# Patient Record
Sex: Male | Born: 1975 | Race: White | Hispanic: No | Marital: Married | Smoking: Current every day smoker
Health system: Southern US, Community
[De-identification: ages and names within clinical notes are randomized; demographics above are authoritative.]

## PROBLEM LIST (undated history)

## (undated) DIAGNOSIS — F419 Anxiety disorder, unspecified: Secondary | ICD-10-CM

## (undated) DIAGNOSIS — D689 Coagulation defect, unspecified: Secondary | ICD-10-CM

## (undated) DIAGNOSIS — C801 Malignant (primary) neoplasm, unspecified: Secondary | ICD-10-CM

## (undated) DIAGNOSIS — Z5189 Encounter for other specified aftercare: Secondary | ICD-10-CM

## (undated) DIAGNOSIS — T7840XA Allergy, unspecified, initial encounter: Secondary | ICD-10-CM

## (undated) DIAGNOSIS — I499 Cardiac arrhythmia, unspecified: Secondary | ICD-10-CM

## (undated) DIAGNOSIS — M1990 Unspecified osteoarthritis, unspecified site: Secondary | ICD-10-CM

## (undated) DIAGNOSIS — F32A Depression, unspecified: Secondary | ICD-10-CM

## (undated) DIAGNOSIS — G709 Myoneural disorder, unspecified: Secondary | ICD-10-CM

## (undated) DIAGNOSIS — E618 Deficiency of other specified nutrient elements: Secondary | ICD-10-CM

## (undated) DIAGNOSIS — I10 Essential (primary) hypertension: Secondary | ICD-10-CM

## (undated) DIAGNOSIS — F4310 Post-traumatic stress disorder, unspecified: Secondary | ICD-10-CM

## (undated) DIAGNOSIS — I480 Paroxysmal atrial fibrillation: Secondary | ICD-10-CM

## (undated) HISTORY — DX: Myoneural disorder, unspecified: G70.9

## (undated) HISTORY — DX: Malignant (primary) neoplasm, unspecified: C80.1

## (undated) HISTORY — PX: FRACTURE SURGERY: SHX138

## (undated) HISTORY — DX: Paroxysmal atrial fibrillation: I48.0

## (undated) HISTORY — DX: Coagulation defect, unspecified: D68.9

## (undated) HISTORY — PX: SPLENECTOMY, TOTAL: SHX788

## (undated) HISTORY — PX: JOINT REPLACEMENT: SHX530

## (undated) HISTORY — DX: Cardiac arrhythmia, unspecified: I49.9

## (undated) HISTORY — DX: Unspecified osteoarthritis, unspecified site: M19.90

## (undated) HISTORY — DX: Deficiency of other specified nutrient elements: E61.8

## (undated) HISTORY — DX: Encounter for other specified aftercare: Z51.89

## (undated) HISTORY — DX: Depression, unspecified: F32.A

## (undated) HISTORY — DX: Allergy, unspecified, initial encounter: T78.40XA

## (undated) HISTORY — DX: Post-traumatic stress disorder, unspecified: F43.10

## (undated) HISTORY — DX: Essential (primary) hypertension: I10

## (undated) HISTORY — PX: SPINAL FUSION: SHX223

## (undated) HISTORY — PX: APPENDECTOMY: SHX54

## (undated) HISTORY — PX: EYE SURGERY: SHX253

---

## 2020-06-22 ENCOUNTER — Emergency Department
Admission: EM | Admit: 2020-06-22 | Discharge: 2020-06-22 | Disposition: A | Discharge status: Home / Self Care | Payer: Medicaid Other | Attending: Emergency Medicine | Admitting: Emergency Medicine

## 2020-06-22 ENCOUNTER — Emergency Department: Payer: Medicaid Other

## 2020-06-22 ENCOUNTER — Other Ambulatory Visit: Payer: Self-pay

## 2020-06-22 DIAGNOSIS — M79662 Pain in left lower leg: Secondary | ICD-10-CM | POA: Diagnosis present

## 2020-06-22 DIAGNOSIS — F17200 Nicotine dependence, unspecified, uncomplicated: Secondary | ICD-10-CM | POA: Diagnosis not present

## 2020-06-22 DIAGNOSIS — F1210 Cannabis abuse, uncomplicated: Secondary | ICD-10-CM | POA: Diagnosis not present

## 2020-06-22 DIAGNOSIS — M722 Plantar fascial fibromatosis: Secondary | ICD-10-CM

## 2020-06-22 HISTORY — DX: Anxiety disorder, unspecified: F41.9

## 2020-06-22 MED ORDER — MELOXICAM 15 MG PO TABS: 15.0000 mg | ORAL_TABLET | Freq: Every day | ORAL | 0 refills | Status: DC

## 2020-06-22 MED ORDER — CYCLOBENZAPRINE HCL 10 MG PO TABS
10.0000 mg | ORAL_TABLET | Freq: Once | ORAL | Status: AC
  Administered 2020-06-22: 10 mg via ORAL
  Filled 2020-06-22: qty 1

## 2020-06-22 MED ORDER — NICOTINE 14 MG/24HR TD PT24
14.0000 mg | MEDICATED_PATCH | Freq: Once | TRANSDERMAL | Status: DC
  Administered 2020-06-22: 14 mg via TRANSDERMAL
  Filled 2020-06-22: qty 1

## 2020-06-22 MED ORDER — KETOROLAC TROMETHAMINE 30 MG/ML IJ SOLN
30.0000 mg | Freq: Once | INTRAMUSCULAR | Status: AC
  Administered 2020-06-22: 30 mg via INTRAMUSCULAR
  Filled 2020-06-22: qty 1

## 2020-06-22 MED ORDER — CYCLOBENZAPRINE HCL 10 MG PO TABS: 5.0000 mg | ORAL_TABLET | Freq: Three times a day (TID) | ORAL | 0 refills | Status: DC | PRN

## 2020-06-22 NOTE — ED Provider Notes (Signed)
[facility] Emergency Department Provider Note ____________________________________________  Time seen: Approximately 11:37 AM  I have reviewed the triage vital signs and the nursing notes.   HISTORY  Chief Complaint Foot Pain    HPI Patrick Short is a 44 y.o. male who presents to the emergency department for evaluation and treatment of left lower extremity pain.  No new injury.  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  Patient states that he has been septic 3 times after having had last surgery several years ago.  He states that for the past 2 days he has had to bear weight on his tiptoes and has been unable to plant his foot and walk normally.  Patient states that he has not taken any medications.  He says that he was on opiates for several years and it took him "3 years" to get off of them.  He denies fever, chills, erythema.   Past Medical History:  Diagnosis Date  . Anxiety     There are no problems to display for this patient.   History reviewed. No pertinent surgical history.  Prior to Admission medications   Medication Sig Start Date End Date Taking? Authorizing Provider  cyclobenzaprine (FLEXERIL) 10 MG tablet Take 0.5 tablets (5 mg total) by mouth 3 (three) times daily as needed for muscle spasms. 06/22/20   Madox Corkins, Rulon Eisenmenger B, FNP  meloxicam (MOBIC) 15 MG tablet Take 1 tablet (15 mg total) by mouth daily. 06/22/20   Shantrice Rodenberg, Rulon Eisenmenger B, FNP    Allergies Aspirin, Sulfa antibiotics, and Penicillins  No family history on file.  Social History Social History   Tobacco Use  . Smoking status: Current Every Day Smoker  . Smokeless tobacco: Never Used  Substance Use Topics  . Alcohol use: Not Currently  . Drug use: Yes    Types: Marijuana    Review of Systems Constitutional: Negative for fever. Cardiovascular: Negative for chest pain. Respiratory:  Negative for shortness of breath. Musculoskeletal: Positive for left lower extremity pain. Skin: Negative for open wounds Neurological: Negative for decrease in sensation  ____________________________________________   PHYSICAL EXAM:  VITAL SIGNS: ED Triage Vitals [06/22/20 1004]  Enc Vitals Group     BP (!) 160/97     Pulse Rate 100     Resp 18     Temp 98 F (36.7 C)     Temp src      SpO2 100 %     Weight 150 lb (68 kg)     Height 5\' 7"  (1.702 m)     Head Circumference      Peak Flow      Pain Score 10     Pain Loc      Pain Edu?      Excl. in GC?     Constitutional: Alert and oriented. Well appearing and in no acute distress. Eyes: Conjunctivae are clear without discharge or drainage Head: Atraumatic Neck: Supple. Respiratory: No cough. Respirations are even and unlabored. Musculoskeletal: Limited extension of the left lower extremity at the knee secondary to pain.  No deformity of the left lower extremity.  Full range of motion of the left ankle demonstrated.  Toes of the left foot without deformity or decrease in range of motion. Neurologic: Motor and sensory function is intact.  Strength is 5 out of 5 in the lower extremities. Skin: No swelling, erythema, focal tenderness over the  left lower extremity. Psychiatric: Affect and behavior are appropriate.  ____________________________________________   LABS (all labs ordered are listed, but only abnormal results are displayed)  Labs Reviewed - No data to display ____________________________________________  RADIOLOGY  Image of the left femur and tibia/fibula are negative for acute findings.  Image of the left foot does show a heel spur but otherwise negative for anything acute.  Ultrasound of the left lower extremity is negative for DVT.  I, Kem Boroughs, personally viewed and evaluated these images (plain radiographs) as part of my medical decision making, as well as reviewing the written report by the  radiologist.  DG Tibia/Fibula Left  Result Date: 06/22/2020 CLINICAL DATA:  Acute on chronic left leg pain EXAM: LEFT TIBIA AND FIBULA - 2 VIEW; LEFT FEMUR 2 VIEWS COMPARISON:  None. FINDINGS: There is no evidence of fracture or other focal bone lesions. Joint spaces of the hip, knee, and ankle are preserved. Soft tissues are unremarkable. IMPRESSION: Negative. Electronically Signed   By: Duanne Guess D.O.   On: 06/22/2020 12:04   US Venous Img Lower Unilateral Left  Result Date: 06/22/2020 CLINICAL DATA:  Calf pain EXAM: Left LOWER EXTREMITY VENOUS DOPPLER ULTRASOUND TECHNIQUE: Gray-scale sonography with compression, as well as color and duplex ultrasound, were performed to evaluate the deep venous system(s) from the level of the common femoral vein through the popliteal and proximal calf veins. COMPARISON:  None. FINDINGS: VENOUS Normal compressibility of the common femoral, superficial femoral, and popliteal veins, as well as the visualized calf veins. Visualized portions of profunda femoral vein and great saphenous vein unremarkable. No filling defects to suggest DVT on grayscale or color Doppler imaging. Doppler waveforms show normal direction of venous flow, normal respiratory plasticity and response to augmentation. Limited views of the contralateral common femoral vein are unremarkable. IMPRESSION: Negative. Electronically Signed   By: Feliberto Harts MD   On: 06/22/2020 12:20   DG Foot Complete Left  Result Date: 06/22/2020 CLINICAL DATA:  Pain with weight-bearing. EXAM: LEFT FOOT - COMPLETE 3+ VIEW COMPARISON:  No prior. FINDINGS: No acute bony or joint abnormality. No evidence of fracture or dislocation. Tiny benign-appearing cyst noted in the central portion of the calcaneus on lateral view. No radiopaque foreign body. IMPRESSION: No acute abnormality. Electronically Signed   By: Maisie Fus  Register   On: 06/22/2020 13:12   DG Femur Min 2 Views Left  Result Date: 06/22/2020 CLINICAL  DATA:  Acute on chronic left leg pain EXAM: LEFT TIBIA AND FIBULA - 2 VIEW; LEFT FEMUR 2 VIEWS COMPARISON:  None. FINDINGS: There is no evidence of fracture or other focal bone lesions. Joint spaces of the hip, knee, and ankle are preserved. Soft tissues are unremarkable. IMPRESSION: Negative. Electronically Signed   By: Duanne Guess D.O.   On: 06/22/2020 12:04   ____________________________________________   PROCEDURES  Procedures  ____________________________________________   INITIAL IMPRESSION / ASSESSMENT AND PLAN / ED COURSE  Patrick Short is a 44 y.o. who presents to the emergency department for treatment and evaluation of left lower extremity pain.  See HPI for further details.    Plan today will be to get plain films as well as ultrasound. 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 He does not want any type of opiate but would like to try toradol.  Differential diagnosis includes but is not limited to: DVT, osteomyelitis, musculoskeletal strain, plantar fasciitis, stress fracture.  Images are all reassuring.  He does have a small heel spur which may be causing some plantar fasciitis.  He has no signs of sepsis today.  Normotensive, not tachypneic, SPO2 above 95, heart rate under 100.  He did improve with the Toradol.  Plan will be to submit prescriptions for meloxicam and Flexeril to his pharmacy.  Cam walker applied.  He will be encouraged to follow-up with podiatry.  Medications  nicotine (NICODERM CQ - dosed in mg/24 hours) patch 14 mg (14 mg Transdermal Patch Applied 06/22/20 1241)  ketorolac (TORADOL) 30 MG/ML injection 30 mg (30 mg Intramuscular Given 06/22/20 1226)  cyclobenzaprine (FLEXERIL) tablet 10 mg (10 mg Oral Given 06/22/20 1337)    Pertinent labs & imaging results that were available during my care of the patient were reviewed by me and considered in my medical decision making (see chart for  details).   _________________________________________   FINAL CLINICAL IMPRESSION(S) / ED DIAGNOSES  Final diagnoses:  Plantar fasciitis, left    ED Discharge Orders         Ordered    cyclobenzaprine (FLEXERIL) 10 MG tablet  3 times daily PRN        06/22/20 1343    meloxicam (MOBIC) 15 MG tablet  Daily        06/22/20 1343           If controlled substance prescribed during this visit, 12 month history viewed on the [facility] prior to issuing an initial prescription for Schedule II or III opiod.   Chinita Pester, FNP 06/22/20 1356    Gilles Chiquito, MD 06/22/20 [phone]

## 2020-06-22 NOTE — ED Triage Notes (Signed)
Pt comes via POV from home with c/o left foot and heel pain. Pt states this started 2 days ago.

## 2020-06-22 NOTE — Discharge Instructions (Signed)
Please follow up with podiatry.   Take the medication as prescribed.

## 2020-11-19 ENCOUNTER — Emergency Department: Payer: Medicaid Other

## 2020-11-19 ENCOUNTER — Emergency Department
Admission: EM | Admit: 2020-11-19 | Discharge: 2020-11-19 | Disposition: A | Discharge status: Home / Self Care | Payer: Medicaid Other | Attending: Emergency Medicine | Admitting: Emergency Medicine

## 2020-11-19 ENCOUNTER — Other Ambulatory Visit: Payer: Self-pay

## 2020-11-19 DIAGNOSIS — M5442 Lumbago with sciatica, left side: Secondary | ICD-10-CM | POA: Insufficient documentation

## 2020-11-19 DIAGNOSIS — F17200 Nicotine dependence, unspecified, uncomplicated: Secondary | ICD-10-CM | POA: Insufficient documentation

## 2020-11-19 DIAGNOSIS — M5441 Lumbago with sciatica, right side: Secondary | ICD-10-CM | POA: Diagnosis not present

## 2020-11-19 MED ORDER — METHOCARBAMOL 750 MG PO TABS: 750.0000 mg | ORAL_TABLET | Freq: Four times a day (QID) | ORAL | 0 refills | Status: AC | PRN

## 2020-11-19 MED ORDER — KETOROLAC TROMETHAMINE 60 MG/2ML IM SOLN
30.0000 mg | Freq: Once | INTRAMUSCULAR | Status: AC
  Administered 2020-11-19: 30 mg via INTRAMUSCULAR
  Filled 2020-11-19: qty 2

## 2020-11-19 MED ORDER — ACETAMINOPHEN 325 MG PO TABS
650.0000 mg | ORAL_TABLET | Freq: Once | ORAL | Status: AC
  Administered 2020-11-19: 650 mg via ORAL
  Filled 2020-11-19: qty 2

## 2020-11-19 MED ORDER — METHOCARBAMOL 500 MG PO TABS
750.0000 mg | ORAL_TABLET | Freq: Once | ORAL | Status: AC
  Administered 2020-11-19: 750 mg via ORAL
  Filled 2020-11-19: qty 2
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
## 2020-11-19 NOTE — ED Notes (Signed)
See triage note  Presents with lower back pain  States pain started last pm  Denies any injury  States pain is moving into both legs  Ambulates slowly d/t increased apin

## 2020-11-19 NOTE — Discharge Instructions (Addendum)
Take tylenol up to 4x daily as needed for pain. Use robaxin as prescribed. Do not drive or operate machinery on this medication.

## 2020-11-19 NOTE — ED Triage Notes (Addendum)
Pt c/o lower back pain that radiates into BL LE since last night, denies any injury. Pt is ambulatory to triage with a steady gait. Pt states he is suppose to be In court today at 1pm and is wanting up to fax a note.

## 2021-01-08 ENCOUNTER — Encounter: Payer: Self-pay | Admitting: Nurse Practitioner

## 2021-01-08 ENCOUNTER — Other Ambulatory Visit: Payer: Self-pay

## 2021-01-08 ENCOUNTER — Ambulatory Visit (INDEPENDENT_AMBULATORY_CARE_PROVIDER_SITE_OTHER): Payer: Medicaid Other | Admitting: Nurse Practitioner

## 2021-01-08 VITALS — BP 158/90 | HR 78 | Ht 65.55 in | Wt 173.4 lb

## 2021-01-08 DIAGNOSIS — F419 Anxiety disorder, unspecified: Secondary | ICD-10-CM

## 2021-01-08 DIAGNOSIS — F3289 Other specified depressive episodes: Secondary | ICD-10-CM | POA: Diagnosis not present

## 2021-01-08 DIAGNOSIS — I10 Essential (primary) hypertension: Secondary | ICD-10-CM

## 2021-01-08 DIAGNOSIS — Z720 Tobacco use: Secondary | ICD-10-CM

## 2021-01-08 DIAGNOSIS — Z7689 Persons encountering health services in other specified circumstances: Secondary | ICD-10-CM | POA: Diagnosis not present

## 2021-01-08 MED ORDER — VALSARTAN-HYDROCHLOROTHIAZIDE 80-12.5 MG PO TABS: 1.0000 | ORAL_TABLET | Freq: Every day | ORAL | 0 refills | Status: DC
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
## 2021-01-09 DIAGNOSIS — F32A Depression, unspecified: Secondary | ICD-10-CM | POA: Insufficient documentation

## 2021-01-09 DIAGNOSIS — F419 Anxiety disorder, unspecified: Secondary | ICD-10-CM | POA: Insufficient documentation

## 2021-01-09 DIAGNOSIS — I10 Essential (primary) hypertension: Secondary | ICD-10-CM | POA: Insufficient documentation

## 2021-01-09 NOTE — Assessment & Plan Note (Signed)
Chronic.  Will discuss smoking cessation at next visit. Return in one month.

## 2021-01-09 NOTE — Assessment & Plan Note (Signed)
New Problem. Recommend starting Valsartan-HCTZ daily to help control blood pressure.  Continue to track blood pressures at home and bring log to next appointment.  Return in 1 month.

## 2021-01-09 NOTE — Assessment & Plan Note (Signed)
Controlled. Will possibly start Wellbutrin at future visits to help with Depression/Anxiety and smoking cessation.  Can return sooner if symptoms worsen.  Denies SI.  Return in 1 month for reevaluation.

## 2021-01-09 NOTE — Assessment & Plan Note (Signed)
Controlled.  Continue with anxiety management techniques. Will possibly try Wellbutrin in the future to help with smoker cessation and Anxiety/Depression.  Return in 1 month for reevaluation.

## 2021-01-14 DIAGNOSIS — Z20822 Contact with and (suspected) exposure to covid-19: Secondary | ICD-10-CM | POA: Diagnosis not present

## 2021-01-16 ENCOUNTER — Telehealth (INDEPENDENT_AMBULATORY_CARE_PROVIDER_SITE_OTHER): Payer: Medicaid Other | Admitting: Nurse Practitioner

## 2021-01-16 ENCOUNTER — Other Ambulatory Visit: Payer: Self-pay

## 2021-01-16 ENCOUNTER — Encounter: Payer: Self-pay | Admitting: Nurse Practitioner

## 2021-01-16 ENCOUNTER — Other Ambulatory Visit: Payer: Self-pay | Admitting: Internal Medicine

## 2021-01-16 ENCOUNTER — Telehealth: Payer: Self-pay | Admitting: Nurse Practitioner

## 2021-01-16 DIAGNOSIS — J069 Acute upper respiratory infection, unspecified: Secondary | ICD-10-CM | POA: Diagnosis not present

## 2021-01-16 MED ORDER — HYDROCOD POLST-CPM POLST ER 10-8 MG/5ML PO SUER
5.0000 mL | Freq: Two times a day (BID) | ORAL | 0 refills | Status: DC | PRN
Start: 2021-01-16 — End: 2021-01-28

## 2021-01-16 MED ORDER — GUAIFENESIN-CODEINE 100-10 MG/5ML PO SOLN: 5.0000 mL | Freq: Two times a day (BID) | ORAL | 0 refills | Status: DC | PRN

## 2021-01-16 MED ORDER — GUAIFENESIN-CODEINE 100-10 MG/5ML PO SOLN: 5.0000 mL | Freq: Four times a day (QID) | ORAL | 0 refills | Status: DC | PRN

## 2021-01-16 NOTE — Progress Notes (Signed)
There were no vitals taken for this visit.   Subjective:    Patient ID: Patrick Short, male    DOB: Oct 01, 1976, 45 y.o.   MRN: [id number]  HPI: Patrick Short is a 45 y.o. male  Chief Complaint  Patient presents with  . covid symptoms    Severe congestion, cough, hot and cold flashes   UPPER RESPIRATORY TRACT INFECTION COVID test was negative on Monday Worst symptom: congestion, cough, hot and cold flashes Fever: no Cough: yes Shortness of breath: yes from cough Wheezing: yes Chest pain: yes, with cough Chest tightness: no Chest congestion: yes Nasal congestion: yes Runny nose: yes Post nasal drip: yes Sneezing: no Sore throat: yes Swollen glands: no Sinus pressure: no Headache: yes Face pain: no Toothache: no Ear pain: no bilateral Ear pressure: no bilateral Eyes red/itching:no Eye drainage/crusting: no  Vomiting: yes Rash: no Fatigue: yes Sick contacts: no Strep contacts: no  Context: stable Recurrent sinusitis: no Relief with OTC cold/cough medications: no  Treatments attempted: benadruyl, nyquil, dayquil, tylneol/motrin   Relevant past medical, surgical, family and social history reviewed and updated as indicated. Interim medical history since our last visit reviewed. Allergies and medications reviewed and updated.  Review of Systems  Constitutional: Positive for fatigue. Negative for fever.  HENT: Positive for congestion, postnasal drip and rhinorrhea. Negative for ear pain, sinus pressure, sinus pain, sneezing and sore throat.   Respiratory: Positive for cough, shortness of breath and wheezing. Negative for chest tightness.   Gastrointestinal: Positive for vomiting.  Skin: Negative for rash.  Neurological: Negative for headaches.    Per HPI unless specifically indicated above     Objective:    There were no vitals taken for this visit.  Wt Readings from Last 3 Encounters:  01/08/21 173 lb 6 oz (78.6 kg)  11/19/20 160 lb (72.6 kg)  06/22/20 150  lb (68 kg)    Physical Exam Vitals and nursing note reviewed.  Constitutional:      General: He is not in acute distress.    Appearance: He is not ill-appearing.  HENT:     Head: Normocephalic.     Right Ear: Hearing normal.     Left Ear: Hearing normal.     Nose: Nose normal.  Pulmonary:     Effort: Pulmonary effort is normal. No respiratory distress.  Neurological:     Mental Status: He is alert.  Psychiatric:        Mood and Affect: Mood normal.        Behavior: Behavior normal.        Thought Content: Thought content normal.        Judgment: Judgment normal.     No results found for this or any previous visit.    Assessment & Plan:   Problem List Items Addressed This Visit   None   Visit Diagnoses    Upper respiratory tract infection, unspecified type    -  Primary   Retest patient on Friday for COVID per the recommendations of health dept. Continue with symptom management. Discussed s/s of when to seek higher level of care.   Relevant Orders   Novel Coronavirus, NAA ([facility])       Follow up plan: Return if symptoms worsen or fail to improve.     This visit was completed via MyChart due to the restrictions of the COVID-19 pandemic. All issues as above were discussed and addressed. Physical exam was done as above through visual confirmation on MyChart.  If it was felt that the patient should be evaluated in the office, they were directed there. The patient verbally consented to this visit. 1. Location of the patient: Home 2. Location of the provider: Office 3. Those involved with this call:  ? Provider: Larae Grooms, NP ? CMA: Tiffany Reel, CMA ? Front Desk/Registration: Harriet Pho 4. Time spent on call: 15 minutes with patient face to face via video conference. More than 50% of this time was spent in counseling and coordination of care. 20 minutes total spent in review of patient's record and preparation of their chart.

## 2021-01-16 NOTE — Progress Notes (Signed)
Sent in tussionex for pt , seen Larae Grooms for an URTI

## 2021-01-16 NOTE — Telephone Encounter (Signed)
Orders placed.

## 2021-01-18 ENCOUNTER — Other Ambulatory Visit: Payer: Medicaid Other

## 2021-01-18 DIAGNOSIS — J069 Acute upper respiratory infection, unspecified: Secondary | ICD-10-CM | POA: Diagnosis not present

## 2021-01-19 LAB — SARS-COV-2, NAA 2 DAY TAT

## 2021-01-19 LAB — NOVEL CORONAVIRUS, NAA: SARS-CoV-2, NAA: NOT DETECTED

## 2021-01-21 NOTE — Progress Notes (Signed)
Hi Patrick Short.  Your COVID test is negative.  Please let me know if you have any questions.

## 2021-01-25 ENCOUNTER — Telehealth: Payer: Self-pay | Admitting: Nurse Practitioner

## 2021-01-25 NOTE — Telephone Encounter (Signed)
Pt states that he spoke with PCP at last appt regarding the medication Wellbutrin. He states that he would like to have a prescription to help with his anxiety and to help him with smoking. He also states that he is having restless legs and is requesting to have a prescription for that. Please advise .      [id number] - [place], [place] - [address]  [address] [place] [address]  Phone: [phone] Fax: [phone]  Hours: Not open 24 hours

## 2021-01-28 ENCOUNTER — Encounter: Payer: Self-pay | Admitting: Nurse Practitioner

## 2021-01-28 ENCOUNTER — Ambulatory Visit (INDEPENDENT_AMBULATORY_CARE_PROVIDER_SITE_OTHER): Payer: Medicaid Other | Admitting: Nurse Practitioner

## 2021-01-28 ENCOUNTER — Other Ambulatory Visit: Payer: Self-pay

## 2021-01-28 VITALS — BP 126/86 | HR 82 | Temp 97.6°F | Wt 170.0 lb

## 2021-01-28 DIAGNOSIS — G2581 Restless legs syndrome: Secondary | ICD-10-CM

## 2021-01-28 DIAGNOSIS — I10 Essential (primary) hypertension: Secondary | ICD-10-CM

## 2021-01-28 MED ORDER — ROPINIROLE HCL 0.5 MG PO TABS: 0.5000 mg | ORAL_TABLET | Freq: Every day | ORAL | 0 refills | Status: DC

## 2021-01-28 NOTE — Telephone Encounter (Signed)
Please advise pt seen 4/6

## 2021-01-28 NOTE — Telephone Encounter (Signed)
Patient is due to come in at the end of the month for follow up.  We can discuss this at that time or he can come in sooner for an appointment.

## 2021-01-28 NOTE — Progress Notes (Signed)
BP 126/86   Pulse 82   Temp 97.6 F (36.4 C)   Wt 170 lb (77.1 kg) Comment: Patient reported  SpO2 98%   BMI 27.82 kg/m    Subjective:    Patient ID: Patrick Short, male    DOB: 09/15/1976, 45 y.o.   MRN: [id number]  HPI: Patrick Short is a 45 y.o. male  Chief Complaint  Patient presents with  . Anxiety  . Hypertension  . Nicotine Dependence  . restless leg    Patient states that he has been having a lot of issues over the weekend   INSOMNIA  Patient states that he notices that his legs are jumping a lot.  He states it is messing with him mentally.  Duration: years Satisfied with sleep quality: no Difficulty falling asleep: yes Difficulty staying asleep: yes Waking a few hours after sleep onset: yes Early morning awakenings: usually falls asleep in the early morning. Daytime hypersomnolence: yes Wakes feeling refreshed: no Good sleep hygiene: no Apnea: no Snoring: unsure Depressed/anxious mood: yes Recent stress: no Restless legs/nocturnal leg cramps: yes Chronic pain/arthritis: yes History of sleep study: several years ago possibly 2015/2016 Treatments attempted: benadryl   HYPERTENSION Patient has been checking blood pressures at home. States that are running in the 120s/80s.  Denies side effects from medication.  Denies HA, CP, SOB, dizziness, palpitations, visual changes, and lower extremity swelling.   Relevant past medical, surgical, family and social history reviewed and updated as indicated. Interim medical history since our last visit reviewed. Allergies and medications reviewed and updated.  Review of Systems  Eyes: Negative for visual disturbance.  Respiratory: Negative for shortness of breath.   Cardiovascular: Negative for chest pain and leg swelling.  Neurological: Negative for light-headedness and headaches.       Feels like legs are jumping  Psychiatric/Behavioral: Positive for decreased concentration and sleep disturbance. The patient is  nervous/anxious.     Per HPI unless specifically indicated above     Objective:    BP 126/86   Pulse 82   Temp 97.6 F (36.4 C)   Wt 170 lb (77.1 kg) Comment: Patient reported  SpO2 98%   BMI 27.82 kg/m   Wt Readings from Last 3 Encounters:  01/28/21 170 lb (77.1 kg)  01/08/21 173 lb 6 oz (78.6 kg)  11/19/20 160 lb (72.6 kg)    Physical Exam Vitals and nursing note reviewed.  Constitutional:      General: He is not in acute distress.    Appearance: Normal appearance. He is not ill-appearing, toxic-appearing or diaphoretic.  HENT:     Head: Normocephalic.     Right Ear: External ear normal.     Left Ear: External ear normal.     Nose: Nose normal. No congestion or rhinorrhea.     Mouth/Throat:     Mouth: Mucous membranes are moist.  Eyes:     General:        Right eye: No discharge.        Left eye: No discharge.     Extraocular Movements: Extraocular movements intact.     Conjunctiva/sclera: Conjunctivae normal.     Pupils: Pupils are equal, round, and reactive to light.  Cardiovascular:     Rate and Rhythm: Normal rate and regular rhythm.     Heart sounds: No murmur heard.   Pulmonary:     Effort: Pulmonary effort is normal. No respiratory distress.     Breath sounds: Normal breath sounds. No  wheezing, rhonchi or rales.  Abdominal:     General: Abdomen is flat. Bowel sounds are normal.  Musculoskeletal:     Cervical back: Normal range of motion and neck supple.  Skin:    General: Skin is warm and dry.     Capillary Refill: Capillary refill takes less than 2 seconds.  Neurological:     General: No focal deficit present.     Mental Status: He is alert and oriented to person, place, and time.  Psychiatric:        Mood and Affect: Mood normal.        Behavior: Behavior normal.        Thought Content: Thought content normal.        Judgment: Judgment normal.     Results for orders placed or performed in visit on 01/18/21  Novel Coronavirus, NAA ([facility])    Specimen: Saline  Result Value Ref Range   SARS-CoV-2, NAA Not Detected Not Detected  SARS-COV-2, NAA 2 DAY TAT  Result Value Ref Range   SARS-CoV-2, NAA 2 DAY TAT Performed       Assessment & Plan:   Problem List Items Addressed This Visit      Cardiovascular and Mediastinum   Hypertension    Chronic.  Controlled.  Continue with current medication regimen.  Return to clinic in 2 weeks.  Will draw labs at next visit.         Other   Restless leg syndrome - Primary    Begin Requip nightly to help with Restless leg symptoms. Discussed side effects and benefits of medication with patient during visit.  Can increase dose at next visit if necessary.  Can add Mirapex to requip if patient needs additional relief. Return to clinic in two weeks for reevaluation.           Follow up plan: Return in about 2 weeks (around 02/11/2021) for Restless Leg.

## 2021-01-28 NOTE — Assessment & Plan Note (Signed)
Chronic.  Controlled.  Continue with current medication regimen.  Return to clinic in 2 weeks.  Will draw labs at next visit.

## 2021-01-28 NOTE — Telephone Encounter (Signed)
Called pt scheduled him for 1:00 today he states that he is unable to sleep as well

## 2021-01-28 NOTE — Assessment & Plan Note (Addendum)
Begin Requip nightly to help with Restless leg symptoms. Discussed side effects and benefits of medication with patient during visit.  Can increase dose at next visit if necessary.  Can add Mirapex to requip if patient needs additional relief. Return to clinic in two weeks for reevaluation.

## 2021-01-30 ENCOUNTER — Other Ambulatory Visit: Payer: Self-pay | Admitting: Nurse Practitioner

## 2021-02-04 ENCOUNTER — Ambulatory Visit: Payer: Self-pay

## 2021-02-04 NOTE — Telephone Encounter (Signed)
BP med started 1 mo ago. BP WNL then. Saturday in direct sunlight - 2 hours fishing. Hot and dizzy while in sun. Came home. Last night BP 13?/92. Diaphoretic. BP 108/94. More active today and ran errands.    Latest BP: 108/94 just before call. -During triage call:126/92. No dizziness. Is drinking water.Says feels very thirsty.  Advised pt to check BP every day and prn when having symptoms. Advised to call office if noticing fluctuations in BP.   Advised to continue to drink fluids to have the urine a clear or a pale yellow.  Advised to call 911 for weakness/numbness of the face arms or legs, any blurred or double vision.

## 2021-02-05 NOTE — Telephone Encounter (Signed)
Patient notified

## 2021-02-05 NOTE — Telephone Encounter (Signed)
Recommend patient make sure he is well hydrated with at least 64 ounces of water daily.  If he is out in the sun he should increase his water intake.  Continue to check blood pressures at home daily.  Keep a log and bring to next visit so we can discuss.  If patient is light headed, dizzy or blurred vision he should return to the clinic sooner.

## 2021-02-08 ENCOUNTER — Ambulatory Visit: Payer: Medicaid Other | Admitting: Nurse Practitioner

## 2021-02-15 NOTE — Progress Notes (Signed)
BP 128/85   Pulse 67   Temp 98 F (36.7 C)   Wt 172 lb 4 oz (78.1 kg)   SpO2 97%   BMI 28.18 kg/m    Subjective:    Patient ID: Patrick Short, male    DOB: [birth year], 45 y.o.   MRN: [id number]  HPI: Patrick Short is a 45 y.o. male  Chief Complaint  Patient presents with  . restless leg   . Labs Only   INSOMNIA  Patient states his sleep pattern has improved with the Requip.  It is better than it has bene in years. Patient is going to sleep around 11-12 and getting up around 6-7. Duration: years Satisfied with sleep quality: no Difficulty falling asleep: yes Difficulty staying asleep: yes Waking a few hours after sleep onset: yes Early morning awakenings: usually falls asleep in the early morning. Daytime hypersomnolence: yes Wakes feeling refreshed: no Good sleep hygiene: no Apnea: no Snoring: unsure Depressed/anxious mood: yes Recent stress: no Restless legs/nocturnal leg cramps: yes Chronic pain/arthritis: yes History of sleep study: several years ago possibly 2015/2016 Treatments attempted: benadryl   HYPERTENSION Patient has been checking blood pressures at home. States that are running in the 120s/80s.  Patient states he got a little dizzy when he was outside.  He is being more mindful of being on the medication. Denies HA, CP, SOB, dizziness, palpitations, visual changes, and lower extremity swelling in office today.  DEPRESSION/ANXIETY Patient has been on Klonopin from the [place] but it is not what he wants to be on.  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  He does not want to be on any addictive medications.  Is willing to do therapy.  Denies SI.   Relevant past medical, surgical, family and social history reviewed and updated as indicated. Interim medical history since our last visit reviewed. Allergies and medications reviewed and updated.  Review of Systems  Eyes: Negative for visual disturbance.  Respiratory: Negative  for shortness of breath.   Cardiovascular: Negative for chest pain and leg swelling.  Neurological: Negative for light-headedness and headaches.       Feels like legs are jumping  Psychiatric/Behavioral: Positive for decreased concentration, dysphoric mood and sleep disturbance. Negative for suicidal ideas. The patient is nervous/anxious.     Per HPI unless specifically indicated above     Objective:    BP 128/85   Pulse 67   Temp 98 F (36.7 C)   Wt 172 lb 4 oz (78.1 kg)   SpO2 97%   BMI 28.18 kg/m   Wt Readings from Last 3 Encounters:  02/18/21 172 lb 4 oz (78.1 kg)  01/28/21 170 lb (77.1 kg)  01/08/21 173 lb 6 oz (78.6 kg)    Physical Exam Vitals and nursing note reviewed.  Constitutional:      General: He is not in acute distress.    Appearance: Normal appearance. He is not ill-appearing, toxic-appearing or diaphoretic.  HENT:     Head: Normocephalic.     Right Ear: External ear normal.     Left Ear: External ear normal.     Nose: Nose normal. No congestion or rhinorrhea.     Mouth/Throat:     Mouth: Mucous membranes are moist.  Eyes:     General:        Right eye: No discharge.        Left eye: No discharge.     Extraocular Movements: Extraocular movements  intact.     Conjunctiva/sclera: Conjunctivae normal.     Pupils: Pupils are equal, round, and reactive to light.  Cardiovascular:     Rate and Rhythm: Normal rate and regular rhythm.     Heart sounds: No murmur heard.   Pulmonary:     Effort: Pulmonary effort is normal. No respiratory distress.     Breath sounds: Normal breath sounds. No wheezing, rhonchi or rales.  Abdominal:     General: Abdomen is flat. Bowel sounds are normal.  Musculoskeletal:     Cervical back: Normal range of motion and neck supple.  Skin:    General: Skin is warm and dry.     Capillary Refill: Capillary refill takes less than 2 seconds.  Neurological:     General: No focal deficit present.     Mental Status: He is alert and  oriented to person, place, and time.  Psychiatric:        Mood and Affect: Mood normal.        Behavior: Behavior normal.        Thought Content: Thought content normal.        Judgment: Judgment normal.     Results for orders placed or performed in visit on 01/18/21  Novel Coronavirus, NAA ([facility])   Specimen: Saline  Result Value Ref Range   SARS-CoV-2, NAA Not Detected Not Detected  SARS-COV-2, NAA 2 DAY TAT  Result Value Ref Range   SARS-CoV-2, NAA 2 DAY TAT Performed       Assessment & Plan:   Problem List Items Addressed This Visit      Cardiovascular and Mediastinum   Hypertension - Primary    Chronic.  Controlled.  Slightly elevated at visit today, however, patient's anxiety is worse today. Will continue with current medication regimen.   Follow up in 1 month for reevaluation.      Relevant Medications   valsartan-hydrochlorothiazide (DIOVAN-HCT) 80-12.5 MG tablet   Other Relevant Orders   CBC with Differential/Platelet   Comprehensive metabolic panel   Urinalysis, Routine w reflex microscopic     Other   Depression    Chronic.  Not well controlled.  Begin Wellbutrin 100mg  daily.  Side effects and benefits of medication discussed with patient at visit today.   Denies SI.  Referral placed for therapy. Return in 1 month.      Relevant Medications   buPROPion (WELLBUTRIN SR) 100 MG 12 hr tablet   Other Relevant Orders   CBC with Differential/Platelet   Comprehensive metabolic panel   Ambulatory referral to Psychology   Anxiety    Chronic.  Not well controlled.  Begin Wellbutrin 100mg  daily.  Side effects and benefits of medication discussed with patient at visit today.  Denies SI.  Referral placed for therapy.  Return in 1 month.      Relevant Medications   buPROPion (WELLBUTRIN SR) 100 MG 12 hr tablet   Other Relevant Orders   TSH   Ambulatory referral to Psychology   Restless leg syndrome    Chronic.  Controlled.  Continue with Requip.  Follow up in 1  month.      Relevant Orders   CBC with Differential/Platelet   Comprehensive metabolic panel    Other Visit Diagnoses    Screening for ischemic heart disease       Relevant Orders   Lipid panel   Screening for prostate cancer       Relevant Orders   PSA  Follow up plan: Return in about 1 month (around 03/21/2021) for Depression/Anxiety FU.

## 2021-02-18 ENCOUNTER — Encounter: Payer: Self-pay | Admitting: Psychology

## 2021-02-18 ENCOUNTER — Encounter: Payer: Self-pay | Admitting: Nurse Practitioner

## 2021-02-18 ENCOUNTER — Ambulatory Visit (INDEPENDENT_AMBULATORY_CARE_PROVIDER_SITE_OTHER): Payer: Medicaid Other | Admitting: Nurse Practitioner

## 2021-02-18 ENCOUNTER — Other Ambulatory Visit: Payer: Self-pay

## 2021-02-18 VITALS — BP 128/85 | HR 67 | Temp 98.0°F | Wt 172.2 lb

## 2021-02-18 DIAGNOSIS — F3289 Other specified depressive episodes: Secondary | ICD-10-CM

## 2021-02-18 DIAGNOSIS — Z125 Encounter for screening for malignant neoplasm of prostate: Secondary | ICD-10-CM | POA: Diagnosis not present

## 2021-02-18 DIAGNOSIS — Z136 Encounter for screening for cardiovascular disorders: Secondary | ICD-10-CM | POA: Diagnosis not present

## 2021-02-18 DIAGNOSIS — I10 Essential (primary) hypertension: Secondary | ICD-10-CM

## 2021-02-18 DIAGNOSIS — G2581 Restless legs syndrome: Secondary | ICD-10-CM

## 2021-02-18 DIAGNOSIS — F419 Anxiety disorder, unspecified: Secondary | ICD-10-CM

## 2021-02-18 LAB — URINALYSIS, ROUTINE W REFLEX MICROSCOPIC
Bilirubin, UA: NEGATIVE
Glucose, UA: NEGATIVE
Ketones, UA: NEGATIVE
Leukocytes,UA: NEGATIVE
Nitrite, UA: NEGATIVE
Protein,UA: NEGATIVE
RBC, UA: NEGATIVE
Specific Gravity, UA: 1.02 (ref 1.005–1.030)
Urobilinogen, Ur: 0.2 mg/dL (ref 0.2–1.0)
pH, UA: 7 (ref 5.0–7.5)

## 2021-02-18 MED ORDER — BUPROPION HCL ER (SR) 100 MG PO TB12: 100.0000 mg | ORAL_TABLET | Freq: Every day | ORAL | 0 refills | Status: DC

## 2021-02-18 MED ORDER — VALSARTAN-HYDROCHLOROTHIAZIDE 80-12.5 MG PO TABS: 1.0000 | ORAL_TABLET | Freq: Every day | ORAL | 1 refills | Status: DC

## 2021-02-18 MED ORDER — ROPINIROLE HCL 0.5 MG PO TABS: 0.5000 mg | ORAL_TABLET | Freq: Every day | ORAL | 1 refills | Status: DC

## 2021-02-18 NOTE — Assessment & Plan Note (Signed)
Chronic.  Controlled.  Slightly elevated at visit today, however, patient's anxiety is worse today. Will continue with current medication regimen.   Follow up in 1 month for reevaluation.

## 2021-02-18 NOTE — Assessment & Plan Note (Addendum)
Chronic.  Not well controlled.  Begin Wellbutrin 100mg  daily.  Side effects and benefits of medication discussed with patient at visit today.   Denies SI.  Referral placed for therapy. Return in 1 month.

## 2021-02-18 NOTE — Assessment & Plan Note (Signed)
Chronic.  Controlled.  Continue with Requip.  Follow up in 1 month.

## 2021-02-18 NOTE — Assessment & Plan Note (Addendum)
Chronic.  Not well controlled.  Begin Wellbutrin 100mg daily.  Side effects and benefits of medication discussed with patient at visit today.   Denies SI.  Referral placed for therapy. Return in 1 month. 

## 2021-02-18 NOTE — Progress Notes (Signed)
Hi Patrick Short.  Just wanted to let you know that your urine sample was normal.  I will send you another note with the rest of your blood work.

## 2021-02-19 LAB — CBC WITH DIFFERENTIAL/PLATELET
Basophils Absolute: 0.1 10*3/uL (ref 0.0–0.2)
Basos: 1 %
EOS (ABSOLUTE): 0 10*3/uL (ref 0.0–0.4)
Eos: 1 %
Hematocrit: 50.4 % (ref 37.5–51.0)
Hemoglobin: 17.6 g/dL (ref 13.0–17.7)
Immature Grans (Abs): 0 10*3/uL (ref 0.0–0.1)
Immature Granulocytes: 0 %
Lymphocytes Absolute: 1.6 10*3/uL (ref 0.7–3.1)
Lymphs: 22 %
MCH: 33.3 pg — ABNORMAL HIGH (ref 26.6–33.0)
MCHC: 34.9 g/dL (ref 31.5–35.7)
MCV: 95 fL (ref 79–97)
Monocytes Absolute: 0.8 10*3/uL (ref 0.1–0.9)
Monocytes: 10 %
Neutrophils Absolute: 5 10*3/uL (ref 1.4–7.0)
Neutrophils: 66 %
Platelets: 234 10*3/uL (ref 150–450)
RBC: 5.29 x10E6/uL (ref 4.14–5.80)
RDW: 12.3 % (ref 11.6–15.4)
WBC: 7.5 10*3/uL (ref 3.4–10.8)

## 2021-02-19 LAB — COMPREHENSIVE METABOLIC PANEL
ALT: 31 IU/L (ref 0–44)
AST: 31 IU/L (ref 0–40)
Albumin/Globulin Ratio: 1.6 (ref 1.2–2.2)
Albumin: 4.7 g/dL (ref 4.0–5.0)
Alkaline Phosphatase: 76 IU/L (ref 44–121)
BUN/Creatinine Ratio: 13 (ref 9–20)
BUN: 12 mg/dL (ref 6–24)
Bilirubin Total: 0.4 mg/dL (ref 0.0–1.2)
CO2: 23 mmol/L (ref 20–29)
Calcium: 9.7 mg/dL (ref 8.7–10.2)
Chloride: 97 mmol/L (ref 96–106)
Creatinine, Ser: 0.96 mg/dL (ref 0.76–1.27)
Globulin, Total: 3 g/dL (ref 1.5–4.5)
Glucose: 91 mg/dL (ref 65–99)
Potassium: 4.1 mmol/L (ref 3.5–5.2)
Sodium: 140 mmol/L (ref 134–144)
Total Protein: 7.7 g/dL (ref 6.0–8.5)
eGFR: 99 mL/min/{1.73_m2} (ref >59)

## 2021-02-19 LAB — PSA: Prostate Specific Ag, Serum: 0.8 ng/mL (ref 0.0–4.0)

## 2021-02-19 LAB — LIPID PANEL
Chol/HDL Ratio: 5.7 ratio — ABNORMAL HIGH (ref 0.0–5.0)
Cholesterol, Total: 198 mg/dL (ref 100–199)
HDL: 35 mg/dL — ABNORMAL LOW (ref >39)
LDL Chol Calc (NIH): 139 mg/dL — ABNORMAL HIGH (ref 0–99)
Triglycerides: 133 mg/dL (ref 0–149)
VLDL Cholesterol Cal: 24 mg/dL (ref 5–40)

## 2021-02-19 LAB — TSH: TSH: 1.26 u[IU]/mL (ref 0.450–4.500)

## 2021-02-19 NOTE — Progress Notes (Signed)
Hi Patrick Short. Overall your lab work looks good.  Thyroid, Prostate, liver, kidneys, and electrolytes are all within normal range.  Your cholesterol is high, I recommend you make lifestyle changes. Your LDL is above normal. The LDL is the bad cholesterol. Over time and in combination with inflammation and other factors, this contributes to plaque which in turn may lead to stroke and/or heart attack down the road. Sometimes high LDL is primarily genetic, and people might be eating all the right foods but still have high numbers. Other times, there is room for improvement in one's diet and eating healthier can bring this number down and potentially reduce one's risk of heart attack and/or stroke.   To reduce your LDL, Remember - more fruits and vegetables, more fish, and limit red meat and dairy products. More soy, nuts, beans, barley, lentils, oats and plant sterol ester enriched margarine instead of butter. I also encourage eliminating sugar and processed food. Remember, shop on the outside of the grocery store and visit your [employer]. We should recheck your cholesterol in 3-6 months.

## 2021-03-08 DIAGNOSIS — M545 Low back pain, unspecified: Secondary | ICD-10-CM | POA: Diagnosis not present

## 2021-03-12 ENCOUNTER — Other Ambulatory Visit: Payer: Self-pay | Admitting: Nurse Practitioner

## 2021-03-12 ENCOUNTER — Telehealth: Payer: Self-pay | Admitting: Nurse Practitioner

## 2021-03-12 MED ORDER — BUPROPION HCL ER (XL) 150 MG PO TB24: 150.0000 mg | ORAL_TABLET | Freq: Every day | ORAL | 0 refills | Status: DC

## 2021-03-12 NOTE — Telephone Encounter (Signed)
Pt called and stated that he would like to change the Rx buPROPion (WELLBUTRIN SR) 100 MG 12 hr tablet [[id number]] Patient states that he feels like nothing is happening and would like to up it. Please advise

## 2021-03-12 NOTE — Telephone Encounter (Signed)
Spoke with patient and notified him of new Rx sent over to his local pharmacy. Advised patient to pick up new Rx started and then when he comes in for his scheduled follow up. Larae Grooms, NP will discuss the next recommended steps. Patient verbalized understanding and has no further questions.

## 2021-03-12 NOTE — Telephone Encounter (Signed)
Medication changed to Wellbutrin 150mg  extended release.

## 2021-03-12 NOTE — Telephone Encounter (Signed)
Pt last apt on 02/18/2021 next on 03/21/2021

## 2021-03-12 NOTE — Telephone Encounter (Signed)
   Notes to clinic:  Patient requesting a 90 day supply Review for change    Requested Prescriptions  Pending Prescriptions Disp Refills   buPROPion (WELLBUTRIN SR) 100 MG 12 hr tablet [Pharmacy Med Name: BUPROPION HCL SR 100 MG TABLET] 90 tablet 1    Sig: TAKE 1 TABLET BY MOUTH EVERY DAY      Psychiatry: Antidepressants - bupropion Passed - 03/12/2021  8:32 AM      Passed - Completed PHQ-2 or PHQ-9 in the last 360 days      Passed - Last BP in normal range    BP Readings from Last 1 Encounters:  02/18/21 128/85          Passed - Valid encounter within last 6 months    Recent Outpatient Visits           3 weeks ago Primary hypertension   [facility] Larae Grooms, NP   1 month ago Restless leg syndrome   [facility] Larae Grooms, NP   1 month ago Upper respiratory tract infection, unspecified type   [facility] Larae Grooms, NP   2 months ago Encounter to establish care   [facility] Larae Grooms, NP       Future Appointments             In 1 week Vella Kohler Lesia Sago, PsyD [facility] [place] [facility], [facility]   In 1 week Larae Grooms, NP [facility]

## 2021-03-14 DIAGNOSIS — H5213 Myopia, bilateral: Secondary | ICD-10-CM | POA: Diagnosis not present

## 2021-03-19 ENCOUNTER — Other Ambulatory Visit: Payer: Self-pay

## 2021-03-19 ENCOUNTER — Encounter: Payer: Self-pay | Admitting: Psychology

## 2021-03-19 ENCOUNTER — Encounter: Payer: Medicaid Other | Attending: Psychology | Admitting: Psychology

## 2021-03-19 ENCOUNTER — Ambulatory Visit: Payer: Medicaid Other | Admitting: Psychology

## 2021-03-19 DIAGNOSIS — G2581 Restless legs syndrome: Secondary | ICD-10-CM | POA: Insufficient documentation

## 2021-03-19 DIAGNOSIS — F418 Other specified anxiety disorders: Secondary | ICD-10-CM | POA: Insufficient documentation

## 2021-03-19 DIAGNOSIS — G90523 Complex regional pain syndrome I of lower limb, bilateral: Secondary | ICD-10-CM

## 2021-03-19 DIAGNOSIS — F4312 Post-traumatic stress disorder, chronic: Secondary | ICD-10-CM | POA: Insufficient documentation
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
## 2021-03-20 DIAGNOSIS — G8929 Other chronic pain: Secondary | ICD-10-CM | POA: Insufficient documentation

## 2021-03-20 DIAGNOSIS — E7800 Pure hypercholesterolemia, unspecified: Secondary | ICD-10-CM | POA: Insufficient documentation

## 2021-03-20 DIAGNOSIS — F4310 Post-traumatic stress disorder, unspecified: Secondary | ICD-10-CM | POA: Insufficient documentation

## 2021-03-20 NOTE — Progress Notes (Signed)
BP (!) 135/93   Pulse 69   Temp 98.2 F (36.8 C)   Wt 172 lb (78 kg)   SpO2 98%   BMI 28.14 kg/m    Subjective:    Patient ID: Patrick Short, male    DOB: 12-Dec-1975, 45 y.o.   MRN: [id number]  HPI: Patrick Short is a 45 y.o. male  Chief Complaint  Patient presents with   Anxiety   Depression   DEPRESSION/ANXIETY Patient feels like he is doing better.  He states his anxiety is better controlled.  Believes he is still experiencing some anxiety.  Denies SI. Believes he would benefit from an increased dose.  He has seen psychology which was very beneficial for him. Psychologist recommended he receive a Ketamine infusion due to his anxiety, PTSD, and chronic pain.  This would be done through [facility] where he has a contact. Patient feels like he is doing well at this time.   RESTLESS LEG SYNDROME Patient feels like his restless leg is well controlled.  He is sleeping better now than he has in many years.  Feels like this is the right dose for him.    Denies HA, CP, SOB, dizziness, palpitations, visual changes, and lower extremity swelling.   Relevant past medical, surgical, family and social history reviewed and updated as indicated. Interim medical history since our last visit reviewed. Allergies and medications reviewed and updated.  Review of Systems  Eyes:  Negative for visual disturbance.  Respiratory:  Negative for shortness of breath.   Cardiovascular:  Negative for chest pain and leg swelling.  Neurological:  Negative for light-headedness and headaches.  Psychiatric/Behavioral:  Negative for dysphoric mood, sleep disturbance and suicidal ideas. The patient is nervous/anxious.    Per HPI unless specifically indicated above     Objective:    BP (!) 135/93   Pulse 69   Temp 98.2 F (36.8 C)   Wt 172 lb (78 kg)   SpO2 98%   BMI 28.14 kg/m   Wt Readings from Last 3 Encounters:  03/21/21 172 lb (78 kg)  02/18/21 172 lb 4 oz (78.1 kg)  01/28/21 170 lb (77.1 kg)     Physical Exam Vitals and nursing note reviewed.  Constitutional:      General: He is not in acute distress.    Appearance: Normal appearance. He is not ill-appearing, toxic-appearing or diaphoretic.  HENT:     Head: Normocephalic.     Right Ear: External ear normal.     Left Ear: External ear normal.     Nose: Nose normal. No congestion or rhinorrhea.     Mouth/Throat:     Mouth: Mucous membranes are moist.  Eyes:     General:        Right eye: No discharge.        Left eye: No discharge.     Extraocular Movements: Extraocular movements intact.     Conjunctiva/sclera: Conjunctivae normal.     Pupils: Pupils are equal, round, and reactive to light.  Cardiovascular:     Rate and Rhythm: Normal rate and regular rhythm.     Heart sounds: No murmur heard. Pulmonary:     Effort: Pulmonary effort is normal. No respiratory distress.     Breath sounds: Normal breath sounds. No wheezing, rhonchi or rales.  Abdominal:     General: Abdomen is flat. Bowel sounds are normal.  Musculoskeletal:     Cervical back: Normal range of motion and neck supple.  Skin:    General: Skin is warm and dry.     Capillary Refill: Capillary refill takes less than 2 seconds.  Neurological:     General: No focal deficit present.     Mental Status: He is alert and oriented to person, place, and time.  Psychiatric:        Mood and Affect: Mood normal.        Behavior: Behavior normal.        Thought Content: Thought content normal.        Judgment: Judgment normal.    Results for orders placed or performed in visit on 02/18/21  TSH  Result Value Ref Range   TSH 1.260 0.450 - 4.500 uIU/mL  PSA  Result Value Ref Range   Prostate Specific Ag, Serum 0.8 0.0 - 4.0 ng/mL  Lipid panel  Result Value Ref Range   Cholesterol, Total 198 100 - 199 mg/dL   Triglycerides 133 0 - 149 mg/dL   HDL 35 (L) >39 mg/dL   VLDL Cholesterol Cal 24 5 - 40 mg/dL   LDL Chol Calc (NIH) 139 (H) 0 - 99 mg/dL   Chol/HDL  Ratio 5.7 (H) 0.0 - 5.0 ratio  CBC with Differential/Platelet  Result Value Ref Range   WBC 7.5 3.4 - 10.8 x10E3/uL   RBC 5.29 4.14 - 5.80 x10E6/uL   Hemoglobin 17.6 13.0 - 17.7 g/dL   Hematocrit 50.4 37.5 - 51.0 %   MCV 95 79 - 97 fL   MCH 33.3 (H) 26.6 - 33.0 pg   MCHC 34.9 31.5 - 35.7 g/dL   RDW 12.3 11.6 - 15.4 %   Platelets 234 150 - 450 x10E3/uL   Neutrophils 66 Not Estab. %   Lymphs 22 Not Estab. %   Monocytes 10 Not Estab. %   Eos 1 Not Estab. %   Basos 1 Not Estab. %   Neutrophils Absolute 5.0 1.4 - 7.0 x10E3/uL   Lymphocytes Absolute 1.6 0.7 - 3.1 x10E3/uL   Monocytes Absolute 0.8 0.1 - 0.9 x10E3/uL   EOS (ABSOLUTE) 0.0 0.0 - 0.4 x10E3/uL   Basophils Absolute 0.1 0.0 - 0.2 x10E3/uL   Immature Granulocytes 0 Not Estab. %   Immature Grans (Abs) 0.0 0.0 - 0.1 x10E3/uL  Comprehensive metabolic panel  Result Value Ref Range   Glucose 91 65 - 99 mg/dL   BUN 12 6 - 24 mg/dL   Creatinine, Ser 0.96 0.76 - 1.27 mg/dL   eGFR 99 >59 mL/min/1.73   BUN/Creatinine Ratio 13 9 - 20   Sodium 140 134 - 144 mmol/L   Potassium 4.1 3.5 - 5.2 mmol/L   Chloride 97 96 - 106 mmol/L   CO2 23 20 - 29 mmol/L   Calcium 9.7 8.7 - 10.2 mg/dL   Total Protein 7.7 6.0 - 8.5 g/dL   Albumin 4.7 4.0 - 5.0 g/dL   Globulin, Total 3.0 1.5 - 4.5 g/dL   Albumin/Globulin Ratio 1.6 1.2 - 2.2   Bilirubin Total 0.4 0.0 - 1.2 mg/dL   Alkaline Phosphatase 76 44 - 121 IU/L   AST 31 0 - 40 IU/L   ALT 31 0 - 44 IU/L  Urinalysis, Routine w reflex microscopic  Result Value Ref Range   Specific Gravity, UA 1.020 1.005 - 1.030   pH, UA 7.0 5.0 - 7.5   Color, UA Yellow Yellow   Appearance Ur Clear Clear   Leukocytes,UA Negative Negative   Protein,UA Negative Negative/Trace   Glucose, UA Negative Negative  Ketones, UA Negative Negative   RBC, UA Negative Negative   Bilirubin, UA Negative Negative   Urobilinogen, Ur 0.2 0.2 - 1.0 mg/dL   Nitrite, UA Negative Negative      Assessment & Plan:   Problem  List Items Addressed This Visit       Cardiovascular and Mediastinum   Hypertension - Primary    Chronic.  Ongoing.  Still elevated.  Will increase Valsartan to 162m daily.  Continue with the HCTZ at 12.59m  Follow up in 3 months for follow up.        Relevant Medications   valsartan-hydrochlorothiazide (DIOVAN-HCT) 160-12.5 MG tablet     Other   Depression    Chronic.  Improving.  Wellbutrin has improved symptoms.  Feels like psychology was very beneficial.  Hoping the Ketamine infusion is approved by insurance. Did agree that increasing Wellbutrin would be beneficial.  Increased to Wellbutrin 30058maily.  Discussed side effects and benefits of medication. Follow up in 3 months.  Call sooner if concerns arise.       Relevant Medications   buPROPion (WELLBUTRIN XL) 300 MG 24 hr tablet   Anxiety    Chronic.  Improving.  Wellbutrin has improved symptoms.  Feels like psychology was very beneficial.  Hoping the Ketamine infusion is approved by insurance. Did agree that increasing Wellbutrin would be beneficial.  Increased to Wellbutrin 300m51mily.  Discussed side effects and benefits of medication. Follow up in 3 months.  Call sooner if concerns arise.       Relevant Medications   buPROPion (WELLBUTRIN XL) 300 MG 24 hr tablet   Restless leg syndrome    Chronic.  Controlled.  Patient is sleeping well and less fidgety. Reports sleeping about 8 hours a night.  Follow up in 3 months.  Call sooner if concerns arise.          Follow up plan: Return in about 3 months (around 06/21/2021) for Depression/Anxiety FU, BP Check.   A total of 30 minutes were spent on this encounter today.  When total time is documented, this includes both the face-to-face and non-face-to-face time personally spent before, during and after the visit on the date of the encounter.

## 2021-03-21 ENCOUNTER — Other Ambulatory Visit: Payer: Self-pay

## 2021-03-21 ENCOUNTER — Ambulatory Visit (INDEPENDENT_AMBULATORY_CARE_PROVIDER_SITE_OTHER): Payer: Medicaid Other | Admitting: Nurse Practitioner

## 2021-03-21 ENCOUNTER — Encounter: Payer: Self-pay | Admitting: Nurse Practitioner

## 2021-03-21 VITALS — BP 135/93 | HR 69 | Temp 98.2°F | Wt 172.0 lb

## 2021-03-21 DIAGNOSIS — I10 Essential (primary) hypertension: Secondary | ICD-10-CM

## 2021-03-21 DIAGNOSIS — G2581 Restless legs syndrome: Secondary | ICD-10-CM

## 2021-03-21 DIAGNOSIS — F3289 Other specified depressive episodes: Secondary | ICD-10-CM | POA: Diagnosis not present

## 2021-03-21 DIAGNOSIS — F419 Anxiety disorder, unspecified: Secondary | ICD-10-CM | POA: Diagnosis not present

## 2021-03-21 MED ORDER — BUPROPION HCL ER (XL) 300 MG PO TB24: 300.0000 mg | ORAL_TABLET | Freq: Every day | ORAL | 1 refills | Status: DC

## 2021-03-21 MED ORDER — VALSARTAN-HYDROCHLOROTHIAZIDE 160-12.5 MG PO TABS: 1.0000 | ORAL_TABLET | Freq: Every day | ORAL | 1 refills | Status: DC

## 2021-03-21 NOTE — Assessment & Plan Note (Signed)
Chronic.  Ongoing.  Still elevated.  Will increase Valsartan to 160mg  daily.  Continue with the HCTZ at 12.5mg .  Follow up in 3 months for follow up.

## 2021-03-21 NOTE — Assessment & Plan Note (Signed)
Chronic.  Controlled.  Patient is sleeping well and less fidgety. Reports sleeping about 8 hours a night.  Follow up in 3 months.  Call sooner if concerns arise.

## 2021-03-21 NOTE — Assessment & Plan Note (Signed)
Chronic.  Improving.  Wellbutrin has improved symptoms.  Feels like psychology was very beneficial.  Hoping the Ketamine infusion is approved by insurance. Did agree that increasing Wellbutrin would be beneficial.  Increased to Wellbutrin 300mg daily.  Discussed side effects and benefits of medication. Follow up in 3 months.  Call sooner if concerns arise. 

## 2021-03-21 NOTE — Assessment & Plan Note (Signed)
Chronic.  Improving.  Wellbutrin has improved symptoms.  Feels like psychology was very beneficial.  Hoping the Ketamine infusion is approved by insurance. Did agree that increasing Wellbutrin would be beneficial.  Increased to Wellbutrin 300mg  daily.  Discussed side effects and benefits of medication. Follow up in 3 months.  Call sooner if concerns arise.

## 2021-06-10 ENCOUNTER — Other Ambulatory Visit: Payer: Self-pay

## 2021-06-10 ENCOUNTER — Encounter: Payer: Self-pay | Admitting: Nurse Practitioner

## 2021-06-10 ENCOUNTER — Ambulatory Visit (INDEPENDENT_AMBULATORY_CARE_PROVIDER_SITE_OTHER): Payer: Medicaid Other | Admitting: Nurse Practitioner

## 2021-06-10 VITALS — BP 121/79 | HR 79 | Temp 98.7°F | Ht 65.55 in | Wt 173.4 lb

## 2021-06-10 DIAGNOSIS — F3289 Other specified depressive episodes: Secondary | ICD-10-CM | POA: Diagnosis not present

## 2021-06-10 DIAGNOSIS — F419 Anxiety disorder, unspecified: Secondary | ICD-10-CM | POA: Diagnosis not present

## 2021-06-10 DIAGNOSIS — I10 Essential (primary) hypertension: Secondary | ICD-10-CM | POA: Diagnosis not present

## 2021-06-10 MED ORDER — SERTRALINE HCL 25 MG PO TABS: 25.0000 mg | ORAL_TABLET | Freq: Every day | ORAL | 0 refills | Status: DC

## 2021-06-10 MED ORDER — ROPINIROLE HCL 1 MG PO TABS: 1.0000 mg | ORAL_TABLET | Freq: Every day | ORAL | 1 refills | Status: DC

## 2021-06-10 NOTE — Progress Notes (Signed)
BP 121/79   Pulse 79   Temp 98.7 F (37.1 C)   Ht 5' 5.55" (1.665 m)   Wt 173 lb 6 oz (78.6 kg)   SpO2 97%   BMI 28.37 kg/m    Subjective:    Patient ID: Patrick Short, male    DOB: 08/07/1976, 45 y.o.   MRN: [id number]  HPI: Patrick Short is a 45 y.o. male  Chief Complaint  Patient presents with   Hypertension   Anxiety    Social anxiety, patient states that it is more in public areas.    Depression   restless leg    Patient would like his prescription increased, he states that he has been taking 2 at night   HYPERTENSION Hypertension status: stable  Satisfied with current treatment? yes Duration of hypertension: years BP monitoring frequency:  daily BP range: 120/70 BP medication side effects:  no Medication compliance: excellent compliance Previous BP meds:valsartan-HCTZ Aspirin: no Recurrent headaches: no Visual changes: no Palpitations: no Dyspnea: no Chest pain: no Lower extremity edema: no Dizzy/lightheaded: no  ANXIETY/DEPRESSION Patient states that he is having social anxiety.  When he is around other people he has noticed that his anxiety is worsening. Patient states his grandfather passed away recently.  Denies SI. [place] [facility] Visit from 06/10/2021 in [facility]  PHQ-9 Total Score 6      GAD 7 : Generalized Anxiety Score 06/10/2021 03/21/2021 01/28/2021 01/08/2021  Nervous, Anxious, on Edge '3 1 1 1  ' Control/stop worrying 0 1 0 0  Worry too much - different things 0 1 0 1  Trouble relaxing '3 1 2 2  ' Restless 3 2 0 2  Easily annoyed or irritable '1 1 1 1  ' Afraid - awful might happen 0 0 0 0  Total GAD 7 Score '10 7 4 7  ' Anxiety Difficulty Extremely difficult Somewhat difficult Somewhat difficult Not difficult at all   RESTLESS LEG Patient states he has been taking two of the Requip. Patient felt like the .61m was working then it stopped.  That is when he started taking the 2 tabs which has been working well.   Relevant past  medical, surgical, family and social history reviewed and updated as indicated. Interim medical history since our last visit reviewed. Allergies and medications reviewed and updated.  Review of Systems  Neurological:        Restless leg  Psychiatric/Behavioral:  Positive for dysphoric mood. Negative for suicidal ideas. The patient is nervous/anxious.    Per HPI unless specifically indicated above     Objective:    BP 121/79   Pulse 79   Temp 98.7 F (37.1 C)   Ht 5' 5.55" (1.665 m)   Wt 173 lb 6 oz (78.6 kg)   SpO2 97%   BMI 28.37 kg/m   Wt Readings from Last 3 Encounters:  06/10/21 173 lb 6 oz (78.6 kg)  03/21/21 172 lb (78 kg)  02/18/21 172 lb 4 oz (78.1 kg)    Physical Exam Vitals and nursing note reviewed.  Constitutional:      General: He is not in acute distress.    Appearance: Normal appearance. He is not ill-appearing, toxic-appearing or diaphoretic.  HENT:     Head: Normocephalic.     Right Ear: External ear normal.     Left Ear: External ear normal.     Nose: Nose normal. No congestion or rhinorrhea.     Mouth/Throat:     Mouth: Mucous  membranes are moist.  Eyes:     General:        Right eye: No discharge.        Left eye: No discharge.     Extraocular Movements: Extraocular movements intact.     Conjunctiva/sclera: Conjunctivae normal.     Pupils: Pupils are equal, round, and reactive to light.  Cardiovascular:     Rate and Rhythm: Normal rate and regular rhythm.     Heart sounds: No murmur heard. Pulmonary:     Effort: Pulmonary effort is normal. No respiratory distress.     Breath sounds: Normal breath sounds. No wheezing, rhonchi or rales.  Abdominal:     General: Abdomen is flat. Bowel sounds are normal.  Musculoskeletal:     Cervical back: Normal range of motion and neck supple.  Skin:    General: Skin is warm and dry.     Capillary Refill: Capillary refill takes less than 2 seconds.  Neurological:     General: No focal deficit present.      Mental Status: He is alert and oriented to person, place, and time.  Psychiatric:        Mood and Affect: Mood normal.        Behavior: Behavior normal.        Thought Content: Thought content normal.        Judgment: Judgment normal.    Results for orders placed or performed in visit on 02/18/21  TSH  Result Value Ref Range   TSH 1.260 0.450 - 4.500 uIU/mL  PSA  Result Value Ref Range   Prostate Specific Ag, Serum 0.8 0.0 - 4.0 ng/mL  Lipid panel  Result Value Ref Range   Cholesterol, Total 198 100 - 199 mg/dL   Triglycerides 133 0 - 149 mg/dL   HDL 35 (L) >39 mg/dL   VLDL Cholesterol Cal 24 5 - 40 mg/dL   LDL Chol Calc (NIH) 139 (H) 0 - 99 mg/dL   Chol/HDL Ratio 5.7 (H) 0.0 - 5.0 ratio  CBC with Differential/Platelet  Result Value Ref Range   WBC 7.5 3.4 - 10.8 x10E3/uL   RBC 5.29 4.14 - 5.80 x10E6/uL   Hemoglobin 17.6 13.0 - 17.7 g/dL   Hematocrit 50.4 37.5 - 51.0 %   MCV 95 79 - 97 fL   MCH 33.3 (H) 26.6 - 33.0 pg   MCHC 34.9 31.5 - 35.7 g/dL   RDW 12.3 11.6 - 15.4 %   Platelets 234 150 - 450 x10E3/uL   Neutrophils 66 Not Estab. %   Lymphs 22 Not Estab. %   Monocytes 10 Not Estab. %   Eos 1 Not Estab. %   Basos 1 Not Estab. %   Neutrophils Absolute 5.0 1.4 - 7.0 x10E3/uL   Lymphocytes Absolute 1.6 0.7 - 3.1 x10E3/uL   Monocytes Absolute 0.8 0.1 - 0.9 x10E3/uL   EOS (ABSOLUTE) 0.0 0.0 - 0.4 x10E3/uL   Basophils Absolute 0.1 0.0 - 0.2 x10E3/uL   Immature Granulocytes 0 Not Estab. %   Immature Grans (Abs) 0.0 0.0 - 0.1 x10E3/uL  Comprehensive metabolic panel  Result Value Ref Range   Glucose 91 65 - 99 mg/dL   BUN 12 6 - 24 mg/dL   Creatinine, Ser 0.96 0.76 - 1.27 mg/dL   eGFR 99 >59 mL/min/1.73   BUN/Creatinine Ratio 13 9 - 20   Sodium 140 134 - 144 mmol/L   Potassium 4.1 3.5 - 5.2 mmol/L   Chloride 97 96 - 106 mmol/L  CO2 23 20 - 29 mmol/L   Calcium 9.7 8.7 - 10.2 mg/dL   Total Protein 7.7 6.0 - 8.5 g/dL   Albumin 4.7 4.0 - 5.0 g/dL   Globulin, Total  3.0 1.5 - 4.5 g/dL   Albumin/Globulin Ratio 1.6 1.2 - 2.2   Bilirubin Total 0.4 0.0 - 1.2 mg/dL   Alkaline Phosphatase 76 44 - 121 IU/L   AST 31 0 - 40 IU/L   ALT 31 0 - 44 IU/L  Urinalysis, Routine w reflex microscopic  Result Value Ref Range   Specific Gravity, UA 1.020 1.005 - 1.030   pH, UA 7.0 5.0 - 7.5   Color, UA Yellow Yellow   Appearance Ur Clear Clear   Leukocytes,UA Negative Negative   Protein,UA Negative Negative/Trace   Glucose, UA Negative Negative   Ketones, UA Negative Negative   RBC, UA Negative Negative   Bilirubin, UA Negative Negative   Urobilinogen, Ur 0.2 0.2 - 1.0 mg/dL   Nitrite, UA Negative Negative      Assessment & Plan:   Problem List Items Addressed This Visit       Cardiovascular and Mediastinum   Hypertension    Chronic.  Controlled.  Continue with current medication regimen. Return to clinic in 6 months for reevaluation.  Call sooner if concerns arise.          Other   Depression    Chronic. Uncontrolled.  Will add Zoloft 67m to the Wellbutrin 3043mdaily.  Discussed side effects and benefits of medication with patient during visit today.  Can increase to 2 tabs daily if tolerating the medication well after 2 weeks.  Follow up in 1 month for reevaluation.       Relevant Medications   sertraline (ZOLOFT) 25 MG tablet   Anxiety - Primary    Chronic. Uncontrolled.  Will add Zoloft 2562mo the Wellbutrin 300m31mily.  Discussed side effects and benefits of medication with patient during visit today.  Can increase to 2 tabs daily if tolerating the medication well after 2 weeks.  Follow up in 1 month for reevaluation.       Relevant Medications   sertraline (ZOLOFT) 25 MG tablet     Follow up plan: Return in about 1 month (around 07/11/2021) for Depression/Anxiety FU.

## 2021-06-10 NOTE — Assessment & Plan Note (Signed)
Chronic. Uncontrolled.  Will add Zoloft 25mg  to the Wellbutrin 300mg  daily.  Discussed side effects and benefits of medication with patient during visit today.  Can increase to 2 tabs daily if tolerating the medication well after 2 weeks.  Follow up in 1 month for reevaluation.

## 2021-06-10 NOTE — Assessment & Plan Note (Signed)
Chronic.  Controlled.  Continue with current medication regimen.  Return to clinic in 6 months for reevaluation.  Call sooner if concerns arise.  ? ?

## 2021-06-10 NOTE — Assessment & Plan Note (Signed)
Chronic. Uncontrolled.  Will add Zoloft 25mg to the Wellbutrin 300mg daily.  Discussed side effects and benefits of medication with patient during visit today.  Can increase to 2 tabs daily if tolerating the medication well after 2 weeks.  Follow up in 1 month for reevaluation.  

## 2021-06-15 ENCOUNTER — Other Ambulatory Visit: Payer: Self-pay | Admitting: Nurse Practitioner

## 2021-06-15 NOTE — Telephone Encounter (Signed)
Med dc'd (dose changed) 06/10/21 "reorder" Larae Grooms NP

## 2021-06-18 ENCOUNTER — Other Ambulatory Visit: Payer: Self-pay

## 2021-06-18 ENCOUNTER — Emergency Department: Payer: Medicaid Other

## 2021-06-18 ENCOUNTER — Emergency Department
Admission: EM | Admit: 2021-06-18 | Discharge: 2021-06-18 | Disposition: A | Discharge status: Home / Self Care | Payer: Medicaid Other | Attending: Emergency Medicine | Admitting: Emergency Medicine

## 2021-06-18 DIAGNOSIS — R0789 Other chest pain: Secondary | ICD-10-CM | POA: Diagnosis not present

## 2021-06-18 DIAGNOSIS — R0600 Dyspnea, unspecified: Secondary | ICD-10-CM | POA: Diagnosis not present

## 2021-06-18 DIAGNOSIS — I10 Essential (primary) hypertension: Secondary | ICD-10-CM | POA: Diagnosis not present

## 2021-06-18 DIAGNOSIS — Z79899 Other long term (current) drug therapy: Secondary | ICD-10-CM | POA: Diagnosis not present

## 2021-06-18 DIAGNOSIS — R000 Tachycardia, unspecified: Secondary | ICD-10-CM | POA: Diagnosis not present

## 2021-06-18 DIAGNOSIS — R7989 Other specified abnormal findings of blood chemistry: Secondary | ICD-10-CM | POA: Diagnosis not present

## 2021-06-18 DIAGNOSIS — F17210 Nicotine dependence, cigarettes, uncomplicated: Secondary | ICD-10-CM | POA: Diagnosis not present

## 2021-06-18 DIAGNOSIS — R0689 Other abnormalities of breathing: Secondary | ICD-10-CM | POA: Diagnosis not present

## 2021-06-18 DIAGNOSIS — R457 State of emotional shock and stress, unspecified: Secondary | ICD-10-CM | POA: Diagnosis not present

## 2021-06-18 DIAGNOSIS — E876 Hypokalemia: Secondary | ICD-10-CM

## 2021-06-18 DIAGNOSIS — R079 Chest pain, unspecified: Secondary | ICD-10-CM | POA: Diagnosis not present

## 2021-06-18 LAB — CBC
HCT: 45.7 % (ref 39.0–52.0)
Hemoglobin: 16.9 g/dL (ref 13.0–17.0)
MCH: 33.4 pg (ref 26.0–34.0)
MCHC: 37 g/dL — ABNORMAL HIGH (ref 30.0–36.0)
MCV: 90.3 fL (ref 80.0–100.0)
Platelets: 267 10*3/uL (ref 150–400)
RBC: 5.06 MIL/uL (ref 4.22–5.81)
RDW: 12.1 % (ref 11.5–15.5)
WBC: 8.6 10*3/uL (ref 4.0–10.5)
nRBC: 0 % (ref 0.0–0.2)

## 2021-06-18 LAB — BASIC METABOLIC PANEL
Anion gap: 7 (ref 5–15)
BUN: 17 mg/dL (ref 6–20)
CO2: 22 mmol/L (ref 22–32)
Calcium: 9.2 mg/dL (ref 8.9–10.3)
Chloride: 105 mmol/L (ref 98–111)
Creatinine, Ser: 1.11 mg/dL (ref 0.61–1.24)
GFR, Estimated: >60 mL/min (ref >60)
Glucose, Bld: 106 mg/dL — ABNORMAL HIGH (ref 70–99)
Potassium: 3.2 mmol/L — ABNORMAL LOW (ref 3.5–5.1)
Sodium: 134 mmol/L — ABNORMAL LOW (ref 135–145)

## 2021-06-18 LAB — TROPONIN I (HIGH SENSITIVITY)
Troponin I (High Sensitivity): 7 ng/L (ref <18)
Troponin I (High Sensitivity): 6 ng/L (ref <18)

## 2021-06-18 LAB — D-DIMER, QUANTITATIVE: D-Dimer, Quant: 0.57 ug/mL-FEU — ABNORMAL HIGH (ref 0.00–0.50)

## 2021-06-18 MED ORDER — IOHEXOL 350 MG/ML SOLN
75.0000 mL | Freq: Once | INTRAVENOUS | Status: AC | PRN
  Administered 2021-06-18: 75 mL via INTRAVENOUS

## 2021-06-18 MED ORDER — NAPROXEN 500 MG PO TABS
500.0000 mg | ORAL_TABLET | Freq: Once | ORAL | Status: AC
  Administered 2021-06-18: 500 mg via ORAL
  Filled 2021-06-18: qty 1

## 2021-06-18 MED ORDER — POTASSIUM CHLORIDE CRYS ER 20 MEQ PO TBCR
40.0000 meq | EXTENDED_RELEASE_TABLET | Freq: Once | ORAL | Status: AC
  Administered 2021-06-18: 40 meq via ORAL
  Filled 2021-06-18: qty 2

## 2021-06-18 MED ORDER — ACETAMINOPHEN 500 MG PO TABS
1000.0000 mg | ORAL_TABLET | Freq: Once | ORAL | Status: AC
  Administered 2021-06-18: 1000 mg via ORAL
  Filled 2021-06-18: qty 2

## 2021-06-18 NOTE — ED Notes (Signed)
Pt presents to ED with c/o of midsternum chest pain that does not radiate anywhere else. Pt denies cardiac HX other than hypertension. Pt is A&Ox4. NAD noted.

## 2021-06-18 NOTE — Discharge Instructions (Addendum)
Please take Tylenol and ibuprofen/Advil for your pain.  It is safe to take them together, or to alternate them every few hours.  Take up to 1000mg of Tylenol at a time, up to 4 times per day.  Do not take more than 4000 mg of Tylenol in 24 hours.  For ibuprofen, take 400-600 mg, 4-5 times per day. ° ° °

## 2021-06-18 NOTE — ED Notes (Signed)
ED Provider at bedside. 

## 2021-06-18 NOTE — ED Triage Notes (Signed)
Pt comes into the ED via EMS from home with c/o left sided chest pain , PVC's..  125/80 82-98HR 98%RA [id number]

## 2021-06-18 NOTE — ED Notes (Signed)
Patient transported to CT 

## 2021-06-18 NOTE — ED Provider Notes (Signed)
[facility] Emergency Department Provider Note ____________________________________________   Event Date/Time   First MD Initiated Contact with Patient 06/18/21 1504     (approximate)  I have reviewed the triage vital signs and the nursing notes.  HISTORY  Chief Complaint Chest Pain   HPI Patrick Short is a 45 y.o. malewho presents to the ED for evaluation of chest pain.  Chart review indicates PTSD and anxiety.  Tobacco abuse with 30-pack-year smoke history.  HTN.  Patient presents to the ED for the evaluation of chest pain.  Patient reports developing this pain around noon today while seated about to have lunch, suddenly developing left-sided sharp chest pains, progressing to substernal aching pressure sensation like he was struck in the chest with a hammer.  Reports he has been feeling the sensation constantly since that time, over the past 4 hours.  He reports associated mild dyspnea.  He does report pleuritic worsening of his pain.  Also reports associated nausea without emesis or abdominal pain.  Denies syncopal episodes, abdominal pain, fever, cough, hemoptysis  Past Medical History:  Diagnosis Date   Anxiety    Depression    Mineral deficiency    PTSD (post-traumatic stress disorder)     Patient Active Problem List   Diagnosis Date Noted   PTSD (post-traumatic stress disorder) 03/20/2021   Chronic pain 03/20/2021   Elevated LDL cholesterol level 03/20/2021   Restless leg syndrome 01/28/2021   Depression 01/09/2021   Anxiety 01/09/2021   Hypertension 01/09/2021   Tobacco abuse 01/08/2021    Past Surgical History:  Procedure Laterality Date   APPENDECTOMY     EYE SURGERY     laceration to pupil   FRACTURE SURGERY Left    Rod- which was replaced due to rejection of original rod   SPINAL FUSION     SPLENECTOMY, TOTAL      Prior to Admission medications   Medication Sig Start Date End Date Taking? Authorizing Provider  buPROPion  (WELLBUTRIN XL) 300 MG 24 hr tablet Take 1 tablet (300 mg total) by mouth daily. 03/21/21   Larae Grooms, NP  rOPINIRole (REQUIP) 1 MG tablet Take 1 tablet (1 mg total) by mouth at bedtime. 06/10/21   Larae Grooms, NP  sertraline (ZOLOFT) 25 MG tablet Take 1 tablet (25 mg total) by mouth daily. 06/10/21   Larae Grooms, NP  valsartan-hydrochlorothiazide (DIOVAN-HCT) 160-12.5 MG tablet Take 1 tablet by mouth daily. 03/21/21   Larae Grooms, NP    Allergies Aspirin, Bee venom, Sulfa antibiotics, and Penicillins  Family History  Problem Relation Age of Onset   Hypertension Mother    Hypertension Father    Heart disease Maternal Grandmother    Hypertension Maternal Grandmother    Heart disease Paternal Grandfather    Hypertension Paternal Grandfather    Other Son        Trisomy 69    Social History Social History   Tobacco Use   Smoking status: Every Day    Packs/day: 1.00    Years: 25.00    Pack years: 25.00    Types: Cigarettes   Smokeless tobacco: Never  Vaping Use   Vaping Use: Never used  Substance Use Topics   Alcohol use: Not Currently   Drug use: Yes    Types: Marijuana    Review of Systems  Constitutional: No fever/chills Eyes: No visual changes. ENT: No sore throat. Cardiovascular: Positive for chest pain. Respiratory: Positive for shortness of breath. Gastrointestinal: No abdominal pain.  no vomiting.  No diarrhea.  No constipation. Positive for nausea Genitourinary: Negative for dysuria. Musculoskeletal: Negative for back pain. Skin: Negative for rash. Neurological: Negative for focal weakness or numbness. ____________________________________________   PHYSICAL EXAM:  VITAL SIGNS: Vitals:   06/18/21 1541 06/18/21 1901  BP: (!) 133/97 130/90  Pulse: (!) 54 68  Resp: 16 15  Temp:    SpO2: 97% 98%    Constitutional: Alert and oriented. Well appearing and in no acute distress. Eyes: Conjunctivae are normal. PERRL. EOMI. Head:  Atraumatic. Nose: No congestion/rhinnorhea. Mouth/Throat: Mucous membranes are moist.  Oropharynx non-erythematous. Neck: No stridor. No cervical spine tenderness to palpation. Cardiovascular: Normal rate, regular rhythm. Grossly normal heart sounds.  Good peripheral circulation. Respiratory: Normal respiratory effort.  No retractions. Lungs CTAB. Gastrointestinal: Soft , nondistended, nontender to palpation. No CVA tenderness. Musculoskeletal: No lower extremity tenderness nor edema.  No joint effusions. No signs of acute trauma. Neurologic:  Normal speech and language. No gross focal neurologic deficits are appreciated. No gait instability noted. Skin:  Skin is warm, dry and intact. No rash noted. Psychiatric: Mood and affect are normal. Speech and behavior are normal. ____________________________________________   LABS (all labs ordered are listed, but only abnormal results are displayed)  Labs Reviewed  BASIC METABOLIC PANEL - Abnormal; Notable for the following components:      Result Value   Sodium 134 (*)    Potassium 3.2 (*)    Glucose, Bld 106 (*)    All other components within normal limits  CBC - Abnormal; Notable for the following components:   MCHC 37.0 (*)    All other components within normal limits  D-DIMER, QUANTITATIVE - Abnormal; Notable for the following components:   D-Dimer, Quant 0.57 (*)    All other components within normal limits  TROPONIN I (HIGH SENSITIVITY)  TROPONIN I (HIGH SENSITIVITY)   ____________________________________________  12 Lead EKG  Poor quality EKG due to tremors.  Sinus rhythm demonstrating a rate of 83 bpm.  Normal axis.  Normal intervals.  Couple PVCs.  No clear evidence of acute ischemia. No comparison. ____________________________________________  RADIOLOGY  ED MD interpretation: 2 view CXR reviewed by me without evidence of acute cardiopulmonary pathology.  Official radiology report(s): DG Chest 2 View  Result Date:  06/18/2021 CLINICAL DATA:  Chest pain. EXAM: CHEST - 2 VIEW COMPARISON:  None. FINDINGS: The heart size and mediastinal contours are within normal limits. Both lungs are clear. The visualized skeletal structures are unremarkable. IMPRESSION: No active cardiopulmonary disease. Electronically Signed   By: Lupita Raider M.D.   On: 06/18/2021 14:54   CT Angio Chest PE W and/or Wo Contrast  Result Date: 06/18/2021 CLINICAL DATA:  Rule out pulmonary embolus. Chest pain and positive D-dimer. EXAM: CT ANGIOGRAPHY CHEST WITH CONTRAST TECHNIQUE: Multidetector CT imaging of the chest was performed using the standard protocol during bolus administration of intravenous contrast. Multiplanar CT image reconstructions and MIPs were obtained to evaluate the vascular anatomy. CONTRAST:  24mL OMNIPAQUE IOHEXOL 350 MG/ML SOLN COMPARISON:  None. FINDINGS: Cardiovascular: Satisfactory opacification of the pulmonary arteries to the segmental level. No evidence of pulmonary embolism. Normal heart size. No pericardial effusion. Mediastinum/Nodes: No enlarged mediastinal, hilar, or axillary lymph nodes. Thyroid gland, trachea, and esophagus demonstrate no significant findings. Lungs/Pleura: Lungs are clear. No pleural effusion or pneumothorax. Upper Abdomen: No acute abnormality. Musculoskeletal: No chest wall abnormality. No acute or significant osseous findings. Review of the MIP images confirms the above findings. IMPRESSION: Negative exam. No  evidence for acute pulmonary embolus. Electronically Signed   By: Signa Kell M.D.   On: 06/18/2021 18:24    ____________________________________________   PROCEDURES and INTERVENTIONS  Procedure(s) performed (including Critical Care):  .1-3 Lead EKG Interpretation  Date/Time: 06/18/2021 4:40 PM Performed by: Delton Prairie, MD Authorized by: Delton Prairie, MD     Interpretation: normal     ECG rate:  61   ECG rate assessment: normal     Rhythm: sinus rhythm     Ectopy: none      Conduction: normal    Medications  potassium chloride SA (KLOR-CON) CR tablet 40 mEq (40 mEq Oral Given 06/18/21 1538)  acetaminophen (TYLENOL) tablet 1,000 mg (1,000 mg Oral Given 06/18/21 1635)  naproxen (NAPROSYN) tablet 500 mg (500 mg Oral Given 06/18/21 1635)  iohexol (OMNIPAQUE) 350 MG/ML injection 75 mL (75 mLs Intravenous Contrast Given 06/18/21 1745)    ____________________________________________   MDM / ED COURSE   45 year old male presents to the ED with chest pains, without evidence of acute pathology, and amenable to outpatient management.  Normal vitals.  Exam generally reassuring without evidence of distress, trauma, neurologic or vascular deficits.  No reproducible chest pain.  Blood work with mild hypokalemia, does repleted orally.  No evidence of ACS.  D-dimer slightly elevated in the setting of his pleuritic and atypical pains, and so CTA chest obtained and ruled out acute PE.  Resolution of symptoms after potassium repletion and Tylenol/NSAIDs.  We will discharge with return precautions.  Clinical Course as of 06/18/21 1915  Tue Jun 18, 2021  1636 Discussed plan of care with the patient, including D-dimer to screen for PE as well as second troponin.  He is in agreement with plan of care. [DS]  1841 Reassessed.  Patient worsening better.  We discussed benign work-up, outpatient management and return precautions for the ED. [DS]    Clinical Course User Index [DS] Delton Prairie, MD    ____________________________________________   FINAL CLINICAL IMPRESSION(S) / ED DIAGNOSES  Final diagnoses:  Other chest pain  Hypokalemia     ED Discharge Orders     None        Daryel Kenneth Katrinka Blazing   Note:  This document was prepared using Dragon voice recognition software and may include unintentional dictation errors.    Delton Prairie, MD 06/18/21 [phone]

## 2021-06-19 ENCOUNTER — Telehealth: Payer: Self-pay

## 2021-06-19 NOTE — Telephone Encounter (Signed)
Transition Care Management Follow-up Telephone Call Date of discharge and from where: 06/18/2021-[facility] How have you been since you were released from the hospital? Patient stated he is doing fine. Any questions or concerns? No  Items Reviewed: Did the pt receive and understand the discharge instructions provided? Yes  Medications obtained and verified? Yes  Other? No  Any new allergies since your discharge? No  Dietary orders reviewed? N/A Do you have support at home? Yes   Home Care and Equipment/Supplies: Were home health services ordered? not applicable If so, what is the name of the agency? N/A  Has the agency set up a time to come to the patient's home? not applicable Were any new equipment or medical supplies ordered?  No What is the name of the medical supply agency? N/A Were you able to get the supplies/equipment? not applicable Do you have any questions related to the use of the equipment or supplies? No  Functional Questionnaire: (I = Independent and D = Dependent) ADLs: I  Bathing/Dressing- I  Meal Prep- I  Eating- I  Maintaining continence- I  Transferring/Ambulation- I  Managing Meds- I  Follow up appointments reviewed:  PCP Hospital f/u appt confirmed? Yes Larae Grooms, NP ON 06/21/2021 @ 9:40 AM. Specialist Hospital f/u appt confirmed? No   Are transportation arrangements needed? No  If their condition worsens, is the pt aware to call PCP or go to the Emergency Dept.? Yes Was the patient provided with contact information for the PCP's office or ED? Yes Was to pt encouraged to call back with questions or concerns? Yes

## 2021-06-21 ENCOUNTER — Encounter: Payer: Self-pay | Admitting: Nurse Practitioner

## 2021-06-21 ENCOUNTER — Ambulatory Visit: Payer: Medicaid Other | Admitting: Nurse Practitioner

## 2021-06-21 ENCOUNTER — Other Ambulatory Visit: Payer: Self-pay

## 2021-06-21 ENCOUNTER — Ambulatory Visit (INDEPENDENT_AMBULATORY_CARE_PROVIDER_SITE_OTHER): Payer: Medicaid Other | Admitting: Nurse Practitioner

## 2021-06-21 VITALS — BP 110/72 | HR 41 | Temp 99.2°F | Ht 65.2 in | Wt 170.4 lb

## 2021-06-21 DIAGNOSIS — R110 Nausea: Secondary | ICD-10-CM | POA: Diagnosis not present

## 2021-06-21 DIAGNOSIS — E876 Hypokalemia: Secondary | ICD-10-CM

## 2021-06-21 MED ORDER — EPINEPHRINE 0.3 MG/0.3ML IJ SOAJ: 0.3000 mg | INTRAMUSCULAR | 1 refills | Status: DC | PRN

## 2021-06-21 NOTE — Progress Notes (Signed)
BP 110/72   Pulse (!) 41   Temp 99.2 F (37.3 C) (Oral)   Ht 5' 5.2" (1.656 m)   Wt 170 lb 6.4 oz (77.3 kg)   SpO2 97%   BMI 28.18 kg/m    Subjective:    Patient ID: Patrick Short, male    DOB: 13-Aug-1976, 45 y.o.   MRN: [id number]  HPI: Patrick Short is a 45 y.o. male  Chief Complaint  Patient presents with   Nausea   Patient states he had an episode where his potassium and sodium were low. Patient states he was having pain in his side and in his lung area.  It felt like someone was stabbing him in the chest.  When he was in the ER his potassium was 3.2 and sodium 134.  Patient states prior to going to the ER he has an episode of where he was sweating a lot, nausea, and felt terrible and unable to eat.  Patient isn't sure what caused the episode. Patient stopped smoking on Sunday and episode happened on Monday.    Relevant past medical, surgical, family and social history reviewed and updated as indicated. Interim medical history since our last visit reviewed. Allergies and medications reviewed and updated.  Review of Systems  Constitutional:  Positive for diaphoresis.  Gastrointestinal:  Positive for nausea.   Per HPI unless specifically indicated above     Objective:    BP 110/72   Pulse (!) 41   Temp 99.2 F (37.3 C) (Oral)   Ht 5' 5.2" (1.656 m)   Wt 170 lb 6.4 oz (77.3 kg)   SpO2 97%   BMI 28.18 kg/m   Wt Readings from Last 3 Encounters:  06/21/21 170 lb 6.4 oz (77.3 kg)  06/18/21 171 lb 15.3 oz (78 kg)  06/10/21 173 lb 6 oz (78.6 kg)    Physical Exam Vitals and nursing note reviewed.  Constitutional:      General: He is not in acute distress.    Appearance: Normal appearance. He is not ill-appearing, toxic-appearing or diaphoretic.  HENT:     Head: Normocephalic.     Right Ear: External ear normal.     Left Ear: External ear normal.     Nose: Nose normal. No congestion or rhinorrhea.     Mouth/Throat:     Mouth: Mucous membranes are moist.  Eyes:      General:        Right eye: No discharge.        Left eye: No discharge.     Extraocular Movements: Extraocular movements intact.     Conjunctiva/sclera: Conjunctivae normal.     Pupils: Pupils are equal, round, and reactive to light.  Cardiovascular:     Rate and Rhythm: Normal rate and regular rhythm.     Heart sounds: No murmur heard. Pulmonary:     Effort: Pulmonary effort is normal. No respiratory distress.     Breath sounds: Normal breath sounds. No wheezing, rhonchi or rales.  Abdominal:     General: Abdomen is flat. Bowel sounds are normal.  Musculoskeletal:     Cervical back: Normal range of motion and neck supple.  Skin:    General: Skin is warm and dry.     Capillary Refill: Capillary refill takes less than 2 seconds.  Neurological:     General: No focal deficit present.     Mental Status: He is alert and oriented to person, place, and time.  Psychiatric:  Mood and Affect: Mood normal.        Behavior: Behavior normal.        Thought Content: Thought content normal.        Judgment: Judgment normal.    Results for orders placed or performed during the hospital encounter of 95/74/73  Basic metabolic panel  Result Value Ref Range   Sodium 134 (L) 135 - 145 mmol/L   Potassium 3.2 (L) 3.5 - 5.1 mmol/L   Chloride 105 98 - 111 mmol/L   CO2 22 22 - 32 mmol/L   Glucose, Bld 106 (H) 70 - 99 mg/dL   BUN 17 6 - 20 mg/dL   Creatinine, Ser 1.11 0.61 - 1.24 mg/dL   Calcium 9.2 8.9 - 10.3 mg/dL   GFR, Estimated >60 >60 mL/min   Anion gap 7 5 - 15  CBC  Result Value Ref Range   WBC 8.6 4.0 - 10.5 K/uL   RBC 5.06 4.22 - 5.81 MIL/uL   Hemoglobin 16.9 13.0 - 17.0 g/dL   HCT 45.7 39.0 - 52.0 %   MCV 90.3 80.0 - 100.0 fL   MCH 33.4 26.0 - 34.0 pg   MCHC 37.0 (H) 30.0 - 36.0 g/dL   RDW 12.1 11.5 - 15.5 %   Platelets 267 150 - 400 K/uL   nRBC 0.0 0.0 - 0.2 %  D-dimer, quantitative  Result Value Ref Range   D-Dimer, Quant 0.57 (H) 0.00 - 0.50 ug/mL-FEU  Troponin I  (High Sensitivity)  Result Value Ref Range   Troponin I (High Sensitivity) 7 <18 ng/L  Troponin I (High Sensitivity)  Result Value Ref Range   Troponin I (High Sensitivity) 6 <18 ng/L      Assessment & Plan:   Problem List Items Addressed This Visit   None Visit Diagnoses     Low blood potassium    -  Primary   Will recheck lab work during visit today. Educated on low potassium symptoms. Will make further recommendations based on lab results.    Relevant Orders   Comp Met (CMET)   Nausea       Discussed with patient that we will recheck lab work today. Episode could have been because he stopped smoking. Will make recommendations based on labs.        Follow up plan: Return if symptoms worsen or fail to improve.

## 2021-06-22 LAB — COMPREHENSIVE METABOLIC PANEL
ALT: 25 IU/L (ref 0–44)
AST: 19 IU/L (ref 0–40)
Albumin/Globulin Ratio: 1.8 (ref 1.2–2.2)
Albumin: 4.5 g/dL (ref 4.0–5.0)
Alkaline Phosphatase: 70 IU/L (ref 44–121)
BUN/Creatinine Ratio: 10 (ref 9–20)
BUN: 9 mg/dL (ref 6–24)
Bilirubin Total: 0.2 mg/dL (ref 0.0–1.2)
CO2: 25 mmol/L (ref 20–29)
Calcium: 9.4 mg/dL (ref 8.7–10.2)
Chloride: 101 mmol/L (ref 96–106)
Creatinine, Ser: 0.93 mg/dL (ref 0.76–1.27)
Globulin, Total: 2.5 g/dL (ref 1.5–4.5)
Glucose: 83 mg/dL (ref 65–99)
Potassium: 3.8 mmol/L (ref 3.5–5.2)
Sodium: 140 mmol/L (ref 134–144)
Total Protein: 7 g/dL (ref 6.0–8.5)
eGFR: 103 mL/min/{1.73_m2} (ref >59)

## 2021-06-24 NOTE — Progress Notes (Signed)
Hi Patrick Short.  Your lab work looks good.  No concerns at this time.  Follow up as discussed.

## 2021-07-01 ENCOUNTER — Ambulatory Visit: Payer: Self-pay

## 2021-07-01 NOTE — Telephone Encounter (Signed)
Patient called and says he is feeling funny, dizziness when standing, BP 106/50's-107/60's, P 50's this weekend, today BP 110/70 P 55. He says he's been feeling like this since Saturday. He says the dizziness is moderate, he has to hold on while walking. He says he's been having a mild headache x 2 days, which he doesn't get a headache at all. No other symptoms. Appointment scheduled for tomorrow at 1100 with Rodman Pickle, FNP, care advice given, patient verbalized understanding.   Reason for Disposition  [1] MODERATE dizziness (e.g., interferes with normal activities) AND [2] has NOT been evaluated by physician for this  (Exception: dizziness caused by heat exposure, sudden standing, or poor fluid intake)  Answer Assessment - Initial Assessment Questions 1. DESCRIPTION: "Describe your dizziness."     When standing get dizzy 2. LIGHTHEADED: "Do you feel lightheaded?" (e.g., somewhat faint, woozy, weak upon standing)     Yes 3. VERTIGO: "Do you feel like either you or the room is spinning or tilting?" (i.e. vertigo)     No 4. SEVERITY: "How bad is it?"  "Do you feel like you are going to faint?" "Can you stand and walk?"   - MILD: Feels slightly dizzy, but walking normally.   - MODERATE: Feels unsteady when walking, but not falling; interferes with normal activities (e.g., school, work).   - SEVERE: Unable to walk without falling, or requires assistance to walk without falling; feels like passing out now.      Moderate 5. ONSET:  "When did the dizziness begin?"     Couple days ago 6. AGGRAVATING FACTORS: "Does anything make it worse?" (e.g., standing, change in head position)     Standing 7. HEART RATE: "Can you tell me your heart rate?" "How many beats in 15 seconds?"  (Note: not all patients can do this)       55 8. CAUSE: "What do you think is causing the dizziness?"     Not sure, BP 110/70 9. RECURRENT SYMPTOM: "Have you had dizziness before?" If Yes, ask: "When was the last time?"  "What happened that time?"     Yes, went to the hospital 10. OTHER SYMPTOMS: "Do you have any other symptoms?" (e.g., fever, chest pain, vomiting, diarrhea, bleeding)       Mild headache x 2 days 11. PREGNANCY: "Is there any chance you are pregnant?" "When was your last menstrual period?"       N/A  Protocols used: Dizziness - Lightheadedness-A-AH

## 2021-07-01 NOTE — Progress Notes (Signed)
Established Patient Office Visit  Subjective:  Patient ID: Patrick Short, male    DOB: 07-01-1976  Age: 45 y.o. MRN: [id number]  CC:  Chief Complaint  Patient presents with   Palpitations    For 1.5 weeks     HPI Patrick Short presents for palpitations, dizziness when standing, and diaphoresis for the last 1.5 weeks. He went to the ER where his potassium was slightly low, however on recheck 1 week ago, it was back to normal range. He has been checking his blood pressure at home and it has been 120s/80s. He didn't take his blood pressure medication yesterday or today. He endorses intermittent palpitations and has noticed his heart rate going from 50s-120s at home. He states that he had a history of a-fib in the past, however was told to stop taking his blood thinner back in 2018 when he was at the [place] in [place]. He denies chest pain, shortness of breath, and leg swelling.    Past Medical History:  Diagnosis Date   Anxiety    Depression    Mineral deficiency    PTSD (post-traumatic stress disorder)     Past Surgical History:  Procedure Laterality Date   APPENDECTOMY     EYE SURGERY     laceration to pupil   FRACTURE SURGERY Left    Rod- which was replaced due to rejection of original rod   SPINAL FUSION     SPLENECTOMY, TOTAL      Family History  Problem Relation Age of Onset   Hypertension Mother    Hypertension Father    Heart disease Maternal Grandmother    Hypertension Maternal Grandmother    Heart disease Paternal Grandfather    Hypertension Paternal Grandfather    Other Son        Trisomy 60    Social History   Socioeconomic History   Marital status: Single    Spouse name: Not on file   Number of children: Not on file   Years of education: Not on file   Highest education level: Not on file  Occupational History   Not on file  Tobacco Use   Smoking status: Every Day    Packs/day: 1.00    Years: 25.00    Pack years: 25.00    Types: Cigarettes    Smokeless tobacco: Never  Vaping Use   Vaping Use: Never used  Substance and Sexual Activity   Alcohol use: Not Currently   Drug use: Yes    Types: Marijuana   Sexual activity: Yes  Other Topics Concern   Not on file  Social History Narrative   Not on file   Social Determinants of Health   Financial Resource Strain: Not on file  Food Insecurity: Not on file  Transportation Needs: Not on file  Physical Activity: Not on file  Stress: Not on file  Social Connections: Not on file  Intimate Partner Violence: Not on file    Outpatient Medications Prior to Visit  Medication Sig Dispense Refill   buPROPion (WELLBUTRIN XL) 300 MG 24 hr tablet Take 1 tablet (300 mg total) by mouth daily. 90 tablet 1   EPINEPHrine (EPIPEN 2-PAK) 0.3 mg/0.3 mL IJ SOAJ injection Inject 0.3 mg into the muscle as needed for anaphylaxis. 1 each 1   rOPINIRole (REQUIP) 1 MG tablet Take 1 tablet (1 mg total) by mouth at bedtime. 90 tablet 1   sertraline (ZOLOFT) 25 MG tablet Take 1 tablet (25 mg total) by mouth  daily. 60 tablet 0   valsartan-hydrochlorothiazide (DIOVAN-HCT) 160-12.5 MG tablet Take 1 tablet by mouth daily. 90 tablet 1   No facility-administered medications prior to visit.    Allergies  Allergen Reactions   Aspirin Shortness Of Breath   Bee Venom Anaphylaxis   Sulfa Antibiotics Anaphylaxis   Penicillins Rash    ROS Review of Systems  Constitutional:  Positive for diaphoresis.  Respiratory: Negative.    Cardiovascular:  Positive for palpitations.  Genitourinary: Negative.   Musculoskeletal: Negative.   Skin: Negative.   Neurological:  Positive for dizziness (when standing up) and headaches (last Saturday).     Objective:    Physical Exam Vitals and nursing note reviewed.  Constitutional:      Appearance: Normal appearance.  HENT:     Head: Normocephalic.  Eyes:     Conjunctiva/sclera: Conjunctivae normal.  Cardiovascular:     Rate and Rhythm: Tachycardia present. Rhythm  irregular.     Pulses: Normal pulses.     Heart sounds: Normal heart sounds.  Pulmonary:     Effort: Pulmonary effort is normal.     Breath sounds: Normal breath sounds.  Abdominal:     Palpations: Abdomen is soft.     Tenderness: There is no abdominal tenderness.  Musculoskeletal:     Cervical back: Normal range of motion.     Right lower leg: No edema.     Left lower leg: No edema.  Skin:    General: Skin is warm and dry.  Neurological:     General: No focal deficit present.     Mental Status: He is alert and oriented to person, place, and time.  Psychiatric:        Mood and Affect: Mood normal.        Behavior: Behavior normal.        Thought Content: Thought content normal.        Judgment: Judgment normal.    BP (!) 144/95 (BP Location: Right Arm, Patient Position: Sitting)   Pulse (!) 120   Temp 98.3 F (36.8 C)   Resp 18   Wt 171 lb 9.6 oz (77.8 kg)   SpO2 97%   BMI 28.38 kg/m  Wt Readings from Last 3 Encounters:  07/02/21 171 lb 9.6 oz (77.8 kg)  06/21/21 170 lb 6.4 oz (77.3 kg)  06/18/21 171 lb 15.3 oz (78 kg)     Health Maintenance Due  Topic Date Due   COLONOSCOPY (Pts 45-44yrs Insurance coverage will need to be confirmed)  Never done   INFLUENZA VACCINE  Never done    There are no preventive care reminders to display for this patient.  Lab Results  Component Value Date   TSH 1.260 02/18/2021   Lab Results  Component Value Date   WBC 8.6 06/18/2021   HGB 16.9 06/18/2021   HCT 45.7 06/18/2021   MCV 90.3 06/18/2021   PLT 267 06/18/2021   Lab Results  Component Value Date   NA 140 06/21/2021   K 3.8 06/21/2021   CO2 25 06/21/2021   GLUCOSE 83 06/21/2021   BUN 9 06/21/2021   CREATININE 0.93 06/21/2021   BILITOT 0.2 06/21/2021   ALKPHOS 70 06/21/2021   AST 19 06/21/2021   ALT 25 06/21/2021   PROT 7.0 06/21/2021   ALBUMIN 4.5 06/21/2021   CALCIUM 9.4 06/21/2021   ANIONGAP 7 06/18/2021   EGFR 103 06/21/2021   Lab Results  Component  Value Date   CHOL 198 02/18/2021   Lab  Results  Component Value Date   HDL 35 (L) 02/18/2021   Lab Results  Component Value Date   LDLCALC 139 (H) 02/18/2021   Lab Results  Component Value Date   TRIG 133 02/18/2021   Lab Results  Component Value Date   CHOLHDL 5.7 (H) 02/18/2021   No results found for: HGBA1C    Assessment & Plan:   Problem List Items Addressed This Visit       Cardiovascular and Mediastinum   Hypertension    Blood pressure slightly elevated today. Encouraged him to take his diovan daily. Keep checking blood pressure at home.       Relevant Medications   apixaban (ELIQUIS) 5 MG TABS tablet   Atrial fibrillation (HCC)    History of a-fib about 4-5 years ago. He states he was told he didn't have to worry about it anymore and stopped his blood thinner. EKG in ER on 06/20/21 reviewed and shows normal sinus rhythm. Heart rate during office visit today ranged from 80s-130s while sitting. His heart rate last week in the office was 41. Will defer rate control medication to cardiology. CHADSVASC score is 1. Will start him on eliquis twice a day as no history of bleeding and in anticipation of cardiology visit. Urgent referral placed to cardiology.       Relevant Medications   apixaban (ELIQUIS) 5 MG TABS tablet   Other Relevant Orders   Basic Metabolic Panel (BMET)   CBC with Differential   TSH   Ambulatory referral to Cardiology   Other Visit Diagnoses     Palpitation    -  Primary   EKG shows a-fib. See plan for a-fib.   Relevant Orders   EKG 12-Lead (Completed)       Meds ordered this encounter  Medications   apixaban (ELIQUIS) 5 MG TABS tablet    Sig: Take 1 tablet (5 mg total) by mouth 2 (two) times daily.    Dispense:  180 tablet    Refill:  0    Follow-up: Return if symptoms worsen or fail to improve, for keep appointment on 9/29 with PCP.    Charyl Dancer, NP

## 2021-07-02 ENCOUNTER — Ambulatory Visit (INDEPENDENT_AMBULATORY_CARE_PROVIDER_SITE_OTHER): Payer: Medicaid Other | Admitting: Nurse Practitioner

## 2021-07-02 ENCOUNTER — Other Ambulatory Visit: Payer: Self-pay

## 2021-07-02 ENCOUNTER — Encounter: Payer: Self-pay | Admitting: Nurse Practitioner

## 2021-07-02 VITALS — BP 144/95 | HR 120 | Temp 98.3°F | Resp 18 | Wt 171.6 lb

## 2021-07-02 DIAGNOSIS — R002 Palpitations: Secondary | ICD-10-CM

## 2021-07-02 DIAGNOSIS — R42 Dizziness and giddiness: Secondary | ICD-10-CM

## 2021-07-02 DIAGNOSIS — I10 Essential (primary) hypertension: Secondary | ICD-10-CM

## 2021-07-02 DIAGNOSIS — I4891 Unspecified atrial fibrillation: Secondary | ICD-10-CM | POA: Diagnosis not present

## 2021-07-02 DIAGNOSIS — I480 Paroxysmal atrial fibrillation: Secondary | ICD-10-CM | POA: Insufficient documentation

## 2021-07-02 MED ORDER — APIXABAN 5 MG PO TABS: 5.0000 mg | ORAL_TABLET | Freq: Two times a day (BID) | ORAL | 0 refills | Status: DC

## 2021-07-02 NOTE — Progress Notes (Signed)
EKG interpreted by me on 07/02/21 showed atrial fibrillation with some flutter waves, heart rate 120

## 2021-07-02 NOTE — Assessment & Plan Note (Signed)
History of a-fib about 4-5 years ago. He states he was told he didn't have to worry about it anymore and stopped his blood thinner. EKG in ER on 06/20/21 reviewed and shows normal sinus rhythm. Heart rate during office visit today ranged from 80s-130s while sitting. His heart rate last week in the office was 41. Will defer rate control medication to cardiology. CHADSVASC score is 1. Will start him on eliquis twice a day as no history of bleeding and in anticipation of cardiology visit. Urgent referral placed to cardiology.

## 2021-07-02 NOTE — Assessment & Plan Note (Signed)
Blood pressure slightly elevated today. Encouraged him to take his diovan daily. Keep checking blood pressure at home.

## 2021-07-02 NOTE — Patient Instructions (Signed)
Start eliquis twice a day You should be hearing from the cardiologist soon to set up an appointment

## 2021-07-03 ENCOUNTER — Ambulatory Visit (INDEPENDENT_AMBULATORY_CARE_PROVIDER_SITE_OTHER): Payer: Medicaid Other

## 2021-07-03 ENCOUNTER — Ambulatory Visit (INDEPENDENT_AMBULATORY_CARE_PROVIDER_SITE_OTHER): Payer: Medicaid Other | Admitting: Internal Medicine

## 2021-07-03 ENCOUNTER — Encounter: Payer: Self-pay | Admitting: Internal Medicine

## 2021-07-03 VITALS — BP 120/90 | HR 96 | Ht 66.0 in | Wt 169.0 lb

## 2021-07-03 DIAGNOSIS — I480 Paroxysmal atrial fibrillation: Secondary | ICD-10-CM

## 2021-07-03 DIAGNOSIS — R002 Palpitations: Secondary | ICD-10-CM

## 2021-07-03 DIAGNOSIS — I10 Essential (primary) hypertension: Secondary | ICD-10-CM

## 2021-07-03 LAB — BASIC METABOLIC PANEL
BUN/Creatinine Ratio: 10 (ref 9–20)
BUN: 11 mg/dL (ref 6–24)
CO2: 22 mmol/L (ref 20–29)
Calcium: 9.3 mg/dL (ref 8.7–10.2)
Chloride: 103 mmol/L (ref 96–106)
Creatinine, Ser: 1.06 mg/dL (ref 0.76–1.27)
Glucose: 91 mg/dL (ref 65–99)
Potassium: 4.3 mmol/L (ref 3.5–5.2)
Sodium: 140 mmol/L (ref 134–144)
eGFR: 88 mL/min/{1.73_m2} (ref >59)

## 2021-07-03 LAB — CBC WITH DIFFERENTIAL/PLATELET
Basophils Absolute: 0.1 10*3/uL (ref 0.0–0.2)
Basos: 1 %
EOS (ABSOLUTE): 0.2 10*3/uL (ref 0.0–0.4)
Eos: 2 %
Hematocrit: 45 % (ref 37.5–51.0)
Hemoglobin: 15.4 g/dL (ref 13.0–17.7)
Immature Grans (Abs): 0 10*3/uL (ref 0.0–0.1)
Immature Granulocytes: 0 %
Lymphocytes Absolute: 1.4 10*3/uL (ref 0.7–3.1)
Lymphs: 21 %
MCH: 32.8 pg (ref 26.6–33.0)
MCHC: 34.2 g/dL (ref 31.5–35.7)
MCV: 96 fL (ref 79–97)
Monocytes Absolute: 0.6 10*3/uL (ref 0.1–0.9)
Monocytes: 9 %
Neutrophils Absolute: 4.4 10*3/uL (ref 1.4–7.0)
Neutrophils: 67 %
Platelets: 254 10*3/uL (ref 150–450)
RBC: 4.69 x10E6/uL (ref 4.14–5.80)
RDW: 12.4 % (ref 11.6–15.4)
WBC: 6.6 10*3/uL (ref 3.4–10.8)

## 2021-07-03 LAB — TSH: TSH: 0.593 u[IU]/mL (ref 0.450–4.500)

## 2021-07-03 MED ORDER — METOPROLOL TARTRATE 25 MG PO TABS: 25.0000 mg | ORAL_TABLET | Freq: Two times a day (BID) | ORAL | 5 refills | Status: DC | PRN

## 2021-07-03 NOTE — Progress Notes (Signed)
New Outpatient Visit Date: 07/03/2021  Referring Provider: Gerre Scull, NP [address] [place],  [place] [address]  Chief Complaint: Atrial fibrillation  HPI:  Patrick Short is a 45 y.o. male who is being seen today for the evaluation of atrial fibrillation at the request of Ms. McElwee. He has a history of atrial fibrillation (reportedly placed on anticoagulation in 2008 but no longer taking), hypertension, anxiety, depression, and PTSD.  He initially presented to the [facility] ED on 06/18/2021 with palpitations, lightheadedness, and diaphoresis. He was noted to be in sinus rhythm at that time.  However, when he saw Ms. McElwee yesterday, I his EKG showed atrial fibrillation.  He was placed on apixaban and referred to [place] for further management.  Today, Patrick Short reports that he has been experiencing intermittent palpitations and clamminess over the last month.  He went camping 2 weeks ago and felt quite fatigued and short of breath with associated diaphoresis while hiking.  He reports that 5-6 years ago he was diagnosed with atrial fibrillation through the [facility] in [place].  He was initially on anticoagulation though this was stopped by the [place] after he moved to [facility].  He also was on a heart rate control medication at 1 point though this was discontinued due to low heart rates.  He denies chest pain and lightheadedness.  He does not snore.  He has never been diagnosed with sleep apnea.  He does not have any bleeding nor has he ever had a stroke.  --------------------------------------------------------------------------------------------------  Cardiovascular History & Procedures: Cardiovascular Problems: Paroxysmal atrial fibrillation  Risk Factors: Hypertension, male gender, and tobacco use  Cath/PCI: None  CV Surgery: None  EP Procedures and Devices: None  Non-Invasive Evaluation(s): None  Recent CV Pertinent Labs: Lab Results  Component Value  Date   CHOL 198 02/18/2021   HDL 35 (L) 02/18/2021   LDLCALC 139 (H) 02/18/2021   TRIG 133 02/18/2021   CHOLHDL 5.7 (H) 02/18/2021   K 4.3 07/02/2021   BUN 11 07/02/2021   CREATININE 1.06 07/02/2021    --------------------------------------------------------------------------------------------------  Past Medical History:  Diagnosis Date   Anxiety    Depression    Hypertension    Mineral deficiency    PTSD (post-traumatic stress disorder)     Past Surgical History:  Procedure Laterality Date   APPENDECTOMY     EYE SURGERY     laceration to pupil   FRACTURE SURGERY Left    Rod- which was replaced due to rejection of original rod   SPINAL FUSION     SPLENECTOMY, TOTAL      Current Meds  Medication Sig   apixaban (ELIQUIS) 5 MG TABS tablet Take 1 tablet (5 mg total) by mouth 2 (two) times daily.   buPROPion (WELLBUTRIN XL) 300 MG 24 hr tablet Take 1 tablet (300 mg total) by mouth daily.   EPINEPHrine (EPIPEN 2-PAK) 0.3 mg/0.3 mL IJ SOAJ injection Inject 0.3 mg into the muscle as needed for anaphylaxis.   metoprolol tartrate (LOPRESSOR) 25 MG tablet Take 1 tablet (25 mg total) by mouth 2 (two) times daily as needed (palpitations).   rOPINIRole (REQUIP) 1 MG tablet Take 1 tablet (1 mg total) by mouth at bedtime.   sertraline (ZOLOFT) 25 MG tablet Take 1 tablet (25 mg total) by mouth daily.   valsartan-hydrochlorothiazide (DIOVAN-HCT) 160-12.5 MG tablet Take 1 tablet by mouth daily.    Allergies: Aspirin, Bee venom, Sulfa antibiotics, and Penicillins  Social History  Tobacco Use   Smoking status: Former    Packs/day: 1.00    Years: 25.00    Pack years: 25.00    Types: Cigarettes   Smokeless tobacco: Never  Vaping Use   Vaping Use: Never used  Substance Use Topics   Alcohol use: Not Currently   Drug use: Yes    Frequency: 7.0 times per week    Types: Marijuana    Family History  Problem Relation Age of Onset   Hypertension Mother    Atrial fibrillation  Mother    Hypertension Father    Heart disease Maternal Grandmother    Hypertension Maternal Grandmother    Hypertension Paternal Grandfather    Coronary artery disease Paternal Grandfather    Other Son        Trisomy 21    Review of Systems: A 12-system review of systems was performed and was negative except as noted in the HPI.  --------------------------------------------------------------------------------------------------  Physical Exam: BP 120/90 (BP Location: Left Arm, Patient Position: Sitting, Cuff Size: Normal)   Pulse 96   Ht 5\' 6"  (1.676 m)   Wt 169 lb (76.7 kg)   SpO2 98%   BMI 27.28 kg/m   General: NAD. HEENT: No conjunctival pallor or scleral icterus. Facemask in place. Neck: Supple without lymphadenopathy, thyromegaly, JVD, or HJR. No carotid bruit. Lungs: Normal work of breathing. Clear to auscultation bilaterally without wheezes or crackles. Heart: Irregularly irregular rhythm without murmurs, rubs, or gallops. Non-displaced PMI. Abd: Bowel sounds present. Soft, NT/ND without hepatosplenomegaly Ext: No lower extremity edema. Radial, PT, and DP pulses are 2+ bilaterally Skin: Warm and dry without rash. Neuro: CNIII-XII intact. Strength and fine-touch sensation intact in upper and lower extremities bilaterally. Psych: Normal mood and affect.  EKG (07/02/2021): Atrial fibrillation with rapid ventricular response and nonspecific ST changes.  Lab Results  Component Value Date   WBC 6.6 07/02/2021   HGB 15.4 07/02/2021   HCT 45.0 07/02/2021   MCV 96 07/02/2021   PLT 254 07/02/2021    Lab Results  Component Value Date   NA 140 07/02/2021   K 4.3 07/02/2021   CL 103 07/02/2021   CO2 22 07/02/2021   BUN 11 07/02/2021   CREATININE 1.06 07/02/2021   GLUCOSE 91 07/02/2021   ALT 25 06/21/2021    Lab Results  Component Value Date   CHOL 198 02/18/2021   HDL 35 (L) 02/18/2021   LDLCALC 139 (H) 02/18/2021   TRIG 133 02/18/2021   CHOLHDL 5.7 (H)  02/18/2021     --------------------------------------------------------------------------------------------------  ASSESSMENT AND PLAN: Paroxysmal atrial fibrillation: Ms. Lechuga reports a history of atrial fibrillation diagnosed 5-6 years ago and has been experiencing intermittent palpitations, diaphoresis, and shortness of breath over the last month.  I suspect his symptoms are due to atrial fibrillation.  He was noted to be in atrial fibrillation yesterday at his PCPs office, though EKG at ED visit earlier this month demonstrated sinus rhythm.  Exam today is again notable for an irregularly irregular rhythm albeit with normal ventricular response on exam (EKG not repeated today).  I am somewhat reluctant to place him on a standing AV nodal blocking agent given potential for spontaneous conversion to sinus rhythm and history of bradycardia in the past.  We will therefore initiate metoprolol tartrate 25 mg twice daily as needed for palpitations.  We will arrange for a 14-day event monitor to see if Patrick Short has persistent atrial fibrillation or if he goes in and out of atrial fibrillation.  If it is the latter, it may be worthwhile to have him see EP to discuss antiarrhythmic medication or ablation to help maintain sinus rhythm.  If he is persistently in atrial fibrillation, we will pursue cardioversion after he has completed 4 weeks of therapeutic anticoagulation.  We will also refer him for echocardiogram to exclude structural abnormality.  Labs yesterday were unremarkable.  Long-term, Patrick Short may not require anticoagulation given a CHA2DS2-VASc score of 1.  However, since we are contemplating cardioversion, I think it is reasonable to continue apixaban for now.  Hypertension: Blood pressure borderline today with diastolic reading of 90 mmHg.  Continue current medications with addition of as needed metoprolol, as above.  Follow-up: Return to clinic in 1 month.  Yvonne Kendall, MD 07/05/2021 4:45  PM

## 2021-07-03 NOTE — Patient Instructions (Signed)
Medication Instructions:   Your physician has recommended you make the following change in your medication:   START Metoprolol Tartrate 25 mg twice daily AS NEEDED for palpitations  *If you need a refill on your cardiac medications before your next appointment, please call your pharmacy*   Lab Work:  None ordered  Testing/Procedures:  1) Your physician has requested that you have an echocardiogram. Echocardiography is a painless test that uses sound waves to create images of your heart. It provides your doctor with information about the size and shape of your heart and how well your heart's chambers and valves are working. This procedure takes approximately one hour. There are no restrictions for this procedure.  2) Your physician has recommended that you wear a Zio XT monitor for TWO WEEKS.   This monitor is a medical device that records the heart's electrical activity. Doctors most often use these monitors to diagnose arrhythmias. Arrhythmias are problems with the speed or rhythm of the heartbeat. The monitor is a small device applied to your chest. You can wear one while you do your normal daily activities. While wearing this monitor if you have any symptoms to push the button and record what you felt. Once you have worn this monitor for the period of time provider prescribed (Usually 14 days), you will return the monitor device in the postage paid box. Once it is returned they will download the data collected and provide [place] with a report which the provider will then review and we will call you with those results. Important tips:  Avoid showering during the first 24 hours of wearing the monitor. Avoid excessive sweating to help maximize wear time. Do not submerge the device, no hot tubs, and no swimming pools. Keep any lotions or oils away from the patch. After 24 hours you may shower with the patch on. Take brief showers with your back facing the shower head.  Do not remove patch once  it has been placed because that will interrupt data and decrease adhesive wear time. Push the button when you have any symptoms and write down what you were feeling. Once you have completed wearing your monitor, remove and place into box which has postage paid and place in your outgoing mailbox.  If for some reason you have misplaced your box then call our office and we can provide another box and/or mail it off for you.      Follow-Up: At [facility], you and your health needs are our priority.  As part of our continuing mission to provide you with exceptional heart care, we have created designated Provider Care Teams.  These Care Teams include your primary Cardiologist (physician) and Advanced Practice Providers (APPs -  Physician Assistants and Nurse Practitioners) who all work together to provide you with the care you need, when you need it.  We recommend signing up for the patient portal called "MyChart".  Sign up information is provided on this After Visit Summary.  MyChart is used to connect with patients for Virtual Visits (Telemedicine).  Patients are able to view lab/test results, encounter notes, upcoming appointments, etc.  Non-urgent messages can be sent to your provider as well.   To learn more about what you can do with MyChart, go to [email or web address].    Your next appointment:   1 month(s)  The format for your next appointment:   In Person  Provider:   You may see Dr. Cristal Deer End or one of the following Advanced Practice Providers on  your designated Care Team:   Nicolasa Ducking, NP Eula Listen, PA-C Marisue Ivan, PA-C Cadence Costilla, [place]

## 2021-07-05 ENCOUNTER — Encounter: Payer: Self-pay | Admitting: Internal Medicine

## 2021-07-11 ENCOUNTER — Ambulatory Visit (INDEPENDENT_AMBULATORY_CARE_PROVIDER_SITE_OTHER): Payer: Medicaid Other | Admitting: Nurse Practitioner

## 2021-07-11 ENCOUNTER — Other Ambulatory Visit: Payer: Self-pay

## 2021-07-11 ENCOUNTER — Encounter: Payer: Self-pay | Admitting: Nurse Practitioner

## 2021-07-11 VITALS — BP 143/77 | HR 85 | Temp 98.1°F | Ht 66.0 in | Wt 170.2 lb

## 2021-07-11 DIAGNOSIS — F419 Anxiety disorder, unspecified: Secondary | ICD-10-CM | POA: Diagnosis not present

## 2021-07-11 DIAGNOSIS — I4891 Unspecified atrial fibrillation: Secondary | ICD-10-CM | POA: Diagnosis not present

## 2021-07-11 MED ORDER — SERTRALINE HCL 25 MG PO TABS: 25.0000 mg | ORAL_TABLET | Freq: Every day | ORAL | 1 refills | Status: DC

## 2021-07-11 NOTE — Assessment & Plan Note (Signed)
Chronic. Improved.  Zoloft is helping with his anxiety.  Denies concerns about medication or anxiety at visit today.  Refills sent.  Follow up in 2 months.

## 2021-07-11 NOTE — Assessment & Plan Note (Signed)
Following up with Cardiology.  Patient states he has an ECHO scheduled and has sent back to Holter monitor.  Will follow up in October.

## 2021-07-11 NOTE — Progress Notes (Signed)
BP (!) 143/77   Pulse 85   Temp 98.1 F (36.7 C) (Oral)   Ht 5' 6" (1.676 m)   Wt 170 lb 3.2 oz (77.2 kg)   SpO2 96%   BMI 27.47 kg/m    Subjective:    Patient ID: Patrick Short, male    DOB: 02/03/1976, 45 y.o.   MRN: [id number]  HPI: Patrick Short is a 45 y.o. male  Chief Complaint  Patient presents with   Anxiety   AFIB Patient states he saw Cardiology and they gave him a Holter and it came off after about a week and he mailed it back.  He goes back on October 21st and they will do an Echo. Patient states the symptoms come back go.  He was given Metoprolol to use PRN.  Patient states his HR goes high at times then too low.  Patient feels like the Zoloft is definitely helping for his anxiety.    Denies HA, CP, SOB, dizziness, palpitations, visual changes, and lower extremity swelling.  Relevant past medical, surgical, family and social history reviewed and updated as indicated. Interim medical history since our last visit reviewed. Allergies and medications reviewed and updated.  Review of Systems  Eyes:  Negative for visual disturbance.  Respiratory:  Negative for shortness of breath.   Cardiovascular:  Negative for chest pain and leg swelling.  Neurological:  Negative for light-headedness and headaches.  Psychiatric/Behavioral:  The patient is nervous/anxious.    Per HPI unless specifically indicated above     Objective:    BP (!) 143/77   Pulse 85   Temp 98.1 F (36.7 C) (Oral)   Ht 5' 6" (1.676 m)   Wt 170 lb 3.2 oz (77.2 kg)   SpO2 96%   BMI 27.47 kg/m   Wt Readings from Last 3 Encounters:  07/11/21 170 lb 3.2 oz (77.2 kg)  07/03/21 169 lb (76.7 kg)  07/02/21 171 lb 9.6 oz (77.8 kg)    Physical Exam Vitals and nursing note reviewed.  Constitutional:      General: He is not in acute distress.    Appearance: Normal appearance. He is not ill-appearing, toxic-appearing or diaphoretic.  HENT:     Head: Normocephalic.     Right Ear: External ear normal.      Left Ear: External ear normal.     Nose: Nose normal. No congestion or rhinorrhea.     Mouth/Throat:     Mouth: Mucous membranes are moist.  Eyes:     General:        Right eye: No discharge.        Left eye: No discharge.     Extraocular Movements: Extraocular movements intact.     Conjunctiva/sclera: Conjunctivae normal.     Pupils: Pupils are equal, round, and reactive to light.  Cardiovascular:     Rate and Rhythm: Normal rate and regular rhythm.     Heart sounds: No murmur heard. Pulmonary:     Effort: Pulmonary effort is normal. No respiratory distress.     Breath sounds: Normal breath sounds. No wheezing, rhonchi or rales.  Abdominal:     General: Abdomen is flat. Bowel sounds are normal.  Musculoskeletal:     Cervical back: Normal range of motion and neck supple.  Skin:    General: Skin is warm and dry.     Capillary Refill: Capillary refill takes less than 2 seconds.  Neurological:     General: No focal deficit present.  Mental Status: He is alert and oriented to person, place, and time.  Psychiatric:        Mood and Affect: Mood normal.        Behavior: Behavior normal.        Thought Content: Thought content normal.        Judgment: Judgment normal.    Results for orders placed or performed in visit on 75/91/63  Basic Metabolic Panel (BMET)  Result Value Ref Range   Glucose 91 65 - 99 mg/dL   BUN 11 6 - 24 mg/dL   Creatinine, Ser 1.06 0.76 - 1.27 mg/dL   eGFR 88 >59 mL/min/1.73   BUN/Creatinine Ratio 10 9 - 20   Sodium 140 134 - 144 mmol/L   Potassium 4.3 3.5 - 5.2 mmol/L   Chloride 103 96 - 106 mmol/L   CO2 22 20 - 29 mmol/L   Calcium 9.3 8.7 - 10.2 mg/dL  CBC with Differential  Result Value Ref Range   WBC 6.6 3.4 - 10.8 x10E3/uL   RBC 4.69 4.14 - 5.80 x10E6/uL   Hemoglobin 15.4 13.0 - 17.7 g/dL   Hematocrit 45.0 37.5 - 51.0 %   MCV 96 79 - 97 fL   MCH 32.8 26.6 - 33.0 pg   MCHC 34.2 31.5 - 35.7 g/dL   RDW 12.4 11.6 - 15.4 %   Platelets  254 150 - 450 x10E3/uL   Neutrophils 67 Not Estab. %   Lymphs 21 Not Estab. %   Monocytes 9 Not Estab. %   Eos 2 Not Estab. %   Basos 1 Not Estab. %   Neutrophils Absolute 4.4 1.4 - 7.0 x10E3/uL   Lymphocytes Absolute 1.4 0.7 - 3.1 x10E3/uL   Monocytes Absolute 0.6 0.1 - 0.9 x10E3/uL   EOS (ABSOLUTE) 0.2 0.0 - 0.4 x10E3/uL   Basophils Absolute 0.1 0.0 - 0.2 x10E3/uL   Immature Granulocytes 0 Not Estab. %   Immature Grans (Abs) 0.0 0.0 - 0.1 x10E3/uL  TSH  Result Value Ref Range   TSH 0.593 0.450 - 4.500 uIU/mL      Assessment & Plan:   Problem List Items Addressed This Visit       Cardiovascular and Mediastinum   Atrial fibrillation ([place]) - Primary    Following up with Cardiology.  Patient states he has an ECHO scheduled and has sent back to Holter monitor.  Will follow up in October.         Other   Anxiety    Chronic. Improved.  Zoloft is helping with his anxiety.  Denies concerns about medication or anxiety at visit today.  Refills sent.  Follow up in 2 months.       Relevant Medications   sertraline (ZOLOFT) 25 MG tablet     Follow up plan: Return in about 2 months (around 09/10/2021) for Depression/Anxiety FU, Cardiology follow up.

## 2021-07-15 ENCOUNTER — Telehealth: Payer: Self-pay | Admitting: Medical

## 2021-07-15 NOTE — Telephone Encounter (Signed)
-----   Message from Lennon Alstrom, PA-C sent at 07/15/2021 10:22 AM EDT ----- Regarding: Luci [employer] Results I received a triage call from [facility] monitoring regarding this patient.  Per Zio (verbal account, over the phone --> report not uploaded yet), this patient has  (1) afib/aflutter burden of 26%  (2) sustained afib/flutter episodes at times beyond that of 1 minute (3) 37 vtach episodes (4) 2000 episodes of SVT  On review of EMR, patient is already on anticoagulation and metoprolol tartrate 25mg  BID. Echo has been ordered with possible referral to EP versus DCCV to be determined.  Please call the patient and try to get him in earlier than scheduled regarding his monitor.   I have Cc'd Dr. on this note for additional recommendations, if needed.  JV

## 2021-07-15 NOTE — Telephone Encounter (Signed)
Attempted to schedule.  LMOV to call office.  ° °

## 2021-07-15 NOTE — Telephone Encounter (Signed)
To Dr. Cheri Guppy. Scheduling has attempted to contact the patient.

## 2021-07-15 NOTE — Telephone Encounter (Signed)
Scheduled

## 2021-07-16 ENCOUNTER — Telehealth: Payer: Self-pay | Admitting: *Deleted

## 2021-07-16 DIAGNOSIS — I4892 Unspecified atrial flutter: Secondary | ICD-10-CM

## 2021-07-16 DIAGNOSIS — I471 Supraventricular tachycardia: Secondary | ICD-10-CM

## 2021-07-16 DIAGNOSIS — I4891 Unspecified atrial fibrillation: Secondary | ICD-10-CM

## 2021-07-16 MED ORDER — METOPROLOL TARTRATE 25 MG PO TABS: 25.0000 mg | ORAL_TABLET | Freq: Two times a day (BID) | ORAL | 5 refills | Status: DC

## 2021-07-16 NOTE — Telephone Encounter (Signed)
Spoke with pt. Notified of event monitor results and Dr. Serita Kyle recc.  Pt voiced understanding.  He will change taking Metoprolol Tartrate 25 mg twice daily on standing basis.  Pt states that he had only taken twice as needed previously.  Pt will follow up as scheduled this week 10/7 with Cadence Furth to reassess symptoms.  EP referral placed to discuss antiarrhythmic therapy and/or ablation.  Pt aware that our office will reach out to schedule EP consultation.  Pt has no further questions at this time.

## 2021-07-16 NOTE — Telephone Encounter (Signed)
-----   Message from Yvonne Kendall, MD sent at 07/16/2021 12:44 PM EDT ----- I have reviewed Patrick Short's event monitor, demonstrating paroxysmal atrial fibrillation/flutter as well as multiple episodes of PSVT.  At our last visit, we had added metoprolol tartrate 25 mg twice daily as needed for palpitations.  If he has not been taking this frequently, I suggest he begin taking it on a standing basis (metoprolol tartrate 25 mg twice daily).  He can follow-up as scheduled this week with Cadence Furth to reassess his symptoms.  I recommend EP consultation at his earliest convenience to discuss antiarrhythmic therapy and/or ablation.  Given that his atrial fibrillation is paroxysmal and relatively brief, there is no role for cardioversion at this time.

## 2021-07-18 NOTE — Progress Notes (Signed)
Cardiology Office Note:    Date:  07/19/2021   ID:  Patrick Short, DOB [birth year], MRN [id number]  PCP:  Patrick Grooms, NP  [facility] Cardiologist:  Patrick Kendall, MD  [facility] Electrophysiologist:  None   Referring MD: Patrick Grooms, NP   Chief Complaint: f/u afib/flutter  History of Present Illness:    Patrick Short is a 45 y.o. male with a hx of A. fib previously on anticoagulation, hypertension, anxiety, depression, PTSD.  Patient presented to [facility] on 06/18/2021 with palpitations.  He was noted to be in sinus rhythm.  He saw PCP who noted possible A. fib and he was referred to cardiology.  He was placed on Eliquis.  Was seen 07/03/2021 and reported intermittent palpitations. Exam showed irregularly irregular rhythm.  Added on metoprolol and a heart monitor was placed. And was for cardioversion after 4 weeks of anticoagulation. Echo was ordered  Today, EKG shows sinus bradycardia. He has felt better since he has been taking the metoprolol. Echo will be performed later this month. No chest pain, sob, Lle, orthopnea, pnd, palpitations.EH has been taking Eliquis daily. He plans on seeing EP at some point.   Past Medical History:  Diagnosis Date   Anxiety    Depression    Hypertension    Mineral deficiency    PTSD (post-traumatic stress disorder)     Past Surgical History:  Procedure Laterality Date   APPENDECTOMY     EYE SURGERY     laceration to pupil   FRACTURE SURGERY Left    Rod- which was replaced due to rejection of original rod   SPINAL FUSION     SPLENECTOMY, TOTAL      Current Medications: Current Meds  Medication Sig   apixaban (ELIQUIS) 5 MG TABS tablet Take 1 tablet (5 mg total) by mouth 2 (two) times daily.   buPROPion (WELLBUTRIN XL) 300 MG 24 hr tablet Take 1 tablet (300 mg total) by mouth daily.   EPINEPHrine (EPIPEN 2-PAK) 0.3 mg/0.3 mL IJ SOAJ injection Inject 0.3 mg into the muscle as needed for anaphylaxis.   metoprolol  tartrate (LOPRESSOR) 25 MG tablet Take 1 tablet (25 mg total) by mouth 2 (two) times daily.   rOPINIRole (REQUIP) 1 MG tablet Take 1 tablet (1 mg total) by mouth at bedtime.   sertraline (ZOLOFT) 25 MG tablet Take 1 tablet (25 mg total) by mouth daily.   valsartan-hydrochlorothiazide (DIOVAN-HCT) 160-12.5 MG tablet Take 1 tablet by mouth daily.     Allergies:   Aspirin, Bee venom, Sulfa antibiotics, and Penicillins   Social History   Socioeconomic History   Marital status: Single    Spouse name: Not on file   Number of children: Not on file   Years of education: Not on file   Highest education level: Not on file  Occupational History   Not on file  Tobacco Use   Smoking status: Former    Packs/day: 1.00    Years: 25.00    Pack years: 25.00    Types: Cigarettes   Smokeless tobacco: Never  Vaping Use   Vaping Use: Never used  Substance and Sexual Activity   Alcohol use: Not Currently   Drug use: Yes    Frequency: 7.0 times per week    Types: Marijuana   Sexual activity: Yes  Other Topics Concern   Not on file  Social History Narrative   Not on file   Social Determinants of Health   Financial Resource Strain:  Not on file  Food Insecurity: Not on file  Transportation Needs: Not on file  Physical Activity: Not on file  Stress: Not on file  Social Connections: Not on file     Family History: The patient's family history includes Atrial fibrillation in his mother; Coronary artery disease in his paternal grandfather; Heart disease in his maternal grandmother; Hypertension in his father, maternal grandmother, mother, and paternal grandfather; Other in his son.  ROS:   Please see the history of present illness.     All other systems reviewed and are negative.  EKGs/Labs/Other Studies Reviewed:    The following studies were reviewed today:  Echo has not been performed  Heart monitor 08/01/2021 The patient was monitored for 6 days, 12 hours The predominant rhythm  was sinus with an average rate of 74 bpm (range 40-125 bpm and sinus). There were occasional PACs and rare PVCs. 3026 episodes of supraventricular tachycardia occurred, lasting up to 27.1 seconds with a maximum rate of 255 bpm. Paroxysmal atrial fibrillation/flutter was evident (26% burden). Longest duration was 1 hour, 17 minutes. Average ventricular rate in atrial fibrillation was 129 bpm (range 69-224 bpm). No prolonged pause was identified. Patient triggered events correspond to sinus rhythm, PACs, PVCs, PSVT, and atrial fibrillation/flutter.   Predominantly sinus rhythm with occasional PACs and rare PVCs as well as numerous episodes of PSVT.  Paroxysmal atrial fibrillation/flutter also present with elevated average heart rates (atrial fibrillation burden 26%, average ventricular rate 129 bpm).  EKG:  EKG is  ordered today.  The ekg ordered today demonstrates SB. 56bpm, nonspecific T wave changes  Recent Labs: 06/21/2021: ALT 25 07/02/2021: BUN 11; Creatinine, Ser 1.06; Hemoglobin 15.4; Platelets 254; Potassium 4.3; Sodium 140; TSH 0.593  Recent Lipid Panel    Component Value Date/Time   CHOL 198 02/18/2021 1033   TRIG 133 02/18/2021 1033   HDL 35 (L) 02/18/2021 1033   CHOLHDL 5.7 (H) 02/18/2021 1033   LDLCALC 139 (H) 02/18/2021 1033     Physical Exam:    VS:  BP 104/60 (BP Location: Left Arm, Patient Position: Sitting, Cuff Size: Normal)   Pulse (!) 56   Ht 5\' 6"  (1.676 m)   Wt 172 lb 8 oz (78.2 kg)   SpO2 98%   BMI 27.84 kg/m     Wt Readings from Last 3 Encounters:  07/19/21 172 lb 8 oz (78.2 kg)  07/11/21 170 lb 3.2 oz (77.2 kg)  07/03/21 169 lb (76.7 kg)     GEN:  Well nourished, well developed in no acute distress HEENT: Normal NECK: No JVD; No carotid bruits LYMPHATICS: No lymphadenopathy CARDIAC: bradcyardia, RR, no murmurs, rubs, gallops RESPIRATORY:  Clear to auscultation without rales, wheezing or rhonchi  ABDOMEN: Soft, non-tender,  non-distended MUSCULOSKELETAL:  No edema; No deformity  SKIN: Warm and dry NEUROLOGIC:  Alert and oriented x 3 PSYCHIATRIC:  Normal affect   ASSESSMENT:    1. PAF (paroxysmal atrial fibrillation) (HCC)   2. Essential hypertension    PLAN:    In order of problems listed above:  Paroxysmal A. Fib EKG shows sinus bradycardia with heart rate 56bpm. He has been feeling better since starting metoprolol. CHADSVASC of 1 . He is on Eliquis 5mg  BID. Will discuss continuation of anticoagulation with MD given CHADSVASC score of 1. Will make apt with EP.   Hypertension BP good. Continue metoprolol and Diovan-HCT  Disposition: Follow up  in 3 months  with Md/APP    Signed, Emiah Pellicano 07/05/21, PA-C  07/19/2021 2:07 PM    [facility]

## 2021-07-19 ENCOUNTER — Other Ambulatory Visit: Payer: Self-pay

## 2021-07-19 ENCOUNTER — Ambulatory Visit (INDEPENDENT_AMBULATORY_CARE_PROVIDER_SITE_OTHER): Payer: Medicaid Other | Admitting: Medical

## 2021-07-19 ENCOUNTER — Encounter: Payer: Self-pay | Admitting: Medical

## 2021-07-19 VITALS — BP 104/60 | HR 56 | Ht 66.0 in | Wt 172.5 lb

## 2021-07-19 DIAGNOSIS — I480 Paroxysmal atrial fibrillation: Secondary | ICD-10-CM

## 2021-07-19 DIAGNOSIS — I10 Essential (primary) hypertension: Secondary | ICD-10-CM

## 2021-07-19 NOTE — Patient Instructions (Signed)
Medication Instructions:  Your physician recommends that you continue on your current medications as directed. Please refer to the Current Medication list given to you today.  *If you need a refill on your cardiac medications before your next appointment, please call your pharmacy*   Lab Work: None ordered  If you have labs (blood work) drawn today and your tests are completely normal, you will receive your results only by: MyChart Message (if you have MyChart) OR A paper copy in the mail If you have any lab test that is abnormal or we need to change your treatment, we will call you to review the results.   Testing/Procedures: None ordered   Follow-Up: At [facility], you and your health needs are our priority.  As part of our continuing mission to provide you with exceptional heart care, we have created designated Provider Care Teams.  These Care Teams include your primary Cardiologist (physician) and Advanced Practice Providers (APPs -  Physician Assistants and Nurse Practitioners) who all work together to provide you with the care you need, when you need it.  We recommend signing up for the patient portal called "MyChart".  Sign up information is provided on this After Visit Summary.  MyChart is used to connect with patients for Virtual Visits (Telemedicine).  Patients are able to view lab/test results, encounter notes, upcoming appointments, etc.  Non-urgent messages can be sent to your provider as well.   To learn more about what you can do with MyChart, go to [email or web address].    Your next appointment:   3 month(s)  1st available with EP  The format for your next appointment:   In Person  Provider:   You may see Yvonne Kendall, MD or one of the following Advanced Practice Providers on your designated Care Team:   Nicolasa Ducking, NP Eula Listen, PA-C Marisue Ivan, PA-C Cadence Fransico Michael, [place]   Other Instructions

## 2021-07-22 ENCOUNTER — Telehealth: Payer: Self-pay | Admitting: *Deleted

## 2021-07-22 NOTE — Telephone Encounter (Signed)
Spoke with pt.  Notified that Cadence discussed anticoagulation with Dr. Okey Dupre and recc below. Pt voiced understanding. Pt will continue Eliquis for now and will follow up with Dr. Lalla Brothers as scheduled 07/31/21.  Pt has no further questions.

## 2021-07-22 NOTE — Telephone Encounter (Signed)
-----   Message from Cadence David Stall, PA-C sent at 07/22/2021  1:16 PM EDT ----- Please let patient know we discussed anticoagulation with MD. Answer below  ----- Message ----- From: Yvonne Kendall, MD Sent: 07/20/2021  10:23 AM EDT To: Cadence David Stall, PA-C  I'm glad he is feeling better.  Lets continue the Eliquis for now until he sees Dr. Lalla Brothers for further the evaluation.  I think he would be a good candidate for a-fib ablation, in which case he will probably need to be on anticoagulation for at least a month before and a few months after the procedure.  Thayer Ohm  ----- Message ----- From: Marianne Sofia, PA-C Sent: 07/19/2021   2:18 PM EDT To: Yvonne Kendall, MD  F/u afib. EKG with SB. Symptomatically better. On Eliquis for CHADSVASC of 1. Do we need to continue this or should we stop it? Please advise, thx.

## 2021-07-30 ENCOUNTER — Encounter: Payer: Self-pay | Admitting: Psychology

## 2021-07-30 ENCOUNTER — Other Ambulatory Visit: Payer: Self-pay

## 2021-07-30 ENCOUNTER — Encounter: Payer: Medicaid Other | Attending: Psychology | Admitting: Psychology

## 2021-07-30 DIAGNOSIS — F418 Other specified anxiety disorders: Secondary | ICD-10-CM | POA: Insufficient documentation

## 2021-07-30 DIAGNOSIS — G90523 Complex regional pain syndrome I of lower limb, bilateral: Secondary | ICD-10-CM | POA: Insufficient documentation

## 2021-07-30 DIAGNOSIS — G2581 Restless legs syndrome: Secondary | ICD-10-CM | POA: Insufficient documentation

## 2021-07-30 DIAGNOSIS — F4312 Post-traumatic stress disorder, chronic: Secondary | ICD-10-CM | POA: Insufficient documentation
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
## 2021-07-30 NOTE — Progress Notes (Signed)
Electrophysiology Office Note:    Date:  07/31/2021   ID:  Patrick Short, DOB Jul 13, 1976, MRN [id number]  PCP:  Larae Grooms, NP  [facility] Cardiologist:  Yvonne Kendall, MD  [facility] Electrophysiologist:  Lanier Prude, MD   Referring MD: Yvonne Kendall, MD   Chief Complaint: Atrial fibrillation  History of Present Illness:    Patrick Short is a 45 y.o. male who presents for an evaluation of atrial fibrillation at the request of Dr. Okey Dupre. Their medical history includes hypertension.  The patient last saw Cadence Furth on May 19, 2021.  He presented to the emergency department June 18, 2021 with palpitations.  In the ER he was noted to be in sinus rhythm.  He was subsequently seen on July 03, 2021 and was found to be in atrial fibrillation.  Anticoagulation was started and an echo was ordered.  ZIO monitor showed a 26% burden of atrial fibrillation and flutter.  He did have RVR when he was in atrial fibrillation.  He tells me he is quite symptomatic when he is in atrial fibrillation with palpitations and lightheadedness/dizziness.     Past Medical History:  Diagnosis Date   Anxiety    Depression    Hypertension    Mineral deficiency    PTSD (post-traumatic stress disorder)     Past Surgical History:  Procedure Laterality Date   APPENDECTOMY     EYE SURGERY     laceration to pupil   FRACTURE SURGERY Left    Rod- which was replaced due to rejection of original rod   SPINAL FUSION     SPLENECTOMY, TOTAL      Current Medications: Current Meds  Medication Sig   apixaban (ELIQUIS) 5 MG TABS tablet Take 1 tablet (5 mg total) by mouth 2 (two) times daily.   buPROPion (WELLBUTRIN XL) 300 MG 24 hr tablet Take 1 tablet (300 mg total) by mouth daily.   EPINEPHrine (EPIPEN 2-PAK) 0.3 mg/0.3 mL IJ SOAJ injection Inject 0.3 mg into the muscle as needed for anaphylaxis.   metoprolol tartrate (LOPRESSOR) 25 MG tablet Take 1 tablet (25 mg total) by mouth 2  (two) times daily.   rOPINIRole (REQUIP) 1 MG tablet Take 1 tablet (1 mg total) by mouth at bedtime.   sertraline (ZOLOFT) 25 MG tablet Take 1 tablet (25 mg total) by mouth daily.   valsartan-hydrochlorothiazide (DIOVAN-HCT) 160-12.5 MG tablet Take 1 tablet by mouth daily.     Allergies:   Aspirin, Bee venom, Sulfa antibiotics, and Penicillins   Social History   Socioeconomic History   Marital status: Single    Spouse name: Not on file   Number of children: Not on file   Years of education: Not on file   Highest education level: Not on file  Occupational History   Not on file  Tobacco Use   Smoking status: Former    Packs/day: 1.00    Years: 25.00    Pack years: 25.00    Types: Cigarettes   Smokeless tobacco: Never  Vaping Use   Vaping Use: Never used  Substance and Sexual Activity   Alcohol use: Not Currently   Drug use: Yes    Frequency: 7.0 times per week    Types: Marijuana   Sexual activity: Yes  Other Topics Concern   Not on file  Social History Narrative   Not on file   Social Determinants of Health   Financial Resource Strain: Not on file  Food Insecurity: Not  on file  Transportation Needs: Not on file  Physical Activity: Not on file  Stress: Not on file  Social Connections: Not on file     Family History: The patient's family history includes Atrial fibrillation in his mother; Coronary artery disease in his paternal grandfather; Heart disease in his maternal grandmother; Hypertension in his father, maternal grandmother, mother, and paternal grandfather; Other in his son.  ROS:   Please see the history of present illness.    All other systems reviewed and are negative.  EKGs/Labs/Other Studies Reviewed:    The following studies were reviewed today:  July 15, 2021 ZIO monitor personally reviewed 26% burden of atrial fibrillation Patient triggered episodes corresponded to atrial fibrillation, PACs, PVCs  July 19, 2021 EKG shows no  preexcitation, sinus rhythm  July 02, 2021 EKG shows coarse atrial fibrillation/flutter   EKG:  The ekg ordered today demonstrates sinus rhythm with PACs.  One of the PACs has a right bundle aberrancy   Recent Labs: 06/21/2021: ALT 25 07/02/2021: BUN 11; Creatinine, Ser 1.06; Hemoglobin 15.4; Platelets 254; Potassium 4.3; Sodium 140; TSH 0.593  Recent Lipid Panel    Component Value Date/Time   CHOL 198 02/18/2021 1033   TRIG 133 02/18/2021 1033   HDL 35 (L) 02/18/2021 1033   CHOLHDL 5.7 (H) 02/18/2021 1033   LDLCALC 139 (H) 02/18/2021 1033    Physical Exam:    VS:  BP 94/68 (BP Location: Left Arm, Patient Position: Sitting, Cuff Size: Normal)   Pulse (!) 59   Ht 5\' 6"  (1.676 m)   Wt 169 lb (76.7 kg)   SpO2 98%   BMI 27.28 kg/m     Wt Readings from Last 3 Encounters:  07/31/21 169 lb (76.7 kg)  07/19/21 172 lb 8 oz (78.2 kg)  07/11/21 170 lb 3.2 oz (77.2 kg)     GEN:  Well nourished, well developed in no acute distress HEENT: Normal NECK: No JVD; No carotid bruits LYMPHATICS: No lymphadenopathy CARDIAC: RRR, no murmurs, rubs, gallops RESPIRATORY:  Clear to auscultation without rales, wheezing or rhonchi  ABDOMEN: Soft, non-tender, non-distended MUSCULOSKELETAL:  No edema; No deformity  SKIN: Warm and dry NEUROLOGIC:  Alert and oriented x 3 PSYCHIATRIC:  Normal affect       ASSESSMENT:    1. PAF (paroxysmal atrial fibrillation) (HCC)   2. Atrial flutter, unspecified type (HCC)   3. Essential hypertension    PLAN:    In order of problems listed above:  #Paroxysmal atrial fibrillation and flutter Symptomatic.  Rhythm control is indicated.  I discussed management options including antiarrhythmic drug therapy and catheter ablation.  We discussed the efficacy between the 2 options and risks.  He would like to pursue catheter ablation which I think is very reasonable.  I discussed a catheter ablation procedure in detail including the risk, recovery and  expected efficacy.  He would like to proceed with scheduling.  He will need a CT scan prior to the ablation procedure.  He will continue taking his Eliquis prior to the ablation.  Risk, benefits, and alternatives to EP study and radiofrequency ablation for afib were also discussed in detail today. These risks include but are not limited to stroke, bleeding, vascular damage, tamponade, perforation, damage to the esophagus, lungs, and other structures, pulmonary vein stenosis, worsening renal function, and death. The patient understands these risk and wishes to proceed.  We will therefore proceed with catheter ablation at the next available time.  Carto, ICE, anesthesia are requested for  the procedure.  Will also obtain CT PV protocol prior to the procedure to exclude LAA thrombus and further evaluate atrial anatomy.  #Hypertension Controlled.  Total time spent with patient today 65 minutes. This includes reviewing records, evaluating the patient and coordinating care.  Medication Adjustments/Labs and Tests Ordered: Current medicines are reviewed at length with the patient today.  Concerns regarding medicines are outlined above.  Orders Placed This Encounter  Procedures   EKG 12-Lead   No orders of the defined types were placed in this encounter.    Signed, Rossie Muskrat. Lalla Brothers, MD, [facility], [facility] 07/31/2021 11:21 AM    Electrophysiology [facility]

## 2021-07-31 ENCOUNTER — Encounter: Payer: Self-pay | Admitting: Cardiology

## 2021-07-31 ENCOUNTER — Ambulatory Visit (INDEPENDENT_AMBULATORY_CARE_PROVIDER_SITE_OTHER): Payer: Medicaid Other | Admitting: Cardiology

## 2021-07-31 VITALS — BP 94/68 | HR 59 | Ht 66.0 in | Wt 169.0 lb

## 2021-07-31 DIAGNOSIS — I480 Paroxysmal atrial fibrillation: Secondary | ICD-10-CM | POA: Diagnosis not present

## 2021-07-31 DIAGNOSIS — I4892 Unspecified atrial flutter: Secondary | ICD-10-CM | POA: Diagnosis not present

## 2021-07-31 DIAGNOSIS — I10 Essential (primary) hypertension: Secondary | ICD-10-CM | POA: Diagnosis not present

## 2021-07-31 DIAGNOSIS — I4891 Unspecified atrial fibrillation: Secondary | ICD-10-CM | POA: Diagnosis not present

## 2021-07-31 NOTE — Patient Instructions (Addendum)
Medication Instructions:  Your physician recommends that you continue on your current medications as directed. Please refer to the Current Medication list given to you today. *If you need a refill on your cardiac medications before your next appointment, please call your pharmacy*  Lab Work: None ordered. If you have labs (blood work) drawn today and your tests are completely normal, you will receive your results only by: MyChart Message (if you have MyChart) OR A paper copy in the mail If you have any lab test that is abnormal or we need to change your treatment, we will call you to review the results.  Testing/Procedures: Your physician has recommended that you have an ablation. Catheter ablation is a medical procedure used to treat some cardiac arrhythmias (irregular heartbeats). During catheter ablation, a long, thin, flexible tube is put into a blood vessel in your groin (upper thigh), or neck. This tube is called an ablation catheter. It is then guided to your heart through the blood vessel. Radio frequency waves destroy small areas of heart tissue where abnormal heartbeats may cause an arrhythmia to start. Please see the instruction sheet given to you today.   Follow-Up post ablation (10/21/2021)-  You will follow up with the Afib clinic 4 weeks after your procedure. You will follow up with Dr. Lalla Brothers 3 months after your procedure.  Cardiac Ablation Cardiac ablation is a procedure to destroy, or ablate, a small amount of heart tissue in very specific places. The heart has many electrical connections. Sometimes these connections are abnormal and can cause the heart to beat very fast or irregularly. Ablating some of the areas that cause problems can improve the heart's rhythm or return it to normal. Ablation may be done for people who: Have Wolff-Parkinson-White syndrome. Have fast heart rhythms (tachycardia). Have taken medicines for an abnormal heart rhythm (arrhythmia) that were not  effective or caused side effects. Have a high-risk heartbeat that may be life-threatening. During the procedure, a small incision is made in the neck or the groin, and a long, thin tube (catheter) is inserted into the incision and moved to the heart. Small devices (electrodes) on the tip of the catheter will send out electrical currents. A type of X-ray (fluoroscopy) will be used to help guide the catheter and to provide images of the heart. Tell a health care provider about: Any allergies you have. All medicines you are taking, including vitamins, herbs, eye drops, creams, and over-the-counter medicines. Any problems you or family members have had with anesthetic medicines. Any blood disorders you have. Any surgeries you have had. Any medical conditions you have, such as kidney failure. Whether you are pregnant or may be pregnant. What are the risks? Generally, this is a safe procedure. However, problems may occur, including: Infection. Bruising and bleeding at the catheter insertion site. Bleeding into the chest, especially into the sac that surrounds the heart. This is a serious complication. Stroke or blood clots. Damage to nearby structures or organs. Allergic reaction to medicines or dyes. Need for a permanent pacemaker if the normal electrical system is damaged. A pacemaker is a small computer that sends electrical signals to the heart and helps your heart beat normally. The procedure not being fully effective. This may not be recognized until months later. Repeat ablation procedures are sometimes done. What happens before the procedure? Medicines Ask your health care provider about: Changing or stopping your regular medicines. This is especially important if you are taking diabetes medicines or blood thinners. Taking medicines such  as aspirin and ibuprofen. These medicines can thin your blood. Do not take these medicines unless your health care provider tells you to take  them. Taking over-the-counter medicines, vitamins, herbs, and supplements. General instructions Follow instructions from your health care provider about eating or drinking restrictions. Plan to have someone take you home from the hospital or clinic. If you will be going home right after the procedure, plan to have someone with you for 24 hours. Ask your health care provider what steps will be taken to prevent infection. What happens during the procedure?  An IV will be inserted into one of your veins. You will be given a medicine to help you relax (sedative). The skin on your neck or groin will be numbed. An incision will be made in your neck or your groin. A needle will be inserted through the incision and into a large vein in your neck or groin. A catheter will be inserted into the needle and moved to your heart. Dye may be injected through the catheter to help your surgeon see the area of the heart that needs treatment. Electrical currents will be sent from the catheter to ablate heart tissue in desired areas. There are three types of energy that may be used to do this: Heat (radiofrequency energy). Laser energy. Extreme cold (cryoablation). When the tissue has been ablated, the catheter will be removed. Pressure will be held on the insertion area to prevent a lot of bleeding. A bandage (dressing) will be placed over the insertion area. The exact procedure may vary among health care providers and hospitals. What happens after the procedure? Your blood pressure, heart rate, breathing rate, and blood oxygen level will be monitored until you leave the hospital or clinic. Your insertion area will be monitored for bleeding. You will need to lie still for a few hours to ensure that you do not bleed from the insertion area. Do not drive for 24 hours or as long as told by your health care provider. Summary Cardiac ablation is a procedure to destroy, or ablate, a small amount of heart tissue  using an electrical current. This procedure can improve the heart rhythm or return it to normal. Tell your health care provider about any medical conditions you may have and all medicines you are taking to treat them. This is a safe procedure, but problems may occur. Problems may include infection, bruising, damage to nearby organs or structures, or allergic reactions to medicines. Follow your health care provider's instructions about eating and drinking before the procedure. You may also be told to change or stop some of your medicines. After the procedure, do not drive for 24 hours or as long as told by your health care provider. This information is not intended to replace advice given to you by your health care provider. Make sure you discuss any questions you have with your health care provider. Document Revised: 08/08/2019 Document Reviewed: 08/08/2019 Elsevier Patient Education  2022 [employer].

## 2021-08-02 ENCOUNTER — Ambulatory Visit: Payer: Medicaid Other | Admitting: Medical

## 2021-08-06 ENCOUNTER — Telehealth: Payer: Self-pay

## 2021-08-06 ENCOUNTER — Other Ambulatory Visit: Payer: Self-pay

## 2021-08-06 ENCOUNTER — Ambulatory Visit (INDEPENDENT_AMBULATORY_CARE_PROVIDER_SITE_OTHER): Payer: Medicaid Other

## 2021-08-06 DIAGNOSIS — I480 Paroxysmal atrial fibrillation: Secondary | ICD-10-CM | POA: Diagnosis not present

## 2021-08-06 LAB — ECHOCARDIOGRAM COMPLETE
AR max vel: 2.89 cm2
AV Area VTI: 2.57 cm2
AV Area mean vel: 2.89 cm2
AV Mean grad: 3 mmHg
AV Peak grad: 5.1 mmHg
Ao pk vel: 1.13 m/s
Area-P 1/2: 3.23 cm2
Calc EF: 54.1 %
S' Lateral: 3.6 cm
Single Plane A2C EF: 49.1 %
Single Plane A4C EF: 57.5 %

## 2021-08-06 NOTE — Telephone Encounter (Signed)
Per Pt-he would like to have pre procedure labs drawn at his PCP's office.  Outreach made to [employer] office.  Will fax orders to them.  Pt should not get lab work before end of December-will document in fax.  Will advise Pt.  Work up complete.

## 2021-08-07 ENCOUNTER — Telehealth: Payer: Self-pay | Admitting: *Deleted

## 2021-08-07 NOTE — Telephone Encounter (Signed)
Attempted to call pt. No answer. Lmtcb.  

## 2021-08-07 NOTE — Telephone Encounter (Signed)
Patient is returning your call.  

## 2021-08-07 NOTE — Telephone Encounter (Signed)
-----   Message from Yvonne Kendall, MD sent at 08/07/2021  9:10 AM EDT ----- Please let Patrick Short know that his echo shows that his heart is contracting normally.  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  These are common findings.  I recommend that he continue his current medications and follow-up with [place] and Dr. Fredderick Erb clinic as previously arranged.

## 2021-08-07 NOTE — Telephone Encounter (Signed)
The patient has been notified of the result and verbalized understanding.  All questions (if any) were answered.     

## 2021-08-08 ENCOUNTER — Telehealth: Payer: Self-pay | Admitting: Nurse Practitioner

## 2021-08-08 NOTE — Telephone Encounter (Signed)
..   Medicaid Managed Care   Unsuccessful Outreach Note  08/08/2021 Name: Patrick Short MRN: [id number] DOB: 05-31-1976  Referred by: Larae Grooms, NP Reason for referral : High Risk Managed Medicaid (I called the patient today to get him scheduled for a phone visit with the [facility] team. I left my name and number on his VM.)   An unsuccessful telephone outreach was attempted today. The patient was referred to the case management team for assistance with care management and care coordination.   Follow Up Plan: The care management team will reach out to the patient again over the next 7-14 days.   Daine Gip Care Guide, High Risk Medicaid Managed Care Embedded Care Coordination [facility]  [facility]

## 2021-08-15 ENCOUNTER — Ambulatory Visit: Payer: Medicaid Other | Admitting: Psychology

## 2021-08-27 ENCOUNTER — Telehealth: Payer: Self-pay | Admitting: Nurse Practitioner

## 2021-08-27 NOTE — Telephone Encounter (Signed)
..  Patient declines further follow up and engagement by the Managed Medicaid Team. Appropriate care team members and provider have been notified via electronic communication. The Managed Medicaid Team is available to follow up with the patient after provider conversation with the patient regarding recommendation for engagement and subsequent re-referral to the Managed Medicaid Team.    Jennifer Alley Care Guide, High Risk Medicaid Managed Care Embedded Care Coordination   [facility]   

## 2021-08-29 ENCOUNTER — Encounter: Payer: Medicaid Other | Attending: Psychology | Admitting: Psychology

## 2021-08-29 DIAGNOSIS — G90523 Complex regional pain syndrome I of lower limb, bilateral: Secondary | ICD-10-CM | POA: Insufficient documentation

## 2021-08-29 DIAGNOSIS — F418 Other specified anxiety disorders: Secondary | ICD-10-CM | POA: Insufficient documentation

## 2021-08-29 DIAGNOSIS — F4312 Post-traumatic stress disorder, chronic: Secondary | ICD-10-CM | POA: Insufficient documentation

## 2021-08-29 DIAGNOSIS — G2581 Restless legs syndrome: Secondary | ICD-10-CM | POA: Insufficient documentation

## 2021-09-10 ENCOUNTER — Ambulatory Visit (INDEPENDENT_AMBULATORY_CARE_PROVIDER_SITE_OTHER): Payer: Medicaid Other | Admitting: Nurse Practitioner

## 2021-09-10 ENCOUNTER — Other Ambulatory Visit: Payer: Self-pay | Admitting: Nurse Practitioner

## 2021-09-10 ENCOUNTER — Other Ambulatory Visit: Payer: Self-pay

## 2021-09-10 ENCOUNTER — Encounter: Payer: Self-pay | Admitting: Nurse Practitioner

## 2021-09-10 VITALS — BP 115/67 | HR 51 | Temp 98.1°F | Ht 65.0 in | Wt 175.5 lb

## 2021-09-10 DIAGNOSIS — Z23 Encounter for immunization: Secondary | ICD-10-CM

## 2021-09-10 DIAGNOSIS — F419 Anxiety disorder, unspecified: Secondary | ICD-10-CM

## 2021-09-10 DIAGNOSIS — F3289 Other specified depressive episodes: Secondary | ICD-10-CM

## 2021-09-10 DIAGNOSIS — G479 Sleep disorder, unspecified: Secondary | ICD-10-CM | POA: Diagnosis not present

## 2021-09-10 DIAGNOSIS — I10 Essential (primary) hypertension: Secondary | ICD-10-CM | POA: Diagnosis not present

## 2021-09-10 DIAGNOSIS — I4891 Unspecified atrial fibrillation: Secondary | ICD-10-CM

## 2021-09-10 MED ORDER — TRAZODONE HCL 50 MG PO TABS: 25.0000 mg | ORAL_TABLET | Freq: Every evening | ORAL | 1 refills | Status: DC | PRN

## 2021-09-10 MED ORDER — BUPROPION HCL ER (XL) 300 MG PO TB24: 300.0000 mg | ORAL_TABLET | Freq: Every day | ORAL | 1 refills | Status: DC

## 2021-09-10 MED ORDER — VALSARTAN-HYDROCHLOROTHIAZIDE 160-12.5 MG PO TABS: 1.0000 | ORAL_TABLET | Freq: Every day | ORAL | 1 refills | Status: DC

## 2021-09-10 NOTE — Progress Notes (Signed)
BP 115/67   Pulse (!) 51   Temp 98.1 F (36.7 C) (Oral)   Ht 5\' 5"  (1.651 m)   Wt 175 lb 8 oz (79.6 kg)   SpO2 98%   BMI 29.20 kg/m    Subjective:    Patient ID: Patrick Short, male    DOB: 1976/08/03, 45 y.o.   MRN: [id number]  HPI: Patrick Short is a 45 y.o. male  Chief Complaint  Patient presents with   Atrial Fibrillation    AFIB Patient states he saw Cardiology and he is going to have an Ablation on January 9.  On December 29 he will have an ECHO.  He is still on Eliquis and Metoprolol.  SLEEP DISTURBANCE Patient states he has been having trouble sleeping.  Patient has been taking Melatonin and benadryl makes him hyper but it isn't working.  Patient has not tried anything else.    Denies HA, CP, SOB, dizziness, palpitations, visual changes, and lower extremity swelling.  Relevant past medical, surgical, family and social history reviewed and updated as indicated. Interim medical history since our last visit reviewed. Allergies and medications reviewed and updated.  Review of Systems  Eyes:  Negative for visual disturbance.  Respiratory:  Negative for shortness of breath.   Cardiovascular:  Negative for chest pain and leg swelling.  Neurological:  Negative for light-headedness and headaches.  Psychiatric/Behavioral:  Positive for sleep disturbance. The patient is not nervous/anxious.    Per HPI unless specifically indicated above     Objective:    BP 115/67   Pulse (!) 51   Temp 98.1 F (36.7 C) (Oral)   Ht 5\' 5"  (1.651 m)   Wt 175 lb 8 oz (79.6 kg)   SpO2 98%   BMI 29.20 kg/m   Wt Readings from Last 3 Encounters:  09/10/21 175 lb 8 oz (79.6 kg)  07/31/21 169 lb (76.7 kg)  07/19/21 172 lb 8 oz (78.2 kg)    Physical Exam Vitals and nursing note reviewed.  Constitutional:      General: He is not in acute distress.    Appearance: Normal appearance. He is not ill-appearing, toxic-appearing or diaphoretic.  HENT:     Head: Normocephalic.     Right Ear:  External ear normal.     Left Ear: External ear normal.     Nose: Nose normal. No congestion or rhinorrhea.     Mouth/Throat:     Mouth: Mucous membranes are moist.  Eyes:     General:        Right eye: No discharge.        Left eye: No discharge.     Extraocular Movements: Extraocular movements intact.     Conjunctiva/sclera: Conjunctivae normal.     Pupils: Pupils are equal, round, and reactive to light.  Cardiovascular:     Rate and Rhythm: Normal rate and regular rhythm.     Heart sounds: No murmur heard. Pulmonary:     Effort: Pulmonary effort is normal. No respiratory distress.     Breath sounds: Normal breath sounds. No wheezing, rhonchi or rales.  Abdominal:     General: Abdomen is flat. Bowel sounds are normal.  Musculoskeletal:     Cervical back: Normal range of motion and neck supple.  Skin:    General: Skin is warm and dry.     Capillary Refill: Capillary refill takes less than 2 seconds.  Neurological:     General: No focal deficit present.  Mental Status: He is alert and oriented to person, place, and time.  Psychiatric:        Mood and Affect: Mood normal.        Behavior: Behavior normal.        Thought Content: Thought content normal.        Judgment: Judgment normal.    Results for orders placed or performed in visit on 08/06/21  ECHOCARDIOGRAM COMPLETE  Result Value Ref Range   AR max vel 2.89 cm2   AV Peak grad 5.1 mmHg   Ao pk vel 1.13 m/s   S' Lateral 3.60 cm   Area-P 1/2 3.23 cm2   AV Area VTI 2.57 cm2   AV Mean grad 3.0 mmHg   Single Plane A4C EF 57.5 %   Single Plane A2C EF 49.1 %   Calc EF 54.1 %   AV Area mean vel 2.89 cm2      Assessment & Plan:   Problem List Items Addressed This Visit       Cardiovascular and Mediastinum   Essential hypertension    Chronic.  Controlled.  Continue with current medication regimen on Valsartan and HCTZ.  Labs ordered today.  Return to clinic in 6 months for reevaluation.  Call sooner if concerns  arise.        Relevant Medications   valsartan-hydrochlorothiazide (DIOVAN-HCT) 160-12.5 MG tablet   Atrial fibrillation (HCC) - Primary    Chronic.  Controlled.  Continue with current medication regimen on Eliquis 5mg  BID and Metoprolol 25mg  BID.  Has Ablation scheduled for January.  Return to clinic in 6 months for reevaluation.  Call sooner if concerns arise.        Relevant Medications   valsartan-hydrochlorothiazide (DIOVAN-HCT) 160-12.5 MG tablet     Other   Depression    Chronic. Improved.  Zoloft is helping with his anxiety.  Denies concerns about medication or anxiety at visit today.  Refills sent. He is currently seeing for therapy.       Relevant Medications   traZODone (DESYREL) 50 MG tablet   buPROPion (WELLBUTRIN XL) 300 MG 24 hr tablet   Anxiety    Chronic. Improved.  Zoloft is helping with his anxiety.  Denies concerns about medication or anxiety at visit today.  Refills sent. He is currently seeing February for therapy.       Relevant Medications   traZODone (DESYREL) 50 MG tablet   buPROPion (WELLBUTRIN XL) 300 MG 24 hr tablet   Sleep disturbance    Not well controlled.  Has tried benadryl and Melatonin which did not work for him.  Will try Trazodone at 50mg  daily. Can take 1/2 or increase to 2 tabs if needed.  Side effects and benefits of medication discussed with patient during visit.  Follow up in 2 weeks for reevaluation.      Other Visit Diagnoses     Need for influenza vaccination       Relevant Orders   Flu Vaccine QUAD 6+ mos PF IM (Fluarix Quad PF) (Completed)   Need for vaccination against Streptococcus pneumoniae       Relevant Orders   Pneumococcal polysaccharide vaccine 23-valent greater than or equal to 2yo subcutaneous/IM (Completed)        Follow up plan: Return in about 2 weeks (around 09/24/2021) for Cardiology Pre Op.

## 2021-09-10 NOTE — Assessment & Plan Note (Signed)
Chronic. Improved.  Zoloft is helping with his anxiety.  Denies concerns about medication or anxiety at visit today.  Refills sent. He is currently seeing Arley Phenix for therapy.

## 2021-09-10 NOTE — Assessment & Plan Note (Signed)
Chronic.  Controlled.  Continue with current medication regimen on Valsartan and HCTZ.  Labs ordered today.  Return to clinic in 6 months for reevaluation.  Call sooner if concerns arise.

## 2021-09-10 NOTE — Assessment & Plan Note (Signed)
Not well controlled.  Has tried benadryl and Melatonin which did not work for him.  Will try Trazodone at 50mg  daily. Can take 1/2 or increase to 2 tabs if needed.  Side effects and benefits of medication discussed with patient during visit.  Follow up in 2 weeks for reevaluation.

## 2021-09-10 NOTE — Assessment & Plan Note (Signed)
Chronic.  Controlled.  Continue with current medication regimen on Eliquis 5mg  BID and Metoprolol 25mg  BID.  Has Ablation scheduled for January.  Return to clinic in 6 months for reevaluation.  Call sooner if concerns arise.

## 2021-09-12 ENCOUNTER — Encounter: Payer: Medicaid Other | Attending: Psychology | Admitting: Psychology

## 2021-09-12 DIAGNOSIS — F418 Other specified anxiety disorders: Secondary | ICD-10-CM | POA: Insufficient documentation

## 2021-09-12 DIAGNOSIS — F4312 Post-traumatic stress disorder, chronic: Secondary | ICD-10-CM | POA: Insufficient documentation

## 2021-09-12 DIAGNOSIS — G90523 Complex regional pain syndrome I of lower limb, bilateral: Secondary | ICD-10-CM | POA: Insufficient documentation

## 2021-09-12 DIAGNOSIS — G2581 Restless legs syndrome: Secondary | ICD-10-CM | POA: Insufficient documentation

## 2021-09-12 NOTE — Telephone Encounter (Signed)
I called [facility] [phone] to see why we received a duplicate request for the Wellbutrin 300 mg and the Diovan 160-12.5 mg   They were signed on 09/10/2021 for #90 with 1 refill for both.  I spoke with Luther Parody and she said we could disregard those requests so I refused them.

## 2021-09-23 ENCOUNTER — Other Ambulatory Visit: Payer: Self-pay

## 2021-09-23 ENCOUNTER — Encounter: Payer: Self-pay | Admitting: Nurse Practitioner

## 2021-09-23 ENCOUNTER — Ambulatory Visit (INDEPENDENT_AMBULATORY_CARE_PROVIDER_SITE_OTHER): Payer: Medicaid Other | Admitting: Nurse Practitioner

## 2021-09-23 VITALS — BP 118/68 | HR 60 | Temp 98.6°F | Wt 187.0 lb

## 2021-09-23 DIAGNOSIS — F419 Anxiety disorder, unspecified: Secondary | ICD-10-CM

## 2021-09-23 DIAGNOSIS — I10 Essential (primary) hypertension: Secondary | ICD-10-CM

## 2021-09-23 DIAGNOSIS — I4891 Unspecified atrial fibrillation: Secondary | ICD-10-CM

## 2021-09-23 DIAGNOSIS — Z720 Tobacco use: Secondary | ICD-10-CM | POA: Diagnosis not present

## 2021-09-23 DIAGNOSIS — F3289 Other specified depressive episodes: Secondary | ICD-10-CM

## 2021-09-23 DIAGNOSIS — E7800 Pure hypercholesterolemia, unspecified: Secondary | ICD-10-CM

## 2021-09-23 DIAGNOSIS — Z01818 Encounter for other preprocedural examination: Secondary | ICD-10-CM

## 2021-09-23 LAB — URINALYSIS, ROUTINE W REFLEX MICROSCOPIC
Bilirubin, UA: NEGATIVE
Glucose, UA: NEGATIVE
Ketones, UA: NEGATIVE
Leukocytes,UA: NEGATIVE
Nitrite, UA: NEGATIVE
Protein,UA: NEGATIVE
RBC, UA: NEGATIVE
Specific Gravity, UA: 1.02 (ref 1.005–1.030)
Urobilinogen, Ur: 0.2 mg/dL (ref 0.2–1.0)
pH, UA: 6 (ref 5.0–7.5)

## 2021-09-23 NOTE — Progress Notes (Signed)
Hi Patrick Short. Your urine from today looks good.  I will send you another message once the rest of your lab work comes back.

## 2021-09-23 NOTE — Assessment & Plan Note (Signed)
Has recently stopped smoking. Recommend continued smoking cessation.

## 2021-09-23 NOTE — Assessment & Plan Note (Signed)
Chronic. Controlled.  Denies concerns about medication or anxiety at visit today. He is currently seeing Arley Phenix for therapy. Follow up in 3 months for reevaluation.

## 2021-09-23 NOTE — Assessment & Plan Note (Signed)
Chronic.  Controlled.  Continue with current medication regimen on Valsartan 160mg  and HCTZ 12.5mg  daily.  Labs ordered today.  Return to clinic in 3 months for reevaluation.  Call sooner if concerns arise.

## 2021-09-23 NOTE — Assessment & Plan Note (Signed)
Chronic.  Controlled.  Continue with current medication regimen of Eluqius 5mg  BID and Metoprolol 25mg  BID.  Speak to Cardiology regarding when to stop Eliquis. Labs ordered today.  Return to clinic in 3 months for reevaluation.  Call sooner if concerns arise.

## 2021-09-23 NOTE — Assessment & Plan Note (Signed)
Chronic. Controlled. Labs ordered today. Will make recommendations based on lab results.   

## 2021-09-23 NOTE — Progress Notes (Signed)
BP 118/68   Pulse 60   Temp 98.6 F (37 C) (Oral)   Wt 187 lb (84.8 kg)   SpO2 97%   BMI 31.12 kg/m    Subjective:    Patient ID: Patrick Short, male    DOB: 1975/10/17, 45 y.o.   MRN: [id number]  HPI: Patrick Short is a 45 y.o. male  Chief Complaint  Patient presents with   Surgery Clearance   09/23/2021  Assessment:  Preoperative evaluation and clearance show: No contraindications to planned surgery and patient is cleared for diagnosis for surgical procedure.  Other diagnoses affecting surgical risk include: Other Recent Tobacco use, high cholesterol, Depression, anxiety, HTN, and A FIB.   Type of Surgery:  Intermediate, 1% - 5% cardiac risk (major intraabdominal, intrathoracic, orthopedic, major head & neck, prostatectomy)   Lee's Revised Cardiac Index: 0 Risk class I, very low, 0.4% risk of cardiac complications  Plan:  1. Patient requires endocarditis prophylaxis: no. 2. GENERAL PREOP INSTRUCTIONS: Proceed with surgery as planned. No food or liquids the morning of surgery. Call surgeon if develops respiratory illness, fever, or other illness. 3. Medications to Hold: NSAIDS for 7 days before surgery, unless instructed otherwise by surgeon.  Please see Cardiology for insructions on when to stop Eliquis 4. EKG:  07/31/2021 : No acute ST changes. 5. Ordered Labs: CMP, CBC, LIPID, UA  From a medical standpoint the patient is an acceptable candidates to undergo general anesthesia for surgery. It is recommended to correct electrolytes and to keep the patient euvolemic and avoid significant fluid shifts during surgery.  Labs reviewed and pt is cleared for surgery. Pt denies a hx of adverse reactions to anesthesia.    Patrick Short is a 45 y.o. male who presents to the office today for a preoperative consultation at the request of Dr. Quentin Ore, who will perform a  Cardiac Ablation on 10/21/2021.  Current Complaints: None  Past Medical History:  Diagnosis Date   Anxiety     Depression    Hypertension    Mineral deficiency    PTSD (post-traumatic stress disorder)    Family History  Problem Relation Age of Onset   Hypertension Mother    Atrial fibrillation Mother    Hypertension Father    Heart disease Maternal Grandmother    Hypertension Maternal Grandmother    Hypertension Paternal Grandfather    Coronary artery disease Paternal Grandfather    Other Son        Trisomy 21   Current Outpatient Medications  Medication Sig Dispense Refill   apixaban (ELIQUIS) 5 MG TABS tablet Take 1 tablet (5 mg total) by mouth 2 (two) times daily. 180 tablet 0   buPROPion (WELLBUTRIN XL) 300 MG 24 hr tablet Take 1 tablet (300 mg total) by mouth daily. 90 tablet 1   EPINEPHrine (EPIPEN 2-PAK) 0.3 mg/0.3 mL IJ SOAJ injection Inject 0.3 mg into the muscle as needed for anaphylaxis. 1 each 1   metoprolol tartrate (LOPRESSOR) 25 MG tablet Take 1 tablet (25 mg total) by mouth 2 (two) times daily. 30 tablet 5   rOPINIRole (REQUIP) 1 MG tablet Take 1 tablet (1 mg total) by mouth at bedtime. 90 tablet 1   sertraline (ZOLOFT) 25 MG tablet Take 1 tablet (25 mg total) by mouth daily. 90 tablet 1   traZODone (DESYREL) 50 MG tablet Take 0.5-1 tablets (25-50 mg total) by mouth at bedtime as needed for sleep. 90 tablet 1   valsartan-hydrochlorothiazide (DIOVAN-HCT) 160-12.5 MG tablet  Take 1 tablet by mouth daily. 90 tablet 1   No current facility-administered medications for this visit.   Allergies  Allergen Reactions   Aspirin Shortness Of Breath   Bee Venom Anaphylaxis   Sulfa Antibiotics Anaphylaxis   Penicillins Rash   Social History   Socioeconomic History   Marital status: Single    Spouse name: Not on file   Number of children: Not on file   Years of education: Not on file   Highest education level: Not on file  Occupational History   Not on file  Tobacco Use   Smoking status: Former    Packs/day: 1.00    Years: 25.00    Pack years: 25.00    Types: Cigarettes    Smokeless tobacco: Never  Vaping Use   Vaping Use: Never used  Substance and Sexual Activity   Alcohol use: Not Currently   Drug use: Yes    Frequency: 7.0 times per week    Types: Marijuana   Sexual activity: Yes  Other Topics Concern   Not on file  Social History Narrative   Not on file   Social Determinants of Health   Financial Resource Strain: Not on file  Food Insecurity: Not on file  Transportation Needs: Not on file  Physical Activity: Not on file  Stress: Not on file  Social Connections: Not on file  Intimate Partner Violence: Not on file     Preoperative Risk Factors  1. Major predictors that require intensive management and may lead to delay in or cancellation of the operative procedure unless emergent:  No Unstable coronary syndromes including unstable or severe angina or recent MI  No Decompensated heart failure including NYHA functional class 4 or worsening or new onset HF  no Significant arrhythmia including high grade AV block, symptomatic ventricular arrhythmias, supraventricular arrhythmias with a ventricular rate >100 bpm at rest, symptomatic bradycardia, and newly recognized ventricular tachycardia  no Severe heart valve disease including severe aortic stenosis or symptomatic mitral stenosis  2. Additional independent predictors of major cardiac complications:  noHgh risk type of surgery (Vascular surgery and any open intraperitoneal or intrathoracic procedures)  Other clinical predictors that warrant careful assessment of current status  No History of ischemic heart disease No History of CVA No  History of compensated heart failure or prior heart failure No Diabetes mellitus on Insulin No Renal insufficiency  3. Perioperative cardiac and long term risk is increased in pts unable to meet a 4-MET demand during most normal daily activities:  Yes Ability to climb 2 flights of stairs or walk four blocks  Objective:  BP 118/68   Pulse 60    Temp 98.6 F (37 C) (Oral)   Wt 187 lb (84.8 kg)   SpO2 97%   BMI 31.12 kg/m   Body mass index is 31.12 kg/m.     Patrick Short 09/23/21  Relevant past medical, surgical, family and social history reviewed and updated as indicated. Interim medical history since our last visit reviewed. Allergies and medications reviewed and updated.  Review of Systems  Eyes:  Negative for visual disturbance.  Respiratory:  Negative for chest tightness and shortness of breath.   Cardiovascular:  Negative for chest pain, palpitations and leg swelling.  Neurological:  Negative for dizziness, light-headedness and headaches.   Per HPI unless specifically indicated above     Objective:    BP 118/68   Pulse 60   Temp 98.6 F (37 C) (Oral)   Wt 187 lb (  84.8 kg)   SpO2 97%   BMI 31.12 kg/m   Wt Readings from Last 3 Encounters:  09/23/21 187 lb (84.8 kg)  09/10/21 175 lb 8 oz (79.6 kg)  07/31/21 169 lb (76.7 kg)    Physical Exam Vitals and nursing note reviewed.  Constitutional:      General: He is not in acute distress.    Appearance: Normal appearance. He is not ill-appearing, toxic-appearing or diaphoretic.  HENT:     Head: Normocephalic.     Right Ear: External ear normal.     Left Ear: External ear normal.     Nose: Nose normal. No congestion or rhinorrhea.     Mouth/Throat:     Mouth: Mucous membranes are moist.  Eyes:     General:        Right eye: No discharge.        Left eye: No discharge.     Extraocular Movements: Extraocular movements intact.     Conjunctiva/sclera: Conjunctivae normal.     Pupils: Pupils are equal, round, and reactive to light.  Cardiovascular:     Rate and Rhythm: Normal rate and regular rhythm.     Heart sounds: No murmur heard. Pulmonary:     Effort: Pulmonary effort is normal. No respiratory distress.     Breath sounds: Normal breath sounds. No wheezing, rhonchi or rales.  Abdominal:     General: Abdomen is flat. Bowel sounds are normal.   Musculoskeletal:     Cervical back: Normal range of motion and neck supple.  Skin:    General: Skin is warm and dry.     Capillary Refill: Capillary refill takes less than 2 seconds.  Neurological:     General: No focal deficit present.     Mental Status: He is alert and oriented to person, place, and time.  Psychiatric:        Mood and Affect: Mood normal.        Behavior: Behavior normal.        Thought Content: Thought content normal.        Judgment: Judgment normal.    Results for orders placed or performed in visit on 08/06/21  ECHOCARDIOGRAM COMPLETE  Result Value Ref Range   AR max vel 2.89 cm2   AV Peak grad 5.1 mmHg   Ao pk vel 1.13 m/s   S' Lateral 3.60 cm   Area-P 1/2 3.23 cm2   AV Area VTI 2.57 cm2   AV Mean grad 3.0 mmHg   Single Plane A4C EF 57.5 %   Single Plane A2C EF 49.1 %   Calc EF 54.1 %   AV Area mean vel 2.89 cm2      Assessment & Plan:   Problem List Items Addressed This Visit       Cardiovascular and Mediastinum   Essential hypertension    Chronic.  Controlled.  Continue with current medication regimen on Valsartan 170m and HCTZ 12.568mdaily.  Labs ordered today.  Return to clinic in 3 months for reevaluation.  Call sooner if concerns arise.        Relevant Orders   Comp Met (CMET)   Atrial fibrillation (HCC)    Chronic.  Controlled.  Continue with current medication regimen of Eluqius 40m37mID and Metoprolol 240m52mD.  Speak to Cardiology regarding when to stop Eliquis. Labs ordered today.  Return to clinic in 3 months for reevaluation.  Call sooner if concerns arise.  Relevant Orders   CBC w/Diff     Other   Tobacco abuse    Has recently stopped smoking. Recommend continued smoking cessation.       Depression    Chronic. Controlled.  Denies concerns about medication or anxiety at visit today. He is currently seeing Ilean Skill for therapy. Follow up in 3 months for reevaluation.      Anxiety   Elevated LDL  cholesterol level    Chronic. Controlled.  Labs ordered today. Will make recommendations based on lab results.       Relevant Orders   Lipid Profile   Other Visit Diagnoses     Pre-op exam    -  Primary   Relevant Orders   Comp Met (CMET)   CBC w/Diff   Lipid Profile   Urinalysis, Routine w reflex microscopic        Follow up plan: Return in about 3 months (around 12/22/2021) for Depression/Anxiety FU, HTN, HLD, DM2 FU.

## 2021-09-24 ENCOUNTER — Ambulatory Visit: Payer: Medicaid Other | Admitting: Nurse Practitioner

## 2021-09-24 LAB — CBC WITH DIFFERENTIAL/PLATELET
Basophils Absolute: 0.1 10*3/uL (ref 0.0–0.2)
Basos: 1 %
EOS (ABSOLUTE): 0.2 10*3/uL (ref 0.0–0.4)
Eos: 3 %
Hematocrit: 45.6 % (ref 37.5–51.0)
Hemoglobin: 15.5 g/dL (ref 13.0–17.7)
Immature Grans (Abs): 0 10*3/uL (ref 0.0–0.1)
Immature Granulocytes: 0 %
Lymphocytes Absolute: 2.1 10*3/uL (ref 0.7–3.1)
Lymphs: 23 %
MCH: 33.2 pg — ABNORMAL HIGH (ref 26.6–33.0)
MCHC: 34 g/dL (ref 31.5–35.7)
MCV: 98 fL — ABNORMAL HIGH (ref 79–97)
Monocytes Absolute: 0.8 10*3/uL (ref 0.1–0.9)
Monocytes: 9 %
Neutrophils Absolute: 5.8 10*3/uL (ref 1.4–7.0)
Neutrophils: 64 %
Platelets: 196 10*3/uL (ref 150–450)
RBC: 4.67 x10E6/uL (ref 4.14–5.80)
RDW: 12.5 % (ref 11.6–15.4)
WBC: 9.1 10*3/uL (ref 3.4–10.8)

## 2021-09-24 LAB — COMPREHENSIVE METABOLIC PANEL
ALT: 26 IU/L (ref 0–44)
AST: 20 IU/L (ref 0–40)
Albumin/Globulin Ratio: 1.8 (ref 1.2–2.2)
Albumin: 4.3 g/dL (ref 4.0–5.0)
Alkaline Phosphatase: 60 IU/L (ref 44–121)
BUN/Creatinine Ratio: 13 (ref 9–20)
BUN: 14 mg/dL (ref 6–24)
Bilirubin Total: 0.3 mg/dL (ref 0.0–1.2)
CO2: 25 mmol/L (ref 20–29)
Calcium: 9.6 mg/dL (ref 8.7–10.2)
Chloride: 103 mmol/L (ref 96–106)
Creatinine, Ser: 1.11 mg/dL (ref 0.76–1.27)
Globulin, Total: 2.4 g/dL (ref 1.5–4.5)
Glucose: 85 mg/dL (ref 70–99)
Potassium: 3.9 mmol/L (ref 3.5–5.2)
Sodium: 142 mmol/L (ref 134–144)
Total Protein: 6.7 g/dL (ref 6.0–8.5)
eGFR: 83 mL/min/{1.73_m2} (ref >59)

## 2021-09-24 LAB — LIPID PANEL
Chol/HDL Ratio: 5.2 ratio — ABNORMAL HIGH (ref 0.0–5.0)
Cholesterol, Total: 197 mg/dL (ref 100–199)
HDL: 38 mg/dL — ABNORMAL LOW (ref >39)
LDL Chol Calc (NIH): 128 mg/dL — ABNORMAL HIGH (ref 0–99)
Triglycerides: 171 mg/dL — ABNORMAL HIGH (ref 0–149)
VLDL Cholesterol Cal: 31 mg/dL (ref 5–40)

## 2021-09-24 NOTE — Progress Notes (Signed)
Hi Patrick Short.  Your lab work looks good.  I have cleared you for surgery!

## 2021-09-26 ENCOUNTER — Encounter: Payer: Medicaid Other | Admitting: Psychology

## 2021-09-27 ENCOUNTER — Other Ambulatory Visit: Payer: Self-pay | Admitting: Nurse Practitioner

## 2021-09-27 NOTE — Telephone Encounter (Signed)
Requested Prescriptions  Pending Prescriptions Disp Refills   ELIQUIS 5 MG TABS tablet [Pharmacy Med Name: ELIQUIS 5 MG TABLET] 180 tablet 0    Sig: TAKE 1 TABLET BY MOUTH TWICE A DAY     Hematology:  Anticoagulants Passed - 09/27/2021  1:30 AM      Passed - HGB in normal range and within 360 days    Hemoglobin  Date Value Ref Range Status  09/23/2021 15.5 13.0 - 17.7 g/dL Final         Passed - PLT in normal range and within 360 days    Platelets  Date Value Ref Range Status  09/23/2021 196 150 - 450 x10E3/uL Final         Passed - HCT in normal range and within 360 days    Hematocrit  Date Value Ref Range Status  09/23/2021 45.6 37.5 - 51.0 % Final         Passed - Cr in normal range and within 360 days    Creatinine, Ser  Date Value Ref Range Status  09/23/2021 1.11 0.76 - 1.27 mg/dL Final         Passed - Valid encounter within last 12 months    Recent Outpatient Visits          4 days ago Pre-op exam   [facility] Larae Grooms, NP   2 weeks ago Atrial fibrillation, unspecified type [facility])   [facility] Larae Grooms, NP   2 months ago Atrial fibrillation, unspecified type [facility])   [facility] Larae Grooms, NP   2 months ago Palpitation   [facility] McElwee, Lauren A, NP   3 months ago Low blood potassium   [facility] Larae Grooms, NP      Future Appointments            In 4 weeks End, Cristal Deer, MD [facility]   In 2 months Larae Grooms, NP [facility], [facility]

## 2021-10-09 ENCOUNTER — Other Ambulatory Visit: Payer: Self-pay | Admitting: Nurse Practitioner

## 2021-10-09 ENCOUNTER — Telehealth (HOSPITAL_COMMUNITY): Payer: Self-pay | Admitting: *Deleted

## 2021-10-09 NOTE — Telephone Encounter (Signed)
Reaching out to patient to offer assistance regarding upcoming cardiac imaging study; pt verbalizes understanding of appt date/time, parking situation and where to check in, pre-test NPO status  and verified current allergies; name and call back number provided for further questions should they arise ? ?Danyka Merlin RN Navigator Cardiac Imaging ?[facility] ?[phone] office ?[phone] cell ? ?

## 2021-10-09 NOTE — Telephone Encounter (Signed)
Spoke with Clovis Riley at [facility] pharmacy to confirm pt received refills. Eliquis 5mg  was picked up and ropinirole 1mg  is ready for pick up.

## 2021-10-10 ENCOUNTER — Other Ambulatory Visit: Payer: Self-pay

## 2021-10-10 ENCOUNTER — Encounter: Payer: Self-pay | Admitting: Cardiology

## 2021-10-10 ENCOUNTER — Ambulatory Visit
Admission: RE | Admit: 2021-10-10 | Discharge: 2021-10-10 | Disposition: A | Discharge status: Home / Self Care | Payer: Medicaid Other | Source: Ambulatory Visit | Attending: Cardiology | Admitting: Cardiology

## 2021-10-10 ENCOUNTER — Telehealth: Payer: Self-pay | Admitting: Cardiology

## 2021-10-10 DIAGNOSIS — I4891 Unspecified atrial fibrillation: Secondary | ICD-10-CM | POA: Diagnosis not present

## 2021-10-10 HISTORY — DX: Cardiac arrhythmia, unspecified: I49.9

## 2021-10-10 MED ORDER — IOHEXOL 350 MG/ML SOLN
100.0000 mL | Freq: Once | INTRAVENOUS | Status: AC | PRN
  Administered 2021-10-10: 12:00:00 100 mL via INTRAVENOUS

## 2021-10-10 NOTE — Telephone Encounter (Signed)
Diane from [facility] states Dr. Nadene Rubins is requesting to speak with Dr. Lalla Brothers.

## 2021-10-10 NOTE — Progress Notes (Signed)
Patient tolerated procedure well. Ambulate w/o difficulty. Denies light headedness or being dizzy. Encouraged to drink extra water today and reasoning explained. Verbalized understanding. All questions answered. ABC intact. No further needs. Discharge from procedure area w/o issues.   

## 2021-10-10 NOTE — Telephone Encounter (Signed)
Dr. Lalla Brothers took call from Dr. Nadene Rubins.

## 2021-10-13 ENCOUNTER — Emergency Department
Admission: EM | Admit: 2021-10-13 | Discharge: 2021-10-13 | Disposition: A | Discharge status: Home / Self Care | Payer: Medicaid Other | Attending: Emergency Medicine | Admitting: Emergency Medicine

## 2021-10-13 ENCOUNTER — Encounter: Payer: Self-pay | Admitting: Emergency Medicine

## 2021-10-13 ENCOUNTER — Emergency Department: Payer: Medicaid Other

## 2021-10-13 ENCOUNTER — Other Ambulatory Visit: Payer: Self-pay

## 2021-10-13 DIAGNOSIS — R0602 Shortness of breath: Secondary | ICD-10-CM | POA: Insufficient documentation

## 2021-10-13 DIAGNOSIS — I4891 Unspecified atrial fibrillation: Secondary | ICD-10-CM | POA: Diagnosis not present

## 2021-10-13 DIAGNOSIS — Z7901 Long term (current) use of anticoagulants: Secondary | ICD-10-CM | POA: Diagnosis not present

## 2021-10-13 DIAGNOSIS — R0789 Other chest pain: Secondary | ICD-10-CM | POA: Diagnosis not present

## 2021-10-13 DIAGNOSIS — R079 Chest pain, unspecified: Secondary | ICD-10-CM

## 2021-10-13 DIAGNOSIS — I491 Atrial premature depolarization: Secondary | ICD-10-CM | POA: Diagnosis not present

## 2021-10-13 DIAGNOSIS — R42 Dizziness and giddiness: Secondary | ICD-10-CM | POA: Diagnosis not present

## 2021-10-13 LAB — CBC
HCT: 43.8 % (ref 39.0–52.0)
Hemoglobin: 15.9 g/dL (ref 13.0–17.0)
MCH: 33.9 pg (ref 26.0–34.0)
MCHC: 36.3 g/dL — ABNORMAL HIGH (ref 30.0–36.0)
MCV: 93.4 fL (ref 80.0–100.0)
Platelets: 256 10*3/uL (ref 150–400)
RBC: 4.69 MIL/uL (ref 4.22–5.81)
RDW: 11.8 % (ref 11.5–15.5)
WBC: 9.6 10*3/uL (ref 4.0–10.5)
nRBC: 0 % (ref 0.0–0.2)

## 2021-10-13 LAB — BASIC METABOLIC PANEL
Anion gap: 7 (ref 5–15)
BUN: 20 mg/dL (ref 6–20)
CO2: 23 mmol/L (ref 22–32)
Calcium: 9.2 mg/dL (ref 8.9–10.3)
Chloride: 104 mmol/L (ref 98–111)
Creatinine, Ser: 1 mg/dL (ref 0.61–1.24)
GFR, Estimated: >60 mL/min (ref >60)
Glucose, Bld: 100 mg/dL — ABNORMAL HIGH (ref 70–99)
Potassium: 3.4 mmol/L — ABNORMAL LOW (ref 3.5–5.1)
Sodium: 134 mmol/L — ABNORMAL LOW (ref 135–145)

## 2021-10-13 LAB — TROPONIN I (HIGH SENSITIVITY)
Troponin I (High Sensitivity): 5 ng/L (ref <18)
Troponin I (High Sensitivity): 5 ng/L (ref <18)

## 2021-10-13 MED ORDER — ACETAMINOPHEN 500 MG PO TABS
1000.0000 mg | ORAL_TABLET | Freq: Once | ORAL | Status: AC
  Administered 2021-10-13: 1000 mg via ORAL
  Filled 2021-10-13: qty 2

## 2021-10-13 NOTE — ED Triage Notes (Addendum)
Pt arrived via [facility] with c/o CP that has been off and on all day, pt is on Eliquis with hx of afib, pt states he is scheduled for TEE on 1/9.  Pt c/o feeling dizzy and lightheaded.  Pt had cardiac CT on Friday states he was told he may have a clot in L ventricle

## 2021-10-13 NOTE — ED Provider Notes (Signed)
[facility] Provider Note    Event Date/Time   First MD Initiated Contact with Patient 10/13/21 [phone]     (approximate)   History   Chest Pain   HPI  Patrick Short is a 46 y.o. male with past medical history of atrial fibrillation on Eliquis, hypertension, hyperlipidemia who presents with chest pain and lightheadedness.  Patient notes that for months he has had intermittent sweats associated with sharp left-sided chest pain.  He had similar today and has had this on and off.  Pain is somewhat pleuritic, it is not exertional.  There is some shortness of breath.  Has had this before.  No history of MI.  He is compliant with his Eliquis.  Patient also endorses some lightheadedness and feels generally "off".  Has trouble describing what exactly feels not right but says he feels somewhat shaky.  He denies dizziness, visual change, numbness weakness or tingling.  Does have a mild headache.  No fevers or chills.  I reviewed the recent CT chest imaging ordered by the patient's outpatient provider which was performed on 12/29 which shows a possible clot in the left atrial appendage.  He is scheduled to undergo a TEE in about 1 week for further evaluation of this.     Physical Exam  Triage Vital Signs: ED Triage Vitals  Enc Vitals Group     BP 10/13/21 0047 124/88     Pulse Rate 10/13/21 0047 60     Resp 10/13/21 0047 18     Temp 10/13/21 0047 97.8 F (36.6 C)     Temp Source 10/13/21 0047 Oral     SpO2 10/13/21 0047 96 %     Weight 10/13/21 0049 187 lb (84.8 kg)     Height 10/13/21 0049 5\' 5"  (1.651 m)     Head Circumference --      Peak Flow --      Pain Score 10/13/21 0048 5     Pain Loc --      Pain Edu? --      Excl. in GC? --     Most recent vital signs: Vitals:   10/13/21 0047  BP: 124/88  Pulse: 60  Resp: 18  Temp: 97.8 F (36.6 C)  SpO2: 96%     General: Awake, no distress.  CV:  Good peripheral perfusion.  Resp:  Normal effort.  Abd:  No  distention.  Neuro:             Awake, Alert, Oriented x 3; Aox3, nml speech  PERRL, EOMI, face symmetric, nml tongue movement  5/5 strength in the BL upper and lower extremities  Sensation grossly intact in the BL upper and lower extremities  Finger-nose-finger intact BL     ED Results / Procedures / Treatments  Labs (all labs ordered are listed, but only abnormal results are displayed) Labs Reviewed  BASIC METABOLIC PANEL - Abnormal; Notable for the following components:      Result Value   Sodium 134 (*)    Potassium 3.4 (*)    Glucose, Bld 100 (*)    All other components within normal limits  CBC - Abnormal; Notable for the following components:   MCHC 36.3 (*)    All other components within normal limits  TROPONIN I (HIGH SENSITIVITY)  TROPONIN I (HIGH SENSITIVITY)     EKG  EKG reviewed myself, normal sinus rhythm with PACs, normal axis, normal intervals, no acute ischemic changes   RADIOLOGY Chest  x-ray reviewed by myself, no acute findings, agree with radiology report  PROCEDURES:  Critical Care performed: No     MEDICATIONS ORDERED IN ED: Medications  acetaminophen (TYLENOL) tablet 1,000 mg (has no administration in time range)     IMPRESSION / MDM / ASSESSMENT AND PLAN / ED COURSE  I reviewed the triage vital signs and the nursing notes.                              Differential diagnosis includes, but is not limited to, ACS, pulmonary embolism, pleurisy, pneumonia, pneumothorax  The patient is a 46 year old male presenting with chest pain and lightheadedness/dizziness.  Has had this symptoms before.  His chest pain is atypical for acute coronary syndrome and that it is sharp, more pleuritic and nonexertional.  He has felt the pain several times in the waiting room but is currently pain-free.  Vital signs are within normal limits.  His EKG is nonischemic and his chest x-ray does not show any infiltrate or pneumonia.  Consider pulmonary embolism however  the patient is anticoagulated on Eliquis already and is PERC negative so did not think PE needs to be worked up any further.  With the possible clot in the left atrial appendage and the patient's dizziness I did consider diagnosis of CVA however the symptoms are intermittent described more as lightheadedness and he has no abnormal neurologic findings on neurologic exam so I do not feel that CVA is likely.  Patient's troponins are negative.  I feel that he is stable for outpatient follow-up for his TEE.  Return precautions discussed.  Clinical Course as of 10/13/21 0505  Wynelle Link Oct 13, 2021  0504 Troponin I (High Sensitivity): 5 [KM]    Clinical Course User Index [KM] Georga Hacking, MD     FINAL CLINICAL IMPRESSION(S) / ED DIAGNOSES   Final diagnoses:  Chest pain, unspecified type     Rx / DC Orders   ED Discharge Orders     None        Note:  This document was prepared using Dragon voice recognition software and may include unintentional dictation errors.   Georga Hacking, MD 10/13/21 [phone]

## 2021-10-13 NOTE — Discharge Instructions (Signed)
Your EKG and cardiac enzymes were reassuring.  Please continue to take your Eliquis and follow-up for your TEE.

## 2021-10-13 NOTE — ED Triage Notes (Signed)
FIRST NURSE NOTE:  Pt arrived via [facility] with c/o CP, hx of afib, c/o dizziness and lightheaded, pt reports CP started 30 mins ago and heart racing,  EKG - neg  20G L hand, no ASA given, pt has allergy to AsA.  134/95 99-100% RA

## 2021-10-14 ENCOUNTER — Telehealth: Payer: Self-pay

## 2021-10-14 NOTE — Telephone Encounter (Signed)
Transition Care Management Unsuccessful Follow-up Telephone Call  Date of discharge and from where:  10/13/2021-[facility]  Attempts:  1st Attempt  Reason for unsuccessful TCM follow-up call:  Left voice message

## 2021-10-15 NOTE — Telephone Encounter (Signed)
Transition Care Management Follow-up Telephone Call Date of discharge and from where: 10/13/2021 from [facility] How have you been since you were released from the hospital? Pt stated that he is feeling better. Pt did not have any questions or concerns.  Any questions or concerns? No  Items Reviewed: Did the pt receive and understand the discharge instructions provided? Yes  Medications obtained and verified? Yes  Other? No  Any new allergies since your discharge? No  Dietary orders reviewed? No Do you have support at home? Yes   Functional Questionnaire: (I = Independent and D = Dependent) ADLs: I  Bathing/Dressing- I  Meal Prep- I  Eating- I  Maintaining continence- I  Transferring/Ambulation- I  Managing Meds- I   Follow up appointments reviewed: PCP Hospital f/u appt confirmed? No  No upcoming with PCP, I did encourage pt to reach out to office.  Specialist Hospital f/u appt confirmed? No  Pt has a cathlab appt for ablation on 10/21/2021 Are transportation arrangements needed? No  If their condition worsens, is the pt aware to call PCP or go to the Emergency Dept.? Yes Was the patient provided with contact information for the PCP's office or ED? Yes Was to pt encouraged to call back with questions or concerns? Yes

## 2021-10-17 NOTE — Progress Notes (Signed)
BP (!) 86/56    Pulse 60    Temp 98.7 F (37.1 C) (Oral)    Wt 172 lb 12.8 oz (78.4 kg)    SpO2 96%    BMI 28.76 kg/m    Subjective:    Patient ID: Patrick Short, male    DOB: 08/22/1976, 46 y.o.   MRN: [id number]  HPI: Patrick Short is a 47 y.o. male  Chief Complaint  Patient presents with   ER Follow Up    Pt states he was seen at the [facility] on New Years, states he had low potassium and magnesium    Patient was seen in the ER on New Years due to symptoms of chest pain and chest tightness.  It was found that he has low potassium and calcium.    Patient feels like his anxiety/Depression has improved but feels like she could have something to help him calm down.  He is happy to increase his Zoloft to see if that helps.     Relevant past medical, surgical, family and social history reviewed and updated as indicated. Interim medical history since our last visit reviewed. Allergies and medications reviewed and updated.  Review of Systems  Eyes:  Negative for visual disturbance.  Respiratory:  Negative for shortness of breath.   Cardiovascular:  Negative for chest pain and leg swelling.  Neurological:  Negative for light-headedness and headaches.  Psychiatric/Behavioral:  Positive for dysphoric mood. The patient is nervous/anxious.    Per HPI unless specifically indicated above     Objective:    BP (!) 86/56    Pulse 60    Temp 98.7 F (37.1 C) (Oral)    Wt 172 lb 12.8 oz (78.4 kg)    SpO2 96%    BMI 28.76 kg/m   Wt Readings from Last 3 Encounters:  10/18/21 172 lb 12.8 oz (78.4 kg)  10/13/21 187 lb (84.8 kg)  09/23/21 187 lb (84.8 kg)    Physical Exam Vitals and nursing note reviewed.  Constitutional:      General: He is not in acute distress.    Appearance: Normal appearance. He is not ill-appearing, toxic-appearing or diaphoretic.  HENT:     Head: Normocephalic.     Right Ear: External ear normal.     Left Ear: External ear normal.     Nose: Nose normal. No congestion  or rhinorrhea.     Mouth/Throat:     Mouth: Mucous membranes are moist.  Eyes:     General:        Right eye: No discharge.        Left eye: No discharge.     Extraocular Movements: Extraocular movements intact.     Conjunctiva/sclera: Conjunctivae normal.     Pupils: Pupils are equal, round, and reactive to light.  Cardiovascular:     Rate and Rhythm: Normal rate and regular rhythm.     Heart sounds: No murmur heard. Pulmonary:     Effort: Pulmonary effort is normal. No respiratory distress.     Breath sounds: Normal breath sounds. No wheezing, rhonchi or rales.  Abdominal:     General: Abdomen is flat. Bowel sounds are normal.  Musculoskeletal:     Cervical back: Normal range of motion and neck supple.  Skin:    General: Skin is warm and dry.     Capillary Refill: Capillary refill takes less than 2 seconds.  Neurological:     General: No focal deficit present.  Mental Status: He is alert and oriented to person, place, and time.  Psychiatric:        Mood and Affect: Mood normal.        Behavior: Behavior normal.        Thought Content: Thought content normal.        Judgment: Judgment normal.    Results for orders placed or performed during the hospital encounter of 95/28/41  Basic metabolic panel  Result Value Ref Range   Sodium 134 (L) 135 - 145 mmol/L   Potassium 3.4 (L) 3.5 - 5.1 mmol/L   Chloride 104 98 - 111 mmol/L   CO2 23 22 - 32 mmol/L   Glucose, Bld 100 (H) 70 - 99 mg/dL   BUN 20 6 - 20 mg/dL   Creatinine, Ser 1.00 0.61 - 1.24 mg/dL   Calcium 9.2 8.9 - 10.3 mg/dL   GFR, Estimated >60 >60 mL/min   Anion gap 7 5 - 15  CBC  Result Value Ref Range   WBC 9.6 4.0 - 10.5 K/uL   RBC 4.69 4.22 - 5.81 MIL/uL   Hemoglobin 15.9 13.0 - 17.0 g/dL   HCT 43.8 39.0 - 52.0 %   MCV 93.4 80.0 - 100.0 fL   MCH 33.9 26.0 - 34.0 pg   MCHC 36.3 (H) 30.0 - 36.0 g/dL   RDW 11.8 11.5 - 15.5 %   Platelets 256 150 - 400 K/uL   nRBC 0.0 0.0 - 0.2 %  Troponin I (High  Sensitivity)  Result Value Ref Range   Troponin I (High Sensitivity) 5 <18 ng/L  Troponin I (High Sensitivity)  Result Value Ref Range   Troponin I (High Sensitivity) 5 <18 ng/L      Assessment & Plan:   Problem List Items Addressed This Visit       Other   Anxiety    Chronic.  Ongoing.  Continue with current medication regimen of Wellbutrin 329m.  Will increase Zoloft to 517mdaily.  Return to clinic in 1 months for reevaluation.  Call sooner if concerns arise.        Relevant Medications   sertraline (ZOLOFT) 50 MG tablet   Other Visit Diagnoses     Hypocalcemia    -  Primary   Labs ordered today. Will make recommendations based on lab results.   Relevant Orders   Comp Met (CMET)   Magnesium   Hypokalemia       Labs ordered today. Will make recommendations based on lab results.   Relevant Orders   Comp Met (CMET)   Magnesium        Follow up plan: Return in about 1 month (around 11/18/2021) for Depression/Anxiety FU.

## 2021-10-18 ENCOUNTER — Ambulatory Visit (INDEPENDENT_AMBULATORY_CARE_PROVIDER_SITE_OTHER): Payer: Medicaid Other | Admitting: Nurse Practitioner

## 2021-10-18 ENCOUNTER — Encounter: Payer: Self-pay | Admitting: Nurse Practitioner

## 2021-10-18 ENCOUNTER — Other Ambulatory Visit: Payer: Self-pay

## 2021-10-18 DIAGNOSIS — E876 Hypokalemia: Secondary | ICD-10-CM | POA: Diagnosis not present

## 2021-10-18 DIAGNOSIS — F419 Anxiety disorder, unspecified: Secondary | ICD-10-CM | POA: Diagnosis not present

## 2021-10-18 MED ORDER — SERTRALINE HCL 50 MG PO TABS: 50.0000 mg | ORAL_TABLET | Freq: Every day | ORAL | 1 refills | Status: DC

## 2021-10-18 NOTE — Assessment & Plan Note (Signed)
Chronic.  Ongoing.  Continue with current medication regimen of Wellbutrin 300mg .  Will increase Zoloft to 50mg  daily.  Return to clinic in 1 months for reevaluation.  Call sooner if concerns arise.

## 2021-10-18 NOTE — Pre-Procedure Instructions (Signed)
Instructed patient on the following items: Arrival time 0530 Nothing to eat or drink after midnight No meds AM of procedure Responsible person to drive you home and stay with you for 24 hrs  Have you missed any doses of anti-coagulant Eliquis- hasn't missed any     

## 2021-10-19 ENCOUNTER — Encounter: Payer: Self-pay | Admitting: Nurse Practitioner

## 2021-10-19 LAB — COMPREHENSIVE METABOLIC PANEL
ALT: 28 IU/L (ref 0–44)
AST: 27 IU/L (ref 0–40)
Albumin/Globulin Ratio: 1.8 (ref 1.2–2.2)
Albumin: 4.6 g/dL (ref 4.0–5.0)
Alkaline Phosphatase: 67 IU/L (ref 44–121)
BUN/Creatinine Ratio: 16 (ref 9–20)
BUN: 18 mg/dL (ref 6–24)
Bilirubin Total: 0.5 mg/dL (ref 0.0–1.2)
CO2: 23 mmol/L (ref 20–29)
Calcium: 9.6 mg/dL (ref 8.7–10.2)
Chloride: 102 mmol/L (ref 96–106)
Creatinine, Ser: 1.16 mg/dL (ref 0.76–1.27)
Globulin, Total: 2.5 g/dL (ref 1.5–4.5)
Glucose: 89 mg/dL (ref 70–99)
Potassium: 3.9 mmol/L (ref 3.5–5.2)
Sodium: 137 mmol/L (ref 134–144)
Total Protein: 7.1 g/dL (ref 6.0–8.5)
eGFR: 79 mL/min/{1.73_m2} (ref >59)

## 2021-10-19 LAB — MAGNESIUM: Magnesium: 2.6 mg/dL — ABNORMAL HIGH (ref 1.6–2.3)

## 2021-10-20 NOTE — Anesthesia Preprocedure Evaluation (Addendum)
Anesthesia Evaluation  Patient identified by MRN, date of birth, ID band Patient awake    Reviewed: Allergy & Precautions, NPO status , Patient's Chart, lab work & pertinent test results, reviewed documented beta blocker date and time   Airway Mallampati: II  TM Distance: >3 FB Neck ROM: Full    Dental  (+) Teeth Intact, Dental Advisory Given   Pulmonary former smoker,    Pulmonary exam normal breath sounds clear to auscultation       Cardiovascular hypertension, Pt. on home beta blockers and Pt. on medications (-) angina+ dysrhythmias Atrial Fibrillation  Rhythm:Irregular Rate:Abnormal     Neuro/Psych PSYCHIATRIC DISORDERS Anxiety Depression negative neurological ROS     GI/Hepatic negative GI ROS, Neg liver ROS,   Endo/Other  negative endocrine ROS  Renal/GU negative Renal ROS     Musculoskeletal negative musculoskeletal ROS (+)   Abdominal   Peds  Hematology  (+) Blood dyscrasia (Eliquis), ,   Anesthesia Other Findings   Reproductive/Obstetrics                            Anesthesia Physical Anesthesia Plan  ASA: 3  Anesthesia Plan: General   Post-op Pain Management: Tylenol PO (pre-op)   Induction: Intravenous  PONV Risk Score and Plan: 2 and Midazolam, Dexamethasone and Ondansetron  Airway Management Planned: Oral ETT  Additional Equipment:   Intra-op Plan:   Post-operative Plan: Extubation in OR  Informed Consent: I have reviewed the patients History and Physical, chart, labs and discussed the procedure including the risks, benefits and alternatives for the proposed anesthesia with the patient or authorized representative who has indicated his/her understanding and acceptance.     Dental advisory given  Plan Discussed with: CRNA  Anesthesia Plan Comments:        Anesthesia Quick Evaluation

## 2021-10-21 ENCOUNTER — Encounter (HOSPITAL_COMMUNITY)
Admission: RE | Disposition: A | Discharge status: Home / Self Care | Payer: Medicaid Other | Source: Ambulatory Visit | Attending: Cardiology

## 2021-10-21 ENCOUNTER — Ambulatory Visit (HOSPITAL_COMMUNITY)
Admission: RE | Admit: 2021-10-21 | Discharge: 2021-10-21 | Disposition: A | Discharge status: Home / Self Care | Payer: Medicaid Other | Source: Ambulatory Visit | Attending: Cardiology | Admitting: Cardiology

## 2021-10-21 ENCOUNTER — Other Ambulatory Visit: Payer: Self-pay

## 2021-10-21 ENCOUNTER — Ambulatory Visit (HOSPITAL_COMMUNITY): Payer: Medicaid Other | Admitting: Anesthesiology

## 2021-10-21 ENCOUNTER — Ambulatory Visit (HOSPITAL_BASED_OUTPATIENT_CLINIC_OR_DEPARTMENT_OTHER): Payer: Medicaid Other

## 2021-10-21 DIAGNOSIS — Z7901 Long term (current) use of anticoagulants: Secondary | ICD-10-CM | POA: Insufficient documentation

## 2021-10-21 DIAGNOSIS — I483 Typical atrial flutter: Secondary | ICD-10-CM | POA: Diagnosis not present

## 2021-10-21 DIAGNOSIS — I4891 Unspecified atrial fibrillation: Secondary | ICD-10-CM | POA: Diagnosis not present

## 2021-10-21 DIAGNOSIS — F418 Other specified anxiety disorders: Secondary | ICD-10-CM | POA: Diagnosis not present

## 2021-10-21 DIAGNOSIS — I081 Rheumatic disorders of both mitral and tricuspid valves: Secondary | ICD-10-CM | POA: Diagnosis not present

## 2021-10-21 DIAGNOSIS — Q2112 Patent foramen ovale: Secondary | ICD-10-CM | POA: Diagnosis not present

## 2021-10-21 DIAGNOSIS — I480 Paroxysmal atrial fibrillation: Secondary | ICD-10-CM | POA: Diagnosis not present

## 2021-10-21 DIAGNOSIS — I10 Essential (primary) hypertension: Secondary | ICD-10-CM | POA: Diagnosis not present

## 2021-10-21 HISTORY — PX: TEE WITHOUT CARDIOVERSION: SHX5443

## 2021-10-21 HISTORY — PX: ATRIAL FIBRILLATION ABLATION: EP1191

## 2021-10-21 LAB — POCT ACTIVATED CLOTTING TIME
Activated Clotting Time: 323 seconds
Activated Clotting Time: 341 seconds
Activated Clotting Time: 353 seconds

## 2021-10-21 SURGERY — ATRIAL FIBRILLATION ABLATION
Anesthesia: General

## 2021-10-21 MED ORDER — PROTAMINE SULFATE 10 MG/ML IV SOLN
INTRAVENOUS | Status: DC | PRN
  Administered 2021-10-21: 35 mg via INTRAVENOUS

## 2021-10-21 MED ORDER — ISOPROTERENOL HCL 0.2 MG/ML IJ SOLN
INTRAMUSCULAR | Status: AC
  Filled 2021-10-21: qty 5

## 2021-10-21 MED ORDER — METOPROLOL TARTRATE 25 MG PO TABS
25.0000 mg | ORAL_TABLET | Freq: Two times a day (BID) | ORAL | Status: DC
  Administered 2021-10-21: 25 mg via ORAL
  Filled 2021-10-21 (×2): qty 1

## 2021-10-21 MED ORDER — DEXAMETHASONE SODIUM PHOSPHATE 10 MG/ML IJ SOLN
INTRAMUSCULAR | Status: DC | PRN
  Administered 2021-10-21: 10 mg via INTRAVENOUS

## 2021-10-21 MED ORDER — ROCURONIUM BROMIDE 10 MG/ML (PF) SYRINGE
PREFILLED_SYRINGE | INTRAVENOUS | Status: DC | PRN
  Administered 2021-10-21: 60 mg via INTRAVENOUS

## 2021-10-21 MED ORDER — SODIUM CHLORIDE 0.9% FLUSH: 3.0000 mL | INTRAVENOUS | Status: DC | PRN

## 2021-10-21 MED ORDER — COLCHICINE 0.6 MG PO TABS: 0.6000 mg | ORAL_TABLET | Freq: Two times a day (BID) | ORAL | 0 refills | Status: DC

## 2021-10-21 MED ORDER — SODIUM CHLORIDE 0.9 % IV SOLN: 250.0000 mL | INTRAVENOUS | Status: DC | PRN

## 2021-10-21 MED ORDER — ACETAMINOPHEN 325 MG PO TABS
650.0000 mg | ORAL_TABLET | ORAL | Status: DC | PRN
  Filled 2021-10-21: qty 2

## 2021-10-21 MED ORDER — LIDOCAINE 2% (20 MG/ML) 5 ML SYRINGE
INTRAMUSCULAR | Status: DC | PRN
Start: 2021-10-21 — End: 2021-10-21
  Administered 2021-10-21: 80 mg via INTRAVENOUS

## 2021-10-21 MED ORDER — COLCHICINE 0.6 MG PO TABS
0.6000 mg | ORAL_TABLET | Freq: Two times a day (BID) | ORAL | Status: DC
  Administered 2021-10-21: 0.6 mg via ORAL
  Filled 2021-10-21: qty 1

## 2021-10-21 MED ORDER — PANTOPRAZOLE SODIUM 40 MG PO TBEC
40.0000 mg | DELAYED_RELEASE_TABLET | Freq: Every day | ORAL | Status: DC
  Administered 2021-10-21: 40 mg via ORAL
  Filled 2021-10-21: qty 1

## 2021-10-21 MED ORDER — HEPARIN (PORCINE) IN NACL 1000-0.9 UT/500ML-% IV SOLN
INTRAVENOUS | Status: AC
  Filled 2021-10-21: qty 500

## 2021-10-21 MED ORDER — LACTATED RINGERS IV SOLN: INTRAVENOUS | Status: DC | PRN

## 2021-10-21 MED ORDER — ONDANSETRON HCL 4 MG/2ML IJ SOLN
INTRAMUSCULAR | Status: DC | PRN
  Administered 2021-10-21: 4 mg via INTRAVENOUS

## 2021-10-21 MED ORDER — PHENYLEPHRINE HCL-NACL 20-0.9 MG/250ML-% IV SOLN
INTRAVENOUS | Status: DC | PRN
  Administered 2021-10-21: 25 ug/min via INTRAVENOUS

## 2021-10-21 MED ORDER — APIXABAN 5 MG PO TABS
5.0000 mg | ORAL_TABLET | Freq: Two times a day (BID) | ORAL | Status: DC
  Administered 2021-10-21: 5 mg via ORAL
  Filled 2021-10-21: qty 1

## 2021-10-21 MED ORDER — ISOPROTERENOL HCL 0.2 MG/ML IJ SOLN
INTRAMUSCULAR | Status: DC | PRN
  Administered 2021-10-21: 4 ug/min via INTRAVENOUS

## 2021-10-21 MED ORDER — HEPARIN SODIUM (PORCINE) 1000 UNIT/ML IJ SOLN
INTRAMUSCULAR | Status: AC
  Filled 2021-10-21: qty 10

## 2021-10-21 MED ORDER — MIDAZOLAM HCL 2 MG/2ML IJ SOLN
INTRAMUSCULAR | Status: DC | PRN
  Administered 2021-10-21: 2 mg via INTRAVENOUS

## 2021-10-21 MED ORDER — PROPOFOL 10 MG/ML IV BOLUS
INTRAVENOUS | Status: DC | PRN
  Administered 2021-10-21: 40 mg via INTRAVENOUS
  Administered 2021-10-21: 140 mg via INTRAVENOUS

## 2021-10-21 MED ORDER — SODIUM CHLORIDE 0.9 % IV SOLN: INTRAVENOUS | Status: DC

## 2021-10-21 MED ORDER — HEPARIN SODIUM (PORCINE) 1000 UNIT/ML IJ SOLN
INTRAMUSCULAR | Status: DC | PRN
  Administered 2021-10-21: 3000 [IU] via INTRAVENOUS
  Administered 2021-10-21: 2000 [IU] via INTRAVENOUS
  Administered 2021-10-21: 13000 [IU] via INTRAVENOUS

## 2021-10-21 MED ORDER — SODIUM CHLORIDE 0.9% FLUSH: 3.0000 mL | Freq: Two times a day (BID) | INTRAVENOUS | Status: DC

## 2021-10-21 MED ORDER — FENTANYL CITRATE (PF) 250 MCG/5ML IJ SOLN
INTRAMUSCULAR | Status: DC | PRN
  Administered 2021-10-21 (×2): 50 ug via INTRAVENOUS

## 2021-10-21 MED ORDER — ACETAMINOPHEN 500 MG PO TABS
1000.0000 mg | ORAL_TABLET | Freq: Once | ORAL | Status: AC
Start: 2021-10-21 — End: 2021-10-21
  Administered 2021-10-21: 1000 mg via ORAL
  Filled 2021-10-21: qty 2

## 2021-10-21 MED ORDER — EPHEDRINE SULFATE-NACL 50-0.9 MG/10ML-% IV SOSY
PREFILLED_SYRINGE | INTRAVENOUS | Status: DC | PRN
  Administered 2021-10-21: 5 mg via INTRAVENOUS

## 2021-10-21 MED ORDER — HEPARIN (PORCINE) IN NACL 1000-0.9 UT/500ML-% IV SOLN
INTRAVENOUS | Status: DC | PRN
  Administered 2021-10-21 (×4): 500 mL

## 2021-10-21 MED ORDER — PANTOPRAZOLE SODIUM 40 MG PO TBEC: 40.0000 mg | DELAYED_RELEASE_TABLET | Freq: Every day | ORAL | 0 refills | Status: DC

## 2021-10-21 MED ORDER — HEPARIN SODIUM (PORCINE) 1000 UNIT/ML IJ SOLN
INTRAMUSCULAR | Status: DC | PRN
  Administered 2021-10-21: 1000 [IU] via INTRAVENOUS

## 2021-10-21 MED ORDER — ONDANSETRON HCL 4 MG/2ML IJ SOLN: 4.0000 mg | Freq: Four times a day (QID) | INTRAMUSCULAR | Status: DC | PRN

## 2021-10-21 MED ORDER — SUGAMMADEX SODIUM 200 MG/2ML IV SOLN
INTRAVENOUS | Status: DC | PRN
  Administered 2021-10-21: 200 mg via INTRAVENOUS

## 2021-10-21 SURGICAL SUPPLY — 17 items
CATH OCTARAY 2.0 F 3-3-3-3-3 (CATHETERS) ×2 IMPLANT
CATH S CIRCA THERM PROBE 10F (CATHETERS) ×2 IMPLANT
CATH SMTCH THERMOCOOL SF DF (CATHETERS) ×2 IMPLANT
CATH SOUNDSTAR ECO 8FR (CATHETERS) ×2 IMPLANT
CATH WEBSTER BI DIR CS D-F CRV (CATHETERS) ×2 IMPLANT
CLOSURE PERCLOSE PROSTYLE (VASCULAR PRODUCTS) ×6 IMPLANT
COVER SWIFTLINK CONNECTOR (BAG) ×3 IMPLANT
PACK EP LATEX FREE (CUSTOM PROCEDURE TRAY) ×3
PACK EP LF (CUSTOM PROCEDURE TRAY) ×1 IMPLANT
PAD DEFIB RADIO PHYSIO CONN (PAD) ×3 IMPLANT
PATCH CARTO3 (PAD) ×2 IMPLANT
SHEATH BAYLIS TRANSSEPTAL 98CM (NEEDLE) ×2 IMPLANT
SHEATH CARTO VIZIGO SM CVD (SHEATH) ×2 IMPLANT
SHEATH PINNACLE 8F 10CM (SHEATH) ×4 IMPLANT
SHEATH PINNACLE 9F 10CM (SHEATH) ×2 IMPLANT
SHEATH PROBE COVER 6X72 (BAG) ×2 IMPLANT
TUBING SMART ABLATE COOLFLOW (TUBING) ×2 IMPLANT

## 2021-10-21 NOTE — Progress Notes (Signed)
HI [employer].  Your lab work looks good.  All your electrolytes are within normal limits or slightly elevated. I do not think we need to supplement at this time. We will continue to monitor after your procedure.

## 2021-10-21 NOTE — Anesthesia Postprocedure Evaluation (Signed)
Anesthesia Post Note  Patient: Patrick Short  Procedure(s) Performed: ATRIAL FIBRILLATION ABLATION TRANSESOPHAGEAL ECHOCARDIOGRAM (TEE)     Patient location during evaluation: Cath Lab Anesthesia Type: General Level of consciousness: awake and alert Pain management: pain level controlled Vital Signs Assessment: post-procedure vital signs reviewed and stable Respiratory status: spontaneous breathing, nonlabored ventilation and respiratory function stable Cardiovascular status: blood pressure returned to baseline and stable Postop Assessment: no apparent nausea or vomiting Anesthetic complications: no   No notable events documented.  Last Vitals:  Vitals:   10/21/21 1330 10/21/21 1345  BP: 112/77 111/63  Pulse: 66 76  Resp: (!) 21 16  Temp:    SpO2: 99% 99%    Last Pain:  Vitals:   10/21/21 1220  TempSrc:   PainSc: 0-No pain                 Cecile Hearing

## 2021-10-21 NOTE — H&P (Signed)
Electrophysiology Office Note:     Date:  10/21/2021    ID:  Patrick Short, DOB 18-Jul-1976, MRN [id number]   PCP:  Jon Billings, NP        [facility] Cardiologist:  Nelva Bush, MD  [facility] Electrophysiologist:  Vickie Epley, MD    Referring MD: Nelva Bush, MD    Chief Complaint: Atrial fibrillation   History of Present Illness:     Patrick Short is a 46 y.o. male who presents for an evaluation of atrial fibrillation at the request of Dr. Saunders Revel. Their medical history includes hypertension.  The patient last saw Cadence Furth on May 19, 2021.  He presented to the emergency department June 18, 2021 with palpitations.  In the ER he was noted to be in sinus rhythm.  He was subsequently seen on July 03, 2021 and was found to be in atrial fibrillation.  Anticoagulation was started and an echo was ordered.  ZIO monitor showed a 26% burden of atrial fibrillation and flutter.  He did have RVR when he was in atrial fibrillation.  He tells me he is quite symptomatic when he is in atrial fibrillation with palpitations and lightheadedness/dizziness.    10/21/2021. Doing well. Plan for TEE + PVI + CTI today.   Objective        Past Medical History:  Diagnosis Date   Anxiety     Depression     Hypertension     Mineral deficiency     PTSD (post-traumatic stress disorder)             Past Surgical History:  Procedure Laterality Date   APPENDECTOMY       EYE SURGERY        laceration to pupil   FRACTURE SURGERY Left      Rod- which was replaced due to rejection of original rod   SPINAL FUSION       SPLENECTOMY, TOTAL          Current Medications: Active Medications      Current Meds  Medication Sig   apixaban (ELIQUIS) 5 MG TABS tablet Take 1 tablet (5 mg total) by mouth 2 (two) times daily.   buPROPion (WELLBUTRIN XL) 300 MG 24 hr tablet Take 1 tablet (300 mg total) by mouth daily.   EPINEPHrine (EPIPEN 2-PAK) 0.3 mg/0.3 mL IJ SOAJ injection Inject  0.3 mg into the muscle as needed for anaphylaxis.   metoprolol tartrate (LOPRESSOR) 25 MG tablet Take 1 tablet (25 mg total) by mouth 2 (two) times daily.   rOPINIRole (REQUIP) 1 MG tablet Take 1 tablet (1 mg total) by mouth at bedtime.   sertraline (ZOLOFT) 25 MG tablet Take 1 tablet (25 mg total) by mouth daily.   valsartan-hydrochlorothiazide (DIOVAN-HCT) 160-12.5 MG tablet Take 1 tablet by mouth daily.        Allergies:   Aspirin, Bee venom, Sulfa antibiotics, and Penicillins    Social History         Socioeconomic History   Marital status: Single      Spouse name: Not on file   Number of children: Not on file   Years of education: Not on file   Highest education level: Not on file  Occupational History   Not on file  Tobacco Use   Smoking status: Former      Packs/day: 1.00      Years: 25.00      Pack years: 25.00      Types:  Cigarettes   Smokeless tobacco: Never  Vaping Use   Vaping Use: Never used  Substance and Sexual Activity   Alcohol use: Not Currently   Drug use: Yes      Frequency: 7.0 times per week      Types: Marijuana   Sexual activity: Yes  Other Topics Concern   Not on file  Social History Narrative   Not on file    Social Determinants of Health    Financial Resource Strain: Not on file  Food Insecurity: Not on file  Transportation Needs: Not on file  Physical Activity: Not on file  Stress: Not on file  Social Connections: Not on file      Family History: The patient's family history includes Atrial fibrillation in his mother; Coronary artery disease in his paternal grandfather; Heart disease in his maternal grandmother; Hypertension in his father, maternal grandmother, mother, and paternal grandfather; Other in his son.   ROS:   Please see the history of present illness.    All other systems reviewed and are negative.   EKGs/Labs/Other Studies Reviewed:     The following studies were reviewed today:   July 15, 2021 ZIO monitor  personally reviewed 26% burden of atrial fibrillation Patient triggered episodes corresponded to atrial fibrillation, PACs, PVCs   July 19, 2021 EKG shows no preexcitation, sinus rhythm  July 02, 2021 EKG shows coarse atrial fibrillation/flutter    EKG:  The ekg ordered today demonstrates sinus rhythm with PACs.  One of the PACs has a right bundle aberrancy     Recent Labs: 06/21/2021: ALT 25 07/02/2021: BUN 11; Creatinine, Ser 1.06; Hemoglobin 15.4; Platelets 254; Potassium 4.3; Sodium 140; TSH 0.593  Recent Lipid Panel Labs (Brief)          Component Value Date/Time    CHOL 198 02/18/2021 1033    TRIG 133 02/18/2021 1033    HDL 35 (L) 02/18/2021 1033    CHOLHDL 5.7 (H) 02/18/2021 1033    LDLCALC 139 (H) 02/18/2021 1033        Physical Exam:     VS:  BP 94/68 (BP Location: Left Arm, Patient Position: Sitting, Cuff Size: Normal)    Pulse (!) 59    Ht 5\' 6"  (1.676 m)    Wt 169 lb (76.7 kg)    SpO2 98%    BMI 27.28 kg/m         Wt Readings from Last 3 Encounters:  07/31/21 169 lb (76.7 kg)  07/19/21 172 lb 8 oz (78.2 kg)  07/11/21 170 lb 3.2 oz (77.2 kg)      GEN:  Well nourished, well developed in no acute distress HEENT: Normal NECK: No JVD; No carotid bruits LYMPHATICS: No lymphadenopathy CARDIAC: RRR, no murmurs, rubs, gallops RESPIRATORY:  Clear to auscultation without rales, wheezing or rhonchi  ABDOMEN: Soft, non-tender, non-distended MUSCULOSKELETAL:  No edema; No deformity  SKIN: Warm and dry NEUROLOGIC:  Alert and oriented x 3 PSYCHIATRIC:  Normal affect          Assessment     ASSESSMENT:     1. PAF (paroxysmal atrial fibrillation) ([place])   2. Atrial flutter, unspecified type ([place])   3. Essential hypertension     PLAN:     In order of problems listed above:   #Paroxysmal atrial fibrillation and flutter Symptomatic.  Rhythm control is indicated.  I discussed management options including antiarrhythmic drug therapy and catheter ablation.   We discussed the efficacy between the 2 options and  risks.  He would like to pursue catheter ablation which I think is very reasonable.  I discussed a catheter ablation procedure in detail including the risk, recovery and expected efficacy.  He would like to proceed with scheduling.  He will need a CT scan prior to the ablation procedure.  He will continue taking his Eliquis prior to the ablation.   Risk, benefits, and alternatives to EP study and radiofrequency ablation for afib were also discussed in detail today. These risks include but are not limited to stroke, bleeding, vascular damage, tamponade, perforation, damage to the esophagus, lungs, and other structures, pulmonary vein stenosis, worsening renal function, and death. The patient understands these risk and wishes to proceed.  We will therefore proceed with catheter ablation at the next available time.  Carto, ICE, anesthesia are requested for the procedure.  Will also obtain CT PV protocol prior to the procedure to exclude LAA thrombus and further evaluate atrial anatomy.    -------------------------------------  I have seen, examined the patient, and reviewed the above assessment and plan.    Plan for TEE + PVI + CTI today. Procedures reviewed.   Vickie Epley, MD 10/21/2021 7:05 AM

## 2021-10-21 NOTE — Anesthesia Procedure Notes (Signed)
Procedure Name: Intubation Date/Time: 10/21/2021 7:41 AM Performed by: Gaylene Brooks, CRNA Pre-anesthesia Checklist: Patient identified, Emergency Drugs available, Suction available and Patient being monitored Patient Re-evaluated:Patient Re-evaluated prior to induction Oxygen Delivery Method: Circle System Utilized Preoxygenation: Pre-oxygenation with 100% oxygen Induction Type: IV induction Ventilation: Mask ventilation without difficulty Laryngoscope Size: Miller and 2 Grade View: Grade I Tube type: Oral Tube size: 7.5 mm Number of attempts: 1 Airway Equipment and Method: Stylet and Oral airway Placement Confirmation: ETT inserted through vocal cords under direct vision, positive ETCO2 and breath sounds checked- equal and bilateral Secured at: 22 cm Tube secured with: Tape Dental Injury: Teeth and Oropharynx as per pre-operative assessment

## 2021-10-21 NOTE — CV Procedure (Signed)
° ° °  TRANSESOPHAGEAL ECHOCARDIOGRAM   NAME:  Patrick Short   MRN: [id number] DOB:  [birth year]   ADMIT DATE: 10/21/2021  INDICATIONS:  Atrial fibrillation (pre-ablation)  PROCEDURE:   Informed consent was obtained prior to the procedure. The risks, benefits and alternatives for the procedure were discussed and the patient comprehended these risks.  Risks include, but are not limited to, cough, sore throat, vomiting, nausea, somnolence, esophageal and stomach trauma or perforation, bleeding, low blood pressure, aspiration, pneumonia, infection, trauma to the teeth and death.    The patient was already intubated and sedated by the anesthesia service. After a procedural time-out, the transesophageal probe was inserted in the esophagus and stomach without difficulty and multiple views were obtained.    COMPLICATIONS:    There were no immediate complications.  FINDINGS:  LEFT VENTRICLE: EF = 40-45%. Global HK  RIGHT VENTRICLE: Normal size and function.   LEFT ATRIUM: Normal  LEFT ATRIAL APPENDAGE: Small, multi-lobed. No thrombus.   RIGHT ATRIUM: Normal  AORTIC VALVE:  Trileaflet.  No AI/AS.  MITRAL VALVE:    Normal. Trivial MR  TRICUSPID VALVE: Normal. Trivial TR  PULMONIC VALVE: Grossly normal. Trivial PR  INTERATRIAL SEPTUM: Aneurysmal. Small PFO with L -> R shunting  PERICARDIUM: No effusion  DESCENDING AORTA: No significant plaque.    Benay Spice 8:32 AM

## 2021-10-21 NOTE — Interval H&P Note (Signed)
History and Physical Interval Note:  10/21/2021 8:11 AM  Patrick Short  has presented today for surgery, with the diagnosis of afib.  The various methods of treatment have been discussed with the patient and family. After consideration of risks, benefits and other options for treatment, the patient has consented to  Transesophageal echocardiogram (TEE) as a surgical intervention.  The patient's history has been reviewed, patient examined, no change in status, stable for surgery.  I have reviewed the patient's chart and labs.  Questions were answered to the patient's satisfaction.     Kaleeah Gingerich

## 2021-10-21 NOTE — Transfer of Care (Signed)
Immediate Anesthesia Transfer of Care Note  Patient: Patrick Short  Procedure(s) Performed: ATRIAL FIBRILLATION ABLATION TRANSESOPHAGEAL ECHOCARDIOGRAM (TEE)  Patient Location: Cath Lab  Anesthesia Type:General  Level of Consciousness: awake, alert  and oriented  Airway & Oxygen Therapy: Patient Spontanous Breathing  Post-op Assessment: Report given to RN, Post -op Vital signs reviewed and stable and Patient moving all extremities X 4  Post vital signs: Reviewed and stable  Last Vitals:  Vitals Value Taken Time  BP 116/66 10/21/21 1118  Temp 36.3 C 10/21/21 1117  Pulse 85 10/21/21 1120  Resp 20 10/21/21 1120  SpO2 96 % 10/21/21 1120  Vitals shown include unvalidated device data.  Last Pain:  Vitals:   10/21/21 1117  TempSrc: Temporal  PainSc: 0-No pain         Complications: No notable events documented.

## 2021-10-22 ENCOUNTER — Encounter (HOSPITAL_COMMUNITY): Payer: Self-pay | Admitting: Cardiology

## 2021-10-25 ENCOUNTER — Encounter: Payer: Self-pay | Admitting: Internal Medicine

## 2021-10-25 ENCOUNTER — Ambulatory Visit (INDEPENDENT_AMBULATORY_CARE_PROVIDER_SITE_OTHER): Payer: Medicaid Other | Admitting: Internal Medicine

## 2021-10-25 ENCOUNTER — Other Ambulatory Visit: Payer: Self-pay

## 2021-10-25 VITALS — BP 110/80 | HR 94 | Ht 66.0 in | Wt 173.0 lb

## 2021-10-25 DIAGNOSIS — I10 Essential (primary) hypertension: Secondary | ICD-10-CM

## 2021-10-25 DIAGNOSIS — I429 Cardiomyopathy, unspecified: Secondary | ICD-10-CM | POA: Diagnosis not present

## 2021-10-25 DIAGNOSIS — I480 Paroxysmal atrial fibrillation: Secondary | ICD-10-CM

## 2021-10-25 NOTE — Progress Notes (Signed)
Follow-up Outpatient Visit Date: 10/25/2021  Primary Care Provider: Jon Billings, NP [address] [place] [address]  Chief Complaint: Follow-up atrial fibrillation and cardiomyopathy  HPI:  Mr. Tindel is a 46 y.o. male with history of paroxysmal atrial fibrillation, hypertension, anxiety, depression, and PTSD, who presents for follow-up of fibrillation.  I met him in September following ED visit for palpitations, lightheadedness, and palpitations.  He was in sinus rhythm at the time of his ED evaluation but was subsequently noted to be in atrial fibrillation when he saw his PCP.  He was referred to Dr. Quentin Ore for further management and underwent atrial fibrillation ablation earlier this week.  TEE and intracardiac echo at the time of his ablation was notable for mildly reduced LVEF (previously 50-55% by echo in October).  Today, Mr. Janssen reports that he is feeling well without any problems from his atrial fibrillation ablation earlier this week.  He denies chest pain, shortness of breath, palpitations, lightheadedness, or edema.  He has not had any pain, swelling, or bleeding from his groin sites.  He remains compliant with his medications, including apixaban.  --------------------------------------------------------------------------------------------------  Past Medical History:  Diagnosis Date   Anxiety    Depression    Dysrhythmia    Hypertension    Mineral deficiency    Paroxysmal atrial fibrillation (HCC)    PTSD (post-traumatic stress disorder)    Past Surgical History:  Procedure Laterality Date   APPENDECTOMY     ATRIAL FIBRILLATION ABLATION N/A 10/21/2021   Procedure: ATRIAL FIBRILLATION ABLATION;  Surgeon: Vickie Epley, MD;  Location: [facility];  Service: Cardiovascular;  Laterality: N/A;   EYE SURGERY     laceration to pupil   FRACTURE SURGERY Left    Rod- which was replaced due to rejection of original rod   SPINAL FUSION     SPLENECTOMY,  TOTAL     TEE WITHOUT CARDIOVERSION N/A 10/21/2021   Procedure: TRANSESOPHAGEAL ECHOCARDIOGRAM (TEE);  Surgeon: Vickie Epley, MD;  Location: [facility];  Service: Cardiovascular;  Laterality: N/A;    Current Meds  Medication Sig   buPROPion (WELLBUTRIN XL) 300 MG 24 hr tablet Take 1 tablet (300 mg total) by mouth daily.   colchicine 0.6 MG tablet Take 1 tablet (0.6 mg total) by mouth 2 (two) times daily for 5 days.   ELIQUIS 5 MG TABS tablet TAKE 1 TABLET BY MOUTH TWICE A DAY   EPINEPHrine (EPIPEN 2-PAK) 0.3 mg/0.3 mL IJ SOAJ injection Inject 0.3 mg into the muscle as needed for anaphylaxis.   metoprolol tartrate (LOPRESSOR) 25 MG tablet Take 1 tablet (25 mg total) by mouth 2 (two) times daily.   pantoprazole (PROTONIX) 40 MG tablet Take 1 tablet (40 mg total) by mouth daily.   rOPINIRole (REQUIP) 1 MG tablet Take 1 tablet (1 mg total) by mouth at bedtime.   sertraline (ZOLOFT) 50 MG tablet Take 1 tablet (50 mg total) by mouth daily.   traZODone (DESYREL) 50 MG tablet Take 0.5-1 tablets (25-50 mg total) by mouth at bedtime as needed for sleep.   valsartan-hydrochlorothiazide (DIOVAN-HCT) 160-12.5 MG tablet Take 1 tablet by mouth daily.    Allergies: Aspirin, Bee venom, Sulfa antibiotics, and Penicillins  Social History   Tobacco Use   Smoking status: Former    Packs/day: 1.00    Years: 25.00    Pack years: 25.00    Types: Cigarettes   Smokeless tobacco: Never  Vaping Use   Vaping Use: Never used  Substance Use Topics   Alcohol use: Not Currently   Drug use: Yes    Types: Marijuana    Family History  Problem Relation Age of Onset   Hypertension Mother    Atrial fibrillation Mother    Hypertension Father    Heart disease Maternal Grandmother    Hypertension Maternal Grandmother    Hypertension Paternal Grandfather    Coronary artery disease Paternal Grandfather    Other Son        Trisomy 21    Review of Systems: A 12-system review of systems was  performed and was negative except as noted in the HPI.  --------------------------------------------------------------------------------------------------  Physical Exam: BP 110/80 (BP Location: Left Arm, Patient Position: Sitting, Cuff Size: Normal)    Pulse 94    Ht '5\' 6"'  (1.676 m)    Wt 173 lb (78.5 kg)    SpO2 98%    BMI 27.92 kg/m   General:  NAD. Neck: No JVD or HJR. Lungs: Clear to auscultation bilaterally without wheezes or crackles. Heart: Regular rate and rhythm with frequent extrasystoles.  No murmurs, rubs, or gallops. Abdomen: Soft, nontender, nondistended. Extremities: No lower extremity edema.  Bilateral groin sites with minimal bruising; no hematoma noted.  EKG: Sinus rhythm with frequent PACs and nonspecific T wave changes.  Lab Results  Component Value Date   WBC 9.6 10/13/2021   HGB 15.9 10/13/2021   HCT 43.8 10/13/2021   MCV 93.4 10/13/2021   PLT 256 10/13/2021    Lab Results  Component Value Date   NA 137 10/18/2021   K 3.9 10/18/2021   CL 102 10/18/2021   CO2 23 10/18/2021   BUN 18 10/18/2021   CREATININE 1.16 10/18/2021   GLUCOSE 89 10/18/2021   ALT 28 10/18/2021    Lab Results  Component Value Date   CHOL 197 09/23/2021   HDL 38 (L) 09/23/2021   LDLCALC 128 (H) 09/23/2021   TRIG 171 (H) 09/23/2021   CHOLHDL 5.2 (H) 09/23/2021    --------------------------------------------------------------------------------------------------  ASSESSMENT AND PLAN: Paroxysmal atrial fibrillation: Mr. Granja is doing well following his atrial fibrillation ablation with Dr. Quentin Ore earlier this week.  EKG today is irregular, though I believe it represents sinus rhythm with frequent PACs.  Hopefully, PACs will decrease/resolve as inflammation from ablation subsides.  He should continue his current medications including metoprolol and anticoagulation with apixaban.  He is scheduled for follow-up in the atrial fibrillation clinic early next month.  Follow-up with Dr.  Quentin Ore can be arranged at that time as well.  Cardiomyopathy: Mildly reduced LVEF noted on recent TEE and ICE.  Mr. Pondexter does not have any heart failure symptoms.  He appears euvolemic on exam today.  Prior transthoracic echocardiogram in October showed low normal LVEF.  We will continue his current regimen of metoprolol and valsartan-HCTZ.  Follow-up echo in 3-6 months will be considered to reassess LVEF.  If his LVEF does not normalize, ischemia evaluation will need to be discussed at our follow-up visit.  Hypertension: Blood pressure well controlled today.  Continue metoprolol and valsartan-HCTZ.  Follow-up: Return to clinic in 3 months.  Nelva Bush, MD 10/25/2021 10:44 AM

## 2021-10-25 NOTE — Patient Instructions (Signed)
Medication Instructions:  ° °Your physician recommends that you continue on your current medications as directed. Please refer to the Current Medication list given to you today. ° °*If you need a refill on your cardiac medications before your next appointment, please call your pharmacy* ° ° °Lab Work: ° °None ordered ° °Testing/Procedures: ° °None ordered ° ° °Follow-Up: °At [facility], you and your health needs are our priority.  As part of our continuing mission to provide you with exceptional heart care, we have created designated Provider Care Teams.  These Care Teams include your primary Cardiologist (physician) and Advanced Practice Providers (APPs -  Physician Assistants and Nurse Practitioners) who all work together to provide you with the care you need, when you need it. ° °We recommend signing up for the patient portal called "MyChart".  Sign up information is provided on this After Visit Summary.  MyChart is used to connect with patients for Virtual Visits (Telemedicine).  Patients are able to view lab/test results, encounter notes, upcoming appointments, etc.  Non-urgent messages can be sent to your provider as well.   °To learn more about what you can do with MyChart, go to [email or web address].   ° °Your next appointment:   °6 month(s) ° °The format for your next appointment:   °In Person ° °Provider:   °You may see Christopher End, MD or one of the following Advanced Practice Providers on your designated Care Team:   °Christopher Berge, NP °Ryan Dunn, PA-C °Cadence Furth, PA-C °

## 2021-10-28 NOTE — Telephone Encounter (Signed)
Appt changed

## 2021-11-14 LAB — ECHO TEE

## 2021-11-18 ENCOUNTER — Encounter (HOSPITAL_COMMUNITY): Payer: Self-pay | Admitting: Physician Assistant

## 2021-11-18 ENCOUNTER — Ambulatory Visit (INDEPENDENT_AMBULATORY_CARE_PROVIDER_SITE_OTHER): Payer: Medicaid Other | Admitting: Nurse Practitioner

## 2021-11-18 ENCOUNTER — Encounter: Payer: Self-pay | Admitting: Nurse Practitioner

## 2021-11-18 ENCOUNTER — Other Ambulatory Visit: Payer: Self-pay

## 2021-11-18 ENCOUNTER — Ambulatory Visit (HOSPITAL_COMMUNITY)
Admission: RE | Admit: 2021-11-18 | Discharge: 2021-11-18 | Disposition: A | Discharge status: Home / Self Care | Payer: Medicaid Other | Source: Ambulatory Visit | Attending: Physician Assistant | Admitting: Physician Assistant

## 2021-11-18 VITALS — BP 114/77 | HR 66 | Temp 98.1°F | Wt 174.8 lb

## 2021-11-18 VITALS — BP 118/82 | HR 73 | Ht 66.0 in | Wt 174.4 lb

## 2021-11-18 DIAGNOSIS — F3289 Other specified depressive episodes: Secondary | ICD-10-CM | POA: Diagnosis not present

## 2021-11-18 DIAGNOSIS — I5022 Chronic systolic (congestive) heart failure: Secondary | ICD-10-CM | POA: Diagnosis not present

## 2021-11-18 DIAGNOSIS — D6869 Other thrombophilia: Secondary | ICD-10-CM | POA: Insufficient documentation

## 2021-11-18 DIAGNOSIS — I10 Essential (primary) hypertension: Secondary | ICD-10-CM | POA: Diagnosis not present

## 2021-11-18 DIAGNOSIS — Z7901 Long term (current) use of anticoagulants: Secondary | ICD-10-CM | POA: Insufficient documentation

## 2021-11-18 DIAGNOSIS — F419 Anxiety disorder, unspecified: Secondary | ICD-10-CM

## 2021-11-18 DIAGNOSIS — I4892 Unspecified atrial flutter: Secondary | ICD-10-CM | POA: Insufficient documentation

## 2021-11-18 DIAGNOSIS — F4310 Post-traumatic stress disorder, unspecified: Secondary | ICD-10-CM | POA: Insufficient documentation

## 2021-11-18 DIAGNOSIS — I480 Paroxysmal atrial fibrillation: Secondary | ICD-10-CM

## 2021-11-18 DIAGNOSIS — I110 Hypertensive heart disease with heart failure: Secondary | ICD-10-CM | POA: Insufficient documentation

## 2021-11-18 MED ORDER — ROPINIROLE HCL 1 MG PO TABS: 1.0000 mg | ORAL_TABLET | Freq: Every day | ORAL | 1 refills | Status: DC

## 2021-11-18 NOTE — Progress Notes (Signed)
Primary Care Physician: Larae Grooms, NP Primary Cardiologist: Dr End Primary Electrophysiologist: Dr Lalla Brothers Referring Physician: Dr Suszanne Conners is a 46 y.o. male with a history of HTN, PTSD, systolic dysfunction, atrial flutter, atrial fibrillation who presents for follow up in the [facility]. Patient is on Eliquis for a CHADS2VASC score of 2. He underwent afib and flutter ablation on 10/21/21. Patient reports that he has done well since the procedure with no heart racing or palpitations. He denies CP, swallowing pain, or groin issues. He is in SR today.  Today, he denies symptoms of palpitations, chest pain, shortness of breath, orthopnea, PND, lower extremity edema, dizziness, presyncope, syncope, snoring, daytime somnolence, bleeding, or neurologic sequela. The patient is tolerating medications without difficulties and is otherwise without complaint today.    Atrial Fibrillation Risk Factors:  he does not have symptoms or diagnosis of sleep apnea. he does not have a history of rheumatic fever.   he has a BMI of Body mass index is 28.15 kg/m.Marland Kitchen Filed Weights   11/18/21 1315  Weight: 79.1 kg    Family History  Problem Relation Age of Onset   Hypertension Mother    Atrial fibrillation Mother    Hypertension Father    Heart disease Maternal Grandmother    Hypertension Maternal Grandmother    Hypertension Paternal Grandfather    Coronary artery disease Paternal Grandfather    Other Son        Trisomy 21     Atrial Fibrillation Management history:  Previous antiarrhythmic drugs: none Previous cardioversions: none Previous ablations: 10/21/21 CHADS2VASC score: 2 Anticoagulation history: Eliquis   Past Medical History:  Diagnosis Date   Anxiety    Depression    Dysrhythmia    Hypertension    Mineral deficiency    Paroxysmal atrial fibrillation (HCC)    PTSD (post-traumatic stress disorder)    Past Surgical History:   Procedure Laterality Date   APPENDECTOMY     ATRIAL FIBRILLATION ABLATION N/A 10/21/2021   Procedure: ATRIAL FIBRILLATION ABLATION;  Surgeon: Lanier Prude, MD;  Location: [facility];  Service: Cardiovascular;  Laterality: N/A;   EYE SURGERY     laceration to pupil   FRACTURE SURGERY Left    Rod- which was replaced due to rejection of original rod   SPINAL FUSION     SPLENECTOMY, TOTAL     TEE WITHOUT CARDIOVERSION N/A 10/21/2021   Procedure: TRANSESOPHAGEAL ECHOCARDIOGRAM (TEE);  Surgeon: Lanier Prude, MD;  Location: [facility];  Service: Cardiovascular;  Laterality: N/A;    Current Outpatient Medications  Medication Sig Dispense Refill   buPROPion (WELLBUTRIN XL) 300 MG 24 hr tablet Take 1 tablet (300 mg total) by mouth daily. 90 tablet 1   ELIQUIS 5 MG TABS tablet TAKE 1 TABLET BY MOUTH TWICE A DAY 180 tablet 0   EPINEPHrine (EPIPEN 2-PAK) 0.3 mg/0.3 mL IJ SOAJ injection Inject 0.3 mg into the muscle as needed for anaphylaxis. 1 each 1   metoprolol tartrate (LOPRESSOR) 25 MG tablet Take 1 tablet (25 mg total) by mouth 2 (two) times daily. 30 tablet 5   pantoprazole (PROTONIX) 40 MG tablet Take 1 tablet (40 mg total) by mouth daily. 45 tablet 0   rOPINIRole (REQUIP) 1 MG tablet Take 1 tablet (1 mg total) by mouth at bedtime. 90 tablet 1   sertraline (ZOLOFT) 50 MG tablet Take 1 tablet (50 mg total) by mouth daily. 90 tablet 1  traZODone (DESYREL) 50 MG tablet Take 0.5-1 tablets (25-50 mg total) by mouth at bedtime as needed for sleep. 90 tablet 1   colchicine 0.6 MG tablet Take 1 tablet (0.6 mg total) by mouth 2 (two) times daily for 5 days. 10 tablet 0   valsartan-hydrochlorothiazide (DIOVAN-HCT) 160-12.5 MG tablet Take 1 tablet by mouth daily. (Patient not taking: Reported on 11/18/2021) 90 tablet 1   No current facility-administered medications for this encounter.    Allergies  Allergen Reactions   Aspirin Shortness Of Breath   Bee Venom Anaphylaxis    Sulfa Antibiotics Anaphylaxis   Penicillins Rash    Social History   Socioeconomic History   Marital status: Single    Spouse name: Not on file   Number of children: Not on file   Years of education: Not on file   Highest education level: Not on file  Occupational History   Not on file  Tobacco Use   Smoking status: Former    Packs/day: 1.00    Years: 25.00    Pack years: 25.00    Types: Cigarettes   Smokeless tobacco: Never   Tobacco comments:    Former smoker 11/18/21  Vaping Use   Vaping Use: Never used  Substance and Sexual Activity   Alcohol use: Not Currently   Drug use: Yes    Frequency: 7.0 times per week    Types: Marijuana    Comment: daily   Sexual activity: Yes  Other Topics Concern   Not on file  Social History Narrative   Not on file   Social Determinants of Health   Financial Resource Strain: Not on file  Food Insecurity: Not on file  Transportation Needs: Not on file  Physical Activity: Not on file  Stress: Not on file  Social Connections: Not on file  Intimate Partner Violence: Not on file     ROS- All systems are reviewed and negative except as per the HPI above.  Physical Exam: Vitals:   11/18/21 1315  BP: 118/82  Pulse: 73  Weight: 79.1 kg  Height: 5\' 6"  (1.676 m)    GEN- The patient is a well appearing male, alert and oriented x 3 today.   Head- normocephalic, atraumatic Eyes-  Sclera clear, conjunctiva pink Ears- hearing intact Oropharynx- clear Neck- supple  Lungs- Clear to ausculation bilaterally, normal work of breathing Heart- Regular rate and rhythm, no murmurs, rubs or gallops  GI- soft, NT, ND, + BS Extremities- no clubbing, cyanosis, or edema MS- no significant deformity or atrophy Skin- no rash or lesion Psych- euthymic mood, full affect Neuro- strength and sensation are intact  Wt Readings from Last 3 Encounters:  11/18/21 79.1 kg  10/25/21 78.5 kg  10/18/21 78.4 kg    EKG today demonstrates  SR,  PACs Vent. rate 73 BPM PR interval 146 ms QRS duration 86 ms QT/QTcB 398/438 ms  Echo 08/06/21 demonstrated   1. Left ventricular ejection fraction, by estimation, is 50 to 55%. The  left ventricle has low normal function. The left ventricle has no regional  wall motion abnormalities. Left ventricular diastolic parameters were  normal. The average left ventricular   global longitudinal strain is -15.7 %. The global longitudinal strain is  normal.   2. Right ventricular systolic function is normal. The right ventricular  size is normal.   3. The mitral valve is normal in structure. Mild mitral valve  regurgitation. No evidence of mitral stenosis.   4. The aortic valve is normal in  structure. Aortic valve regurgitation is  not visualized. No aortic stenosis is present.   5. The inferior vena cava is normal in size with greater than 50%  respiratory variability, suggesting right atrial pressure of 3 mmHg.   6. Thin walled interatrial septum bowing into the right atrium, small  doppler shunt noted consistent with small PFO. Bubble study not performed.   TEE 10/21/21  1. Left ventricular ejection fraction, by estimation, is 40 to 45%. The  left ventricle has mildly decreased function. The left ventricle  demonstrates global hypokinesis.   2. Right ventricular systolic function is normal. The right ventricular  size is normal.   3. No left atrial/left atrial appendage thrombus was detected.   4. The mitral valve is normal in structure. Trivial mitral valve  regurgitation.   5. The aortic valve is normal in structure. Aortic valve regurgitation is  not visualized.   6. Evidence of atrial level shunting detected by color flow Doppler.  There is a small patent foramen ovale with predominantly left to right  shunting across the atrial septum.   Epic records are reviewed at length today  CHA2DS2-VASc Score = 2  The patient's score is based upon: CHF History: 1 HTN History: 1 Diabetes  History: 0 Stroke History: 0 Vascular Disease History: 0 Age Score: 0 Gender Score: 0       ASSESSMENT AND PLAN: 1. Paroxysmal Atrial Fibrillation/atrial flutter The patient's CHA2DS2-VASc score is 2, indicating a 2.2% annual risk of stroke.   S/p afib and flutter ablation 10/21/21 Patient appears to be maintaining SR Continue Eliquis 5 mg BID with no missed doses for 3 months post ablation.  Continue Lopressor 25 mg BID  2. Secondary Hypercoagulable State (ICD10:  D68.69) The patient is at significant risk for stroke/thromboembolism based upon his CHA2DS2-VASc Score of 2.  Continue Apixaban (Eliquis).   3. HTN Stable, no changes today.  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 No signs or symptoms of fluid overload. Plan for repeat echo noted.   Follow up with Dr Quentin Ore per recall.    [facility] [address] [place], [place] [address] [phone] 11/18/2021 1:47 PM

## 2021-11-18 NOTE — Assessment & Plan Note (Signed)
Chronic.  Controlled.  Continue with current medication regimen on Wellbutrin 300mg daily.  Refill sent today.  Labs ordered today.  Return to clinic in 6 months for reevaluation.  Call sooner if concerns arise.  °

## 2021-11-18 NOTE — Assessment & Plan Note (Signed)
Chronic.  Controlled.  Continue with current medication regimen on Wellbutrin 300mg  daily.  Refill sent today.  Labs ordered today.  Return to clinic in 6 months for reevaluation.  Call sooner if concerns arise.

## 2021-11-18 NOTE — Addendum Note (Signed)
Encounter addended by: Danice Goltz, PA on: 11/18/2021 3:51 PM  Actions taken: Level of Service modified

## 2021-11-18 NOTE — Progress Notes (Signed)
° °BP 114/77    Pulse 66    Temp 98.1 °F (36.7 °C) (Oral)    Wt 174 lb 12.8 oz (79.3 kg)    SpO2 96%    BMI 28.21 kg/m²   ° °Subjective:  ° ° Patient ID: Patrick Short, male    DOB: 10/14/1975, 45 y.o.   MRN: [id number] ° °HPI: °Kyion T Kercheval is a 45 y.o. male ° °Chief Complaint  °Patient presents with  ° Depression  °  1 month f/up  ° Anxiety  ° °HYPERTENSION °Hypertension status: stable  °Satisfied with current treatment? yes °Duration of hypertension: years °BP monitoring frequency:  daily °BP range: 120/70 °BP medication side effects:  no °Medication compliance: excellent compliance °Previous BP meds:Metoprolol °Aspirin: no °Recurrent headaches: no °Visual changes: no °Palpitations: no °Dyspnea: no °Chest pain: no °Lower extremity edema: no °Dizzy/lightheaded: no °Patient has stopped the Valsartan/ HCTZ and smoking.  He had an ablation in January.  ° °ANXIETY/DEPRESSION °Patient states that he is having doing well with his anxiety.  States it fluctuates at times but overall it is well controlled.  Denies symptoms today.  °Flowsheet Row Office Visit from 11/18/2021 in [facility]  °PHQ-9 Total Score 1  ° °  ° °GAD 7 : Generalized Anxiety Score 11/18/2021 10/18/2021 09/23/2021 09/10/2021  °Nervous, Anxious, on Edge 1 1 1 1  °Control/stop worrying 0 1 0 1  °Worry too much - different things 0 0 0 1  °Trouble relaxing 1 1 1 1  °Restless 1 2 1 0  °Easily annoyed or irritable 1 2 0 1  °Afraid - awful might happen 0 1 0 0  °Total GAD 7 Score 4 8 3 5  °Anxiety Difficulty Somewhat difficult Somewhat difficult Somewhat difficult Somewhat difficult  ° ° ° °Relevant past medical, surgical, family and social history reviewed and updated as indicated. Interim medical history since our last visit reviewed. °Allergies and medications reviewed and updated. ° °Review of Systems  °Eyes:  Negative for visual disturbance.  °Respiratory:  Negative for shortness of breath.   °Cardiovascular:  Negative for chest pain and leg  swelling.  °Neurological:  Negative for light-headedness and headaches.  °     Restless leg  °Psychiatric/Behavioral:  Positive for dysphoric mood. Negative for suicidal ideas. The patient is nervous/anxious.   ° °Per HPI unless specifically indicated above ° °   °Objective:  °  °BP 114/77    Pulse 66    Temp 98.1 °F (36.7 °C) (Oral)    Wt 174 lb 12.8 oz (79.3 kg)    SpO2 96%    BMI 28.21 kg/m²   °Wt Readings from Last 3 Encounters:  °11/18/21 174 lb 12.8 oz (79.3 kg)  °11/18/21 174 lb 6.4 oz (79.1 kg)  °10/25/21 173 lb (78.5 kg)  °  °Physical Exam °Vitals and nursing note reviewed.  °Constitutional:   °   General: He is not in acute distress. °   Appearance: Normal appearance. He is not ill-appearing, toxic-appearing or diaphoretic.  °HENT:  °   Head: Normocephalic.  °   Right Ear: External ear normal.  °   Left Ear: External ear normal.  °   Nose: Nose normal. No congestion or rhinorrhea.  °   Mouth/Throat:  °   Mouth: Mucous membranes are moist.  °Eyes:  °   General:     °   Right eye: No discharge.     °   Left eye: No discharge.  °     Extraocular Movements: Extraocular movements intact.  °   Conjunctiva/sclera: Conjunctivae normal.  °   Pupils: Pupils are equal, round, and reactive to light.  °Cardiovascular:  °   Rate and Rhythm: Normal rate and regular rhythm.  °   Heart sounds: No murmur heard. °Pulmonary:  °   Effort: Pulmonary effort is normal. No respiratory distress.  °   Breath sounds: Normal breath sounds. No wheezing, rhonchi or rales.  °Abdominal:  °   General: Abdomen is flat. Bowel sounds are normal.  °Musculoskeletal:  °   Cervical back: Normal range of motion and neck supple.  °Skin: °   General: Skin is warm and dry.  °   Capillary Refill: Capillary refill takes less than 2 seconds.  °Neurological:  °   General: No focal deficit present.  °   Mental Status: He is alert and oriented to person, place, and time.  °Psychiatric:     °   Mood and Affect: Mood normal.     °   Behavior: Behavior normal.      °   Thought Content: Thought content normal.     °   Judgment: Judgment normal.  ° ° °Results for orders placed or performed during the hospital encounter of 10/21/21  °POCT Activated clotting time  °Result Value Ref Range  ° Activated Clotting Time 323 seconds  °POCT Activated clotting time  °Result Value Ref Range  ° Activated Clotting Time 341 seconds  °POCT Activated clotting time  °Result Value Ref Range  ° Activated Clotting Time 353 seconds  °ECHO TEE  °Result Value Ref Range  ° BP 108/69 mmHg  ° °   °Assessment & Plan:  ° °Problem List Items Addressed This Visit   ° °  ° Cardiovascular and Mediastinum  ° Essential hypertension  °  Chronic.  Controlled.  Continue with current medication regimen on Metoprolol 25mg BID.  Stopped the Valsartan and HCTZ due to low blood pressures.  Continue to check blood pressure at home.  Labs ordered today.  Return to clinic in 6 months for reevaluation.  Call sooner if concerns arise.  ° °  °  ° Relevant Orders  ° Comp Met (CMET)  ° CBC w/Diff  °  ° Other  ° Depression  °  Chronic.  Controlled.  Continue with current medication regimen on Wellbutrin 300mg daily.  Refill sent today.  Labs ordered today.  Return to clinic in 6 months for reevaluation.  Call sooner if concerns arise.  °  °  ° Anxiety - Primary  °  Chronic.  Controlled.  Continue with current medication regimen on Wellbutrin 300mg daily.  Refill sent today.  Labs ordered today.  Return to clinic in 6 months for reevaluation.  Call sooner if concerns arise.  ° °  °  ° Relevant Orders  ° Comp Met (CMET)  ° CBC w/Diff  °  ° °Follow up plan: °Return in about 6 months (around 05/18/2022) for Depression/Anxiety FU, BP Check. ° ° ° ° ° ° °

## 2021-11-18 NOTE — Assessment & Plan Note (Signed)
Chronic.  Controlled.  Continue with current medication regimen on Metoprolol 25mg  BID.  Stopped the Valsartan and HCTZ due to low blood pressures.  Continue to check blood pressure at home.  Labs ordered today.  Return to clinic in 6 months for reevaluation.  Call sooner if concerns arise.

## 2021-11-19 ENCOUNTER — Encounter: Payer: Self-pay | Admitting: Nurse Practitioner

## 2021-11-19 LAB — CBC WITH DIFFERENTIAL/PLATELET
Basophils Absolute: 0 10*3/uL (ref 0.0–0.2)
Basos: 1 %
EOS (ABSOLUTE): 0.1 10*3/uL (ref 0.0–0.4)
Eos: 1 %
Hematocrit: 44.6 % (ref 37.5–51.0)
Hemoglobin: 15.4 g/dL (ref 13.0–17.7)
Immature Grans (Abs): 0 10*3/uL (ref 0.0–0.1)
Immature Granulocytes: 0 %
Lymphocytes Absolute: 1.7 10*3/uL (ref 0.7–3.1)
Lymphs: 26 %
MCH: 32.6 pg (ref 26.6–33.0)
MCHC: 34.5 g/dL (ref 31.5–35.7)
MCV: 95 fL (ref 79–97)
Monocytes Absolute: 0.5 10*3/uL (ref 0.1–0.9)
Monocytes: 7 %
Neutrophils Absolute: 4.4 10*3/uL (ref 1.4–7.0)
Neutrophils: 65 %
Platelets: 226 10*3/uL (ref 150–450)
RBC: 4.72 x10E6/uL (ref 4.14–5.80)
RDW: 11.9 % (ref 11.6–15.4)
WBC: 6.7 10*3/uL (ref 3.4–10.8)

## 2021-11-19 LAB — COMPREHENSIVE METABOLIC PANEL
ALT: 19 IU/L (ref 0–44)
AST: 17 IU/L (ref 0–40)
Albumin/Globulin Ratio: 2 (ref 1.2–2.2)
Albumin: 5 g/dL (ref 4.0–5.0)
Alkaline Phosphatase: 75 IU/L (ref 44–121)
BUN/Creatinine Ratio: 12 (ref 9–20)
BUN: 12 mg/dL (ref 6–24)
Bilirubin Total: 0.4 mg/dL (ref 0.0–1.2)
CO2: 23 mmol/L (ref 20–29)
Calcium: 10 mg/dL (ref 8.7–10.2)
Chloride: 102 mmol/L (ref 96–106)
Creatinine, Ser: 1.03 mg/dL (ref 0.76–1.27)
Globulin, Total: 2.5 g/dL (ref 1.5–4.5)
Glucose: 90 mg/dL (ref 70–99)
Potassium: 4 mmol/L (ref 3.5–5.2)
Sodium: 139 mmol/L (ref 134–144)
Total Protein: 7.5 g/dL (ref 6.0–8.5)
eGFR: 91 mL/min/{1.73_m2} (ref >59)

## 2021-11-19 NOTE — Progress Notes (Signed)
HI [employer].  Your lab work looks good.  No concerns at this time.  Please let me know if you have any questions.

## 2021-11-26 ENCOUNTER — Telehealth: Payer: Self-pay | Admitting: Nurse Practitioner

## 2021-11-26 NOTE — Telephone Encounter (Signed)
Medication Refill - Medication:  traZODone (DESYREL) 50 MG tablet  Has the patient contacted their pharmacy? Yes.   Pt states PCP instructed him to take 0.5 to 2 tablets a day, but on the bottle its instructing 0.5-1 and its not leaving him enough,   Preferred Pharmacy (with phone number or street name):  [id number] - [place], [place] - [address]  [address] [place] [address]  Phone:  [phone]  Fax:  [phone]   Has the patient been seen for an appointment in the last year OR does the patient have an upcoming appointment? Yes.    Agent: Please be advised that RX refills may take up to 3 business days. We ask that you follow-up with your pharmacy.

## 2021-11-26 NOTE — Telephone Encounter (Signed)
Patient called and says PCP instructed hm to take 0.5-2 tabs a day of Trazodone and he's running out of medication. Per OV note 09/10/21, noted to take 0.5-2 tabs a day. The Rx sent on 09/10/21 reflects 0.5-1 tab. He will need a new Rx with 0.5-2 tabs daily.   OV note 09/10/21 Sleep disturbance       Not well controlled.  Has tried benadryl and Melatonin which did not work for him.  Will try Trazodone at 50mg  daily. Can take 1/2 or increase to 2 tabs if needed.

## 2021-11-27 ENCOUNTER — Other Ambulatory Visit: Payer: Self-pay | Admitting: Student

## 2021-11-27 ENCOUNTER — Other Ambulatory Visit: Payer: Self-pay | Admitting: *Deleted

## 2021-11-27 MED ORDER — TRAZODONE HCL 50 MG PO TABS: 50.0000 mg | ORAL_TABLET | Freq: Every evening | ORAL | 1 refills | Status: DC | PRN

## 2021-11-27 NOTE — Telephone Encounter (Signed)
Pt's pharmacy is requesting a refill on pantoprazole. Would Dr. Lambert like to refill this medication? Please address 

## 2021-11-27 NOTE — Telephone Encounter (Signed)
Updated prescription sent to the pharmacy. 

## 2021-12-17 ENCOUNTER — Other Ambulatory Visit: Payer: Self-pay | Admitting: Nurse Practitioner

## 2021-12-17 NOTE — Telephone Encounter (Signed)
Requested Prescriptions  ?Pending Prescriptions Disp Refills  ?? ELIQUIS 5 MG TABS tablet [Pharmacy Med Name: ELIQUIS 5 MG TABLET] 180 tablet 0  ?  Sig: TAKE 1 TABLET BY MOUTH TWICE A DAY  ?  ? Hematology:  Anticoagulants - apixaban Passed - 12/17/2021  9:09 AM  ?  ?  Passed - PLT in normal range and within 360 days  ?  Platelets  ?Date Value Ref Range Status  ?11/18/2021 226 150 - 450 x10E3/uL Final  ?   ?  ?  Passed - HGB in normal range and within 360 days  ?  Hemoglobin  ?Date Value Ref Range Status  ?11/18/2021 15.4 13.0 - 17.7 g/dL Final  ?   ?  ?  Passed - HCT in normal range and within 360 days  ?  Hematocrit  ?Date Value Ref Range Status  ?11/18/2021 44.6 37.5 - 51.0 % Final  ?   ?  ?  Passed - Cr in normal range and within 360 days  ?  Creatinine, Ser  ?Date Value Ref Range Status  ?11/18/2021 1.03 0.76 - 1.27 mg/dL Final  ?   ?  ?  Passed - AST in normal range and within 360 days  ?  AST  ?Date Value Ref Range Status  ?11/18/2021 17 0 - 40 IU/L Final  ?   ?  ?  Passed - ALT in normal range and within 360 days  ?  ALT  ?Date Value Ref Range Status  ?11/18/2021 19 0 - 44 IU/L Final  ?   ?  ?  Passed - Valid encounter within last 12 months  ?  Recent Outpatient Visits   ?      ? 4 weeks ago Anxiety  ? [place], NP  ? 2 months ago Hypocalcemia  ? [place], NP  ? 2 months ago Pre-op exam  ? [place], NP  ? 3 months ago Atrial fibrillation, unspecified type [facility])  ? [place], NP  ? 5 months ago Atrial fibrillation, unspecified type [facility])  ? [facility] Jon Billings, NP  ?  ?  ?Future Appointments   ?        ? In 6 days Jon Billings, NP [facility], [place]  ? In 5 months Jon Billings, NP [facility]  ?  ? ?  ?  ?  ? ? ?

## 2021-12-23 ENCOUNTER — Ambulatory Visit (INDEPENDENT_AMBULATORY_CARE_PROVIDER_SITE_OTHER): Payer: Medicaid Other | Admitting: Nurse Practitioner

## 2021-12-23 ENCOUNTER — Encounter: Payer: Self-pay | Admitting: Nurse Practitioner

## 2021-12-23 ENCOUNTER — Other Ambulatory Visit: Payer: Self-pay

## 2021-12-23 VITALS — BP 124/81 | HR 64 | Temp 98.4°F | Wt 178.0 lb

## 2021-12-23 DIAGNOSIS — F3289 Other specified depressive episodes: Secondary | ICD-10-CM | POA: Diagnosis not present

## 2021-12-23 DIAGNOSIS — F419 Anxiety disorder, unspecified: Secondary | ICD-10-CM | POA: Diagnosis not present

## 2021-12-23 DIAGNOSIS — G2581 Restless legs syndrome: Secondary | ICD-10-CM

## 2021-12-23 DIAGNOSIS — I480 Paroxysmal atrial fibrillation: Secondary | ICD-10-CM

## 2021-12-23 DIAGNOSIS — I429 Cardiomyopathy, unspecified: Secondary | ICD-10-CM

## 2021-12-23 DIAGNOSIS — I10 Essential (primary) hypertension: Secondary | ICD-10-CM

## 2021-12-23 MED ORDER — CITALOPRAM HYDROBROMIDE 10 MG PO TABS: 10.0000 mg | ORAL_TABLET | Freq: Every day | ORAL | 0 refills | Status: DC

## 2021-12-23 NOTE — Assessment & Plan Note (Signed)
Chronic.  Controlled.  Continue with current medication regimen on Metoprolol 25mg  BID.  Continue to check blood pressure at home.  Labs ordered today.  Return to clinic in 3 months for reevaluation.  Call sooner if concerns arise.  ? ?

## 2021-12-23 NOTE — Assessment & Plan Note (Signed)
Chronic.  Controlled.  Continue with current medication regimen of Eluqius 5mg  BID and Metoprolol 25mg  BID.  He has follow up Scheduled with Cardiology next month.  Labs ordered today.  Return to clinic in 3 months for reevaluation.  Call sooner if concerns arise.  ? ?

## 2021-12-23 NOTE — Assessment & Plan Note (Signed)
Chronic.  Controlled.  Continue with current medication regimen on Requip 1mg .  Labs ordered today.  Return to clinic in 3 months for reevaluation.  Call sooner if concerns arise.  ? ?

## 2021-12-23 NOTE — Progress Notes (Signed)
? ?BP 124/81   Pulse 64   Temp 98.4 ?F (36.9 ?C) (Oral)   Wt 178 lb (80.7 kg)   SpO2 97%   BMI 28.73 kg/m?   ? ?Subjective:  ? ? Patient ID: Patrick Short, male    DOB: Jun 16, 1976, 46 y.o.   MRN: [id number] ? ?HPI: ?Patrick Short is a 46 y.o. male ? ?Chief Complaint  ?Patient presents with  ? Anxiety  ? Depression  ? Hyperlipidemia  ? Hypertension  ? ?HYPERTENSION ?Hypertension status: stable  ?Satisfied with current treatment? yes ?Duration of hypertension: years ?BP monitoring frequency:  daily ?BP range: 120/80 ?BP medication side effects:  no ?Medication compliance: excellent compliance ?Previous BP meds:Metoprolol ?Aspirin: no ?Recurrent headaches: no ?Visual changes: no ?Palpitations: no ?Dyspnea: no ?Chest pain: no ?Lower extremity edema: no ?Dizzy/lightheaded: no ?Patient states he has been doing well since his ablation.  States he feels better physically. He has a follow up with Dr. Mardene Speak office next month.  ? ?ANXIETY/DEPRESSION ?Patient states that he has had two ketamine infusions.  States the first one was not a good experience.  He was having hallucinations.  But then the second was better.   He stopped taking the Wellbutrin and Sertraline because he didn't feel like it was helping him.  States that they were making him more emotional. He feels like his anxiety is elevated at visit today.  He stopped them about a month ago.   ? ? ?[place] Office Visit from 12/23/2021 in [place]  ?PHQ-9 Total Score 7  ? ?  ? ?GAD 7 : Generalized Anxiety Score 12/23/2021 11/18/2021 10/18/2021 09/23/2021  ?Nervous, Anxious, on Edge _0 ?Control/stop worrying 2 0 1 0  ?Worry too much - different things 2 0 0 0  ?Trouble relaxing _1 ?Restless _2 ?Easily annoyed or irritable _3 0  ?Afraid - awful might happen 0 0 1 0  ?Total GAD 7 Score _4 ?Anxiety Difficulty Somewhat difficult Somewhat difficult Somewhat difficult Somewhat difficult  ? ? ? ?Relevant past medical, surgical,  family and social history reviewed and updated as indicated. Interim medical history since our last visit reviewed. ?Allergies and medications reviewed and updated. ? ?Review of Systems  ?Eyes:  Negative for visual disturbance.  ?Respiratory:  Negative for shortness of breath.   ?Cardiovascular:  Negative for chest pain and leg swelling.  ?Neurological:  Negative for light-headedness and headaches.  ?     Restless leg  ?Psychiatric/Behavioral:  Positive for dysphoric mood. Negative for suicidal ideas. The patient is nervous/anxious.   ? ?Per HPI unless specifically indicated above ? ?   ?Objective:  ?  ?BP 124/81   Pulse 64   Temp 98.4 ?F (36.9 ?C) (Oral)   Wt 178 lb (80.7 kg)   SpO2 97%   BMI 28.73 kg/m?   ?Wt Readings from Last 3 Encounters:  ?12/23/21 178 lb (80.7 kg)  ?11/18/21 174 lb 6.4 oz (79.1 kg)  ?11/18/21 174 lb 12.8 oz (79.3 kg)  ?  ?Physical Exam ?Vitals and nursing note reviewed.  ?Constitutional:   ?   General: He is not in acute distress. ?   Appearance: Normal appearance. He is not ill-appearing, toxic-appearing or diaphoretic.  ?HENT:  ?   Head: Normocephalic.  ?   Right Ear: External ear normal.  ?   Left Ear: External ear normal.  ?   Nose: Nose  normal. No congestion or rhinorrhea.  ?   Mouth/Throat:  ?   Mouth: Mucous membranes are moist.  ?Eyes:  ?   General:     ?   Right eye: No discharge.     ?   Left eye: No discharge.  ?   Extraocular Movements: Extraocular movements intact.  ?   Conjunctiva/sclera: Conjunctivae normal.  ?   Pupils: Pupils are equal, round, and reactive to light.  ?Cardiovascular:  ?   Rate and Rhythm: Normal rate and regular rhythm.  ?   Heart sounds: No murmur heard. ?Pulmonary:  ?   Effort: Pulmonary effort is normal. No respiratory distress.  ?   Breath sounds: Normal breath sounds. No wheezing, rhonchi or rales.  ?Abdominal:  ?   General: Abdomen is flat. Bowel sounds are normal.  ?Musculoskeletal:  ?   Cervical back: Normal range of motion and neck supple.   ?Skin: ?   General: Skin is warm and dry.  ?   Capillary Refill: Capillary refill takes less than 2 seconds.  ?Neurological:  ?   General: No focal deficit present.  ?   Mental Status: He is alert and oriented to person, place, and time.  ?Psychiatric:     ?   Mood and Affect: Mood normal.     ?   Behavior: Behavior normal.     ?   Thought Content: Thought content normal.     ?   Judgment: Judgment normal.  ? ? ?Results for orders placed or performed in visit on 11/18/21  ?Comp Met (CMET)  ?Result Value Ref Range  ? Glucose 90 70 - 99 mg/dL  ? BUN 12 6 - 24 mg/dL  ? Creatinine, Ser 1.03 0.76 - 1.27 mg/dL  ? eGFR 91 >59 mL/min/1.73  ? BUN/Creatinine Ratio 12 9 - 20  ? Sodium 139 134 - 144 mmol/L  ? Potassium 4.0 3.5 - 5.2 mmol/L  ? Chloride 102 96 - 106 mmol/L  ? CO2 23 20 - 29 mmol/L  ? Calcium 10.0 8.7 - 10.2 mg/dL  ? Total Protein 7.5 6.0 - 8.5 g/dL  ? Albumin 5.0 4.0 - 5.0 g/dL  ? Globulin, Total 2.5 1.5 - 4.5 g/dL  ? Albumin/Globulin Ratio 2.0 1.2 - 2.2  ? Bilirubin Total 0.4 0.0 - 1.2 mg/dL  ? Alkaline Phosphatase 75 44 - 121 IU/L  ? AST 17 0 - 40 IU/L  ? ALT 19 0 - 44 IU/L  ?CBC w/Diff  ?Result Value Ref Range  ? WBC 6.7 3.4 - 10.8 x10E3/uL  ? RBC 4.72 4.14 - 5.80 x10E6/uL  ? Hemoglobin 15.4 13.0 - 17.7 g/dL  ? Hematocrit 44.6 37.5 - 51.0 %  ? MCV 95 79 - 97 fL  ? MCH 32.6 26.6 - 33.0 pg  ? MCHC 34.5 31.5 - 35.7 g/dL  ? RDW 11.9 11.6 - 15.4 %  ? Platelets 226 150 - 450 x10E3/uL  ? Neutrophils 65 Not Estab. %  ? Lymphs 26 Not Estab. %  ? Monocytes 7 Not Estab. %  ? Eos 1 Not Estab. %  ? Basos 1 Not Estab. %  ? Neutrophils Absolute 4.4 1.4 - 7.0 x10E3/uL  ? Lymphocytes Absolute 1.7 0.7 - 3.1 x10E3/uL  ? Monocytes Absolute 0.5 0.1 - 0.9 x10E3/uL  ? EOS (ABSOLUTE) 0.1 0.0 - 0.4 x10E3/uL  ? Basophils Absolute 0.0 0.0 - 0.2 x10E3/uL  ? Immature Granulocytes 0 Not Estab. %  ? Immature Grans (Abs) 0.0 0.0 - 0.1  x10E3/uL  ? ?   ?Assessment & Plan:  ? ?Problem List Items Addressed This Visit   ? ?  ? Cardiovascular and  Mediastinum  ? Essential hypertension - Primary  ?  Chronic.  Controlled.  Continue with current medication regimen on Metoprolol 63m BID.  Continue to check blood pressure at home.  Labs ordered today.  Return to clinic in 3 months for reevaluation.  Call sooner if concerns arise.  ? ?  ?  ? Relevant Orders  ? Comp Met (CMET)  ? [place]rial fibrillation ([place]  ?  Chronic.  Controlled.  Continue with current medication regimen of Eluqius 599mBID and Metoprolol 252mID.  He has follow up Scheduled with Cardiology next month.  Labs ordered today.  Return to clinic in 3 months for reevaluation.  Call sooner if concerns arise.  ? ?  ?  ? [place]omy[place] ([place]?  ? Other  ? Depression  ?  Chronic. Not well controlled.  Stopped both on Wellbutrin and Zoloft. Is receiving Ketamine infusions.  Will start Celexa 77m68mily.  Side effects and benefits of medications discussed during visit.  Follow up in 1 month for reevaluation.  Labs ordered today.   ?  ?  ? Relevant Medications  ? citalopram (CELEXA) 10 MG tablet  ? Anxiety  ?  Chronic. Not well controlled.  Stopped both on Wellbutrin and Zoloft. Is receiving Ketamine infusions.  Will start Celexa 77mg53mly.  Side effects and benefits of medications discussed during visit.  Follow up in 1 month for reevaluation.  Labs ordered today.   ?  ?  ? Relevant Medications  ? citalopram (CELEXA) 10 MG tablet  ? Other Relevant Orders  ? Comp Met (CMET)  ? Restless leg syndrome  ?  Chronic.  Controlled.  Continue with current medication regimen on Requip 1mg. 64mbs ordered today.  Return to clinic in 3 months for reevaluation.  Call sooner if concerns arise.  ? ?  ?  ?  ? ?Follow up plan: ?Return in about 1 month (around 01/23/2022) for Depression/Anxiety FU. ? ? ? ? ? ? ?

## 2021-12-23 NOTE — Assessment & Plan Note (Signed)
Chronic. Not well controlled.  Stopped both on Wellbutrin and Zoloft. Is receiving Ketamine infusions.  Will start Celexa 10mg  daily.  Side effects and benefits of medications discussed during visit.  Follow up in 1 month for reevaluation.  Labs ordered today.   ?

## 2021-12-23 NOTE — Assessment & Plan Note (Addendum)
Chronic. Not well controlled.  Stopped both on Wellbutrin and Zoloft. Is receiving Ketamine infusions.  Will start Celexa 10mg daily.  Side effects and benefits of medications discussed during visit.  Follow up in 1 month for reevaluation.  Labs ordered today.   ?

## 2021-12-24 LAB — COMPREHENSIVE METABOLIC PANEL
ALT: 23 IU/L (ref 0–44)
AST: 17 IU/L (ref 0–40)
Albumin/Globulin Ratio: 1.7 (ref 1.2–2.2)
Albumin: 4.5 g/dL (ref 4.0–5.0)
Alkaline Phosphatase: 63 IU/L (ref 44–121)
BUN/Creatinine Ratio: 12 (ref 9–20)
BUN: 11 mg/dL (ref 6–24)
Bilirubin Total: 0.2 mg/dL (ref 0.0–1.2)
CO2: 22 mmol/L (ref 20–29)
Calcium: 9.5 mg/dL (ref 8.7–10.2)
Chloride: 106 mmol/L (ref 96–106)
Creatinine, Ser: 0.95 mg/dL (ref 0.76–1.27)
Globulin, Total: 2.7 g/dL (ref 1.5–4.5)
Glucose: 103 mg/dL — ABNORMAL HIGH (ref 70–99)
Potassium: 4.2 mmol/L (ref 3.5–5.2)
Sodium: 141 mmol/L (ref 134–144)
Total Protein: 7.2 g/dL (ref 6.0–8.5)
eGFR: 100 mL/min/{1.73_m2} (ref >59)

## 2021-12-24 NOTE — Progress Notes (Signed)
Hi [place]. It was good to see you yesterday.  Your lab work looks good.  No concerns at this time.  Liver, Kidneys, and electrolytes look good.  Please let me know if you have any questions.

## 2022-01-04 ENCOUNTER — Other Ambulatory Visit: Payer: Self-pay | Admitting: Internal Medicine

## 2022-01-07 ENCOUNTER — Other Ambulatory Visit: Payer: Self-pay | Admitting: Nurse Practitioner

## 2022-01-08 NOTE — Telephone Encounter (Signed)
Requested Prescriptions  ?Pending Prescriptions Disp Refills  ?? traZODone (DESYREL) 50 MG tablet [Pharmacy Med Name: TRAZODONE 50 MG TABLET] 180 tablet 1  ?  Sig: TAKE 1-2 TABLETS BY MOUTH AT BEDTIME AS NEEDED FOR SLEEP.  ?  ? Psychiatry: Antidepressants - Serotonin Modulator Passed - 01/07/2022 12:34 PM  ?  ?  Passed - Completed PHQ-2 or PHQ-9 in the last 360 days  ?  ?  Passed - Valid encounter within last 6 months  ?  Recent Outpatient Visits   ?      ? 2 weeks ago Essential hypertension  ? [facility] Larae Grooms, NP  ? 1 month ago Anxiety  ? [facility] Larae Grooms, NP  ? 2 months ago Hypocalcemia  ? [facility] Larae Grooms, NP  ? 3 months ago Pre-op exam  ? [facility] Larae Grooms, NP  ? 4 months ago Atrial fibrillation, unspecified type [facility])  ? [facility] Larae Grooms, NP  ?  ?  ?Future Appointments   ?        ? In 3 weeks Larae Grooms, NP [facility], [facility]  ? In 4 months Larae Grooms, NP [facility], [facility]  ?  ? ?  ?  ?  ? ? ?

## 2022-01-14 ENCOUNTER — Other Ambulatory Visit: Payer: Self-pay | Admitting: Nurse Practitioner

## 2022-01-15 NOTE — Telephone Encounter (Signed)
Requested medication (s) are due for refill today: yes ? ?Requested medication (s) are on the active medication list: yes ? ?Last refill:  12/23/21 ? ?Future visit scheduled: yes ? ?Notes to clinic:  Unable to refill per protocol. Pharmacy is requesting 90 supply. ? ? ?  ?Requested Prescriptions  ?Pending Prescriptions Disp Refills  ? citalopram (CELEXA) 10 MG tablet [Pharmacy Med Name: CITALOPRAM HBR 10 MG TABLET] 90 tablet 1  ?  Sig: TAKE 1 TABLET BY MOUTH EVERY DAY  ?  ? Psychiatry:  Antidepressants - SSRI Passed - 01/14/2022 10:34 AM  ?  ?  Passed - Completed PHQ-2 or PHQ-9 in the last 360 days  ?  ?  Passed - Valid encounter within last 6 months  ?  Recent Outpatient Visits   ? ?      ? 3 weeks ago Essential hypertension  ? [facility] Larae Grooms, NP  ? 1 month ago Anxiety  ? [facility] Larae Grooms, NP  ? 2 months ago Hypocalcemia  ? [facility] Larae Grooms, NP  ? 3 months ago Pre-op exam  ? [facility] Larae Grooms, NP  ? 4 months ago Atrial fibrillation, unspecified type [facility])  ? [facility] Larae Grooms, NP  ? ?  ?  ?Future Appointments   ? ?        ? In 2 weeks Larae Grooms, NP [facility], [facility]  ? In 3 months End, Cristal Deer, MD [facility]  ? In 4 months Larae Grooms, NP [facility], [facility]  ? ?  ? ?  ?  ?  ? ? ?

## 2022-01-29 ENCOUNTER — Other Ambulatory Visit: Payer: Self-pay | Admitting: Nurse Practitioner

## 2022-01-29 NOTE — Telephone Encounter (Signed)
Requested medication (s) are due for refill today:   No ? ?Requested medication (s) are on the active medication list:   Yes ? ?Future visit scheduled:   Yes tomorrow Clovis Cao) with Clydie Braun ? ? ?Last ordered: 01/15/2022 #30, 0 refills ? ?Returned because a 90 day supply is being requested.  ? ?Requested Prescriptions  ?Pending Prescriptions Disp Refills  ? citalopram (CELEXA) 10 MG tablet [Pharmacy Med Name: CITALOPRAM HBR 10 MG TABLET] 90 tablet 1  ?  Sig: TAKE 1 TABLET BY MOUTH EVERY DAY  ?  ? Psychiatry:  Antidepressants - SSRI Passed - 01/29/2022  9:37 AM  ?  ?  Passed - Completed PHQ-2 or PHQ-9 in the last 360 days  ?  ?  Passed - Valid encounter within last 6 months  ?  Recent Outpatient Visits   ? ?      ? 1 month ago Essential hypertension  ? [facility] Larae Grooms, NP  ? 2 months ago Anxiety  ? [facility] Larae Grooms, NP  ? 3 months ago Hypocalcemia  ? [facility] Larae Grooms, NP  ? 4 months ago Pre-op exam  ? [facility] Larae Grooms, NP  ? 4 months ago Atrial fibrillation, unspecified type [facility])  ? [facility] Larae Grooms, NP  ? ?  ?  ?Future Appointments   ? ?        ? Tomorrow Larae Grooms, NP [facility], [facility]  ? In 2 months End, Cristal Deer, MD [facility]  ? In 3 months Larae Grooms, NP [facility], [facility]  ? ?  ? ? ?  ?  ?  ? ?

## 2022-01-30 ENCOUNTER — Encounter: Payer: Self-pay | Admitting: Nurse Practitioner

## 2022-01-30 ENCOUNTER — Ambulatory Visit (INDEPENDENT_AMBULATORY_CARE_PROVIDER_SITE_OTHER): Payer: Medicaid Other | Admitting: Nurse Practitioner

## 2022-01-30 VITALS — BP 126/80 | HR 62 | Temp 98.8°F | Wt 171.2 lb

## 2022-01-30 DIAGNOSIS — F3289 Other specified depressive episodes: Secondary | ICD-10-CM | POA: Diagnosis not present

## 2022-01-30 DIAGNOSIS — F419 Anxiety disorder, unspecified: Secondary | ICD-10-CM | POA: Diagnosis not present

## 2022-01-30 MED ORDER — CITALOPRAM HYDROBROMIDE 20 MG PO TABS: 20.0000 mg | ORAL_TABLET | Freq: Every day | ORAL | 0 refills | Status: DC

## 2022-01-30 NOTE — Assessment & Plan Note (Signed)
Chronic.  Improved but still not controlled.  Will increase Celexa to 20mg . Return to clinic in 1 months for reevaluation.  Call sooner if concerns arise.  ?

## 2022-01-30 NOTE — Progress Notes (Signed)
? ?BP 126/80   Pulse 62   Temp 98.8 ?F (37.1 ?C) (Oral)   Wt 171 lb 3.2 oz (77.7 kg)   SpO2 94%   BMI 27.63 kg/m?   ? ?Subjective:  ? ? Patient ID: Patrick Short, male    DOB: [birth year], 46 y.o.   MRN: [id number] ? ?HPI: ?Patrick Short is a 46 y.o. male ? ?Chief Complaint  ?Patient presents with  ? Depression  ? Anxiety  ? ?ANXIETY/DEPRESSION ?Patient states that he is mediocre.  Patient has a lot of stress with his Grandma passing.  States his temper has been really bad.  He does feel like the Celexa has helped some.  Denies SI.   ? ? ?[place] Office Visit from 01/30/2022 in [place]  ?PHQ-9 Total Score 3  ? ?  ? ? ?  01/30/2022  ?  2:08 PM 12/23/2021  ? 10:43 AM 11/18/2021  ?  3:25 PM 10/18/2021  ? 10:55 AM  ?GAD 7 : Generalized Anxiety Score  ?Nervous, Anxious, on Edge '2 2 1 1  ' ?Control/stop worrying 0 2 0 1  ?Worry too much - different things 0 2 0 0  ?Trouble relaxing '2 2 1 1  ' ?Restless '2 2 1 2  ' ?Easily annoyed or irritable '2 3 1 2  ' ?Afraid - awful might happen 0 0 0 1  ?Total GAD 7 Score '8 13 4 8  ' ?Anxiety Difficulty Very difficult Somewhat difficult Somewhat difficult Somewhat difficult  ? ? ? ?Relevant past medical, surgical, family and social history reviewed and updated as indicated. Interim medical history since our last visit reviewed. ?Allergies and medications reviewed and updated. ? ?Review of Systems  ?Eyes:  Negative for visual disturbance.  ?Respiratory:  Negative for shortness of breath.   ?Cardiovascular:  Negative for chest pain and leg swelling.  ?Neurological:  Negative for light-headedness and headaches.  ?     Restless leg  ?Psychiatric/Behavioral:  Positive for dysphoric mood. Negative for suicidal ideas. The patient is nervous/anxious.   ? ?Per HPI unless specifically indicated above ? ?   ?Objective:  ?  ?BP 126/80   Pulse 62   Temp 98.8 ?F (37.1 ?C) (Oral)   Wt 171 lb 3.2 oz (77.7 kg)   SpO2 94%   BMI 27.63 kg/m?   ?Wt Readings from Last 3 Encounters:  ?01/30/22 171  lb 3.2 oz (77.7 kg)  ?12/23/21 178 lb (80.7 kg)  ?11/18/21 174 lb 6.4 oz (79.1 kg)  ?  ?Physical Exam ?Vitals and nursing note reviewed.  ?Constitutional:   ?   General: He is not in acute distress. ?   Appearance: Normal appearance. He is not ill-appearing, toxic-appearing or diaphoretic.  ?HENT:  ?   Head: Normocephalic.  ?   Right Ear: External ear normal.  ?   Left Ear: External ear normal.  ?   Nose: Nose normal. No congestion or rhinorrhea.  ?   Mouth/Throat:  ?   Mouth: Mucous membranes are moist.  ?Eyes:  ?   General:     ?   Right eye: No discharge.     ?   Left eye: No discharge.  ?   Extraocular Movements: Extraocular movements intact.  ?   Conjunctiva/sclera: Conjunctivae normal.  ?   Pupils: Pupils are equal, round, and reactive to light.  ?Cardiovascular:  ?   Rate and Rhythm: Normal rate and regular rhythm.  ?   Heart sounds: No murmur heard. ?Pulmonary:  ?  Effort: Pulmonary effort is normal. No respiratory distress.  ?   Breath sounds: Normal breath sounds. No wheezing, rhonchi or rales.  ?Abdominal:  ?   General: Abdomen is flat. Bowel sounds are normal.  ?Musculoskeletal:  ?   Cervical back: Normal range of motion and neck supple.  ?Skin: ?   General: Skin is warm and dry.  ?   Capillary Refill: Capillary refill takes less than 2 seconds.  ?Neurological:  ?   General: No focal deficit present.  ?   Mental Status: He is alert and oriented to person, place, and time.  ?Psychiatric:     ?   Mood and Affect: Mood normal.     ?   Behavior: Behavior normal.     ?   Thought Content: Thought content normal.     ?   Judgment: Judgment normal.  ? ? ?Results for orders placed or performed in visit on 12/23/21  ?Comp Met (CMET)  ?Result Value Ref Range  ? Glucose 103 (H) 70 - 99 mg/dL  ? BUN 11 6 - 24 mg/dL  ? Creatinine, Ser 0.95 0.76 - 1.27 mg/dL  ? eGFR 100 >59 mL/min/1.73  ? BUN/Creatinine Ratio 12 9 - 20  ? Sodium 141 134 - 144 mmol/L  ? Potassium 4.2 3.5 - 5.2 mmol/L  ? Chloride 106 96 - 106 mmol/L  ?  CO2 22 20 - 29 mmol/L  ? Calcium 9.5 8.7 - 10.2 mg/dL  ? Total Protein 7.2 6.0 - 8.5 g/dL  ? Albumin 4.5 4.0 - 5.0 g/dL  ? Globulin, Total 2.7 1.5 - 4.5 g/dL  ? Albumin/Globulin Ratio 1.7 1.2 - 2.2  ? Bilirubin Total 0.2 0.0 - 1.2 mg/dL  ? Alkaline Phosphatase 63 44 - 121 IU/L  ? AST 17 0 - 40 IU/L  ? ALT 23 0 - 44 IU/L  ? ?   ?Assessment & Plan:  ? ?Problem List Items Addressed This Visit   ? ?  ? Other  ? Depression  ?  Chronic.  Improved but still not controlled.  Will increase Celexa to 55m. Return to clinic in 1 months for reevaluation.  Call sooner if concerns arise.  ? ?  ?  ? Relevant Medications  ? citalopram (CELEXA) 20 MG tablet  ? Anxiety - Primary  ?  Chronic.  Improved but still not controlled.  Will increase Celexa to 2104m Return to clinic in 1 months for reevaluation.  Call sooner if concerns arise.  ? ?  ?  ? Relevant Medications  ? citalopram (CELEXA) 20 MG tablet  ?  ? ?Follow up plan: ?Return in about 1 month (around 03/01/2022) for Depression/Anxiety FU. ? ? ? ? ? ? ?

## 2022-01-30 NOTE — Assessment & Plan Note (Signed)
Chronic.  Improved but still not controlled.  Will increase Celexa to 20mg. Return to clinic in 1 months for reevaluation.  Call sooner if concerns arise.  ?

## 2022-02-21 ENCOUNTER — Other Ambulatory Visit: Payer: Self-pay | Admitting: Nurse Practitioner

## 2022-02-24 NOTE — Telephone Encounter (Signed)
Requested medication (s) are due for refill today:   No ? ?Requested medication (s) are on the active medication list:   Yes ? ?Future visit scheduled:   Yes in 1 wk ? ? ?Last ordered: 01/30/2022 #30, 0 refills ? ?Returned because pharmacy requesting a 90 day supply.    ? ?Requested Prescriptions  ?Pending Prescriptions Disp Refills  ? citalopram (CELEXA) 20 MG tablet [Pharmacy Med Name: CITALOPRAM HBR 20 MG TABLET] 90 tablet 1  ?  Sig: TAKE 1 TABLET BY MOUTH EVERY DAY  ?  ? Psychiatry:  Antidepressants - SSRI Passed - 02/21/2022  8:31 AM  ?  ?  Passed - Completed PHQ-2 or PHQ-9 in the last 360 days  ?  ?  Passed - Valid encounter within last 6 months  ?  Recent Outpatient Visits   ? ?      ? 3 weeks ago Anxiety  ? [facility] Larae Grooms, NP  ? 2 months ago Essential hypertension  ? [facility] Larae Grooms, NP  ? 3 months ago Anxiety  ? [facility] Larae Grooms, NP  ? 4 months ago Hypocalcemia  ? [facility] Larae Grooms, NP  ? 5 months ago Pre-op exam  ? [facility] Larae Grooms, NP  ? ?  ?  ?Future Appointments   ? ?        ? In 1 week Larae Grooms, NP [facility], [facility]  ? In 1 month End, Cristal Deer, MD [facility]  ? In 2 months Larae Grooms, NP [facility], [facility]  ? ?  ? ? ?  ?  ?  ? ?

## 2022-02-25 ENCOUNTER — Other Ambulatory Visit: Payer: Self-pay | Admitting: Nurse Practitioner

## 2022-02-26 NOTE — Telephone Encounter (Signed)
Requested Prescriptions  ?Pending Prescriptions Disp Refills  ?? citalopram (CELEXA) 20 MG tablet [Pharmacy Med Name: CITALOPRAM HBR 20 MG TABLET] 30 tablet 0  ?  Sig: TAKE 1 TABLET BY MOUTH EVERY DAY  ?  ? Psychiatry:  Antidepressants - SSRI Passed - 02/25/2022 10:23 AM  ?  ?  Passed - Completed PHQ-2 or PHQ-9 in the last 360 days  ?  ?  Passed - Valid encounter within last 6 months  ?  Recent Outpatient Visits   ?      ? 3 weeks ago Anxiety  ? [facility] Larae Grooms, NP  ? 2 months ago Essential hypertension  ? [facility] Larae Grooms, NP  ? 3 months ago Anxiety  ? [facility] Larae Grooms, NP  ? 4 months ago Hypocalcemia  ? [facility] Larae Grooms, NP  ? 5 months ago Pre-op exam  ? [facility] Larae Grooms, NP  ?  ?  ?Future Appointments   ?        ? In 5 days Larae Grooms, NP [facility], [facility]  ? In 1 month End, Cristal Deer, MD [facility], [facility]  ? In 2 months Larae Grooms, NP [facility], [facility]  ?  ? ?  ?  ?  ? ? ?

## 2022-03-03 ENCOUNTER — Ambulatory Visit: Payer: Medicaid Other | Admitting: Nurse Practitioner

## 2022-03-03 NOTE — Progress Notes (Deleted)
There were no vitals taken for this visit.   Subjective:    Patient ID: Patrick Short, male    DOB: 1975-11-03, 46 y.o.   MRN: [id number]  HPI: Patrick Short is a 46 y.o. male  No chief complaint on file.  ANXIETY/DEPRESSION Patient states that he is mediocre.  Patient has a lot of stress with his Grandma passing.  States his temper has been really bad.  He does feel like the Celexa has helped some.  Denies SI.     [place] Office Visit from 01/30/2022 in [place]  PHQ-9 Total Score 3         01/30/2022    2:08 PM 12/23/2021   10:43 AM 11/18/2021    3:25 PM 10/18/2021   10:55 AM  GAD 7 : Generalized Anxiety Score  Nervous, Anxious, on Edge '2 2 1 1  ' Control/stop worrying 0 2 0 1  Worry too much - different things 0 2 0 0  Trouble relaxing '2 2 1 1  ' Restless '2 2 1 2  ' Easily annoyed or irritable '2 3 1 2  ' Afraid - awful might happen 0 0 0 1  Total GAD 7 Score '8 13 4 8  ' Anxiety Difficulty Very difficult Somewhat difficult Somewhat difficult Somewhat difficult     Relevant past medical, surgical, family and social history reviewed and updated as indicated. Interim medical history since our last visit reviewed. Allergies and medications reviewed and updated.  Review of Systems  Eyes:  Negative for visual disturbance.  Respiratory:  Negative for shortness of breath.   Cardiovascular:  Negative for chest pain and leg swelling.  Neurological:  Negative for light-headedness and headaches.       Restless leg  Psychiatric/Behavioral:  Positive for dysphoric mood. Negative for suicidal ideas. The patient is nervous/anxious.    Per HPI unless specifically indicated above     Objective:    There were no vitals taken for this visit.  Wt Readings from Last 3 Encounters:  01/30/22 171 lb 3.2 oz (77.7 kg)  12/23/21 178 lb (80.7 kg)  11/18/21 174 lb 6.4 oz (79.1 kg)    Physical Exam Vitals and nursing note reviewed.  Constitutional:      General: He is not in  acute distress.    Appearance: Normal appearance. He is not ill-appearing, toxic-appearing or diaphoretic.  HENT:     Head: Normocephalic.     Right Ear: External ear normal.     Left Ear: External ear normal.     Nose: Nose normal. No congestion or rhinorrhea.     Mouth/Throat:     Mouth: Mucous membranes are moist.  Eyes:     General:        Right eye: No discharge.        Left eye: No discharge.     Extraocular Movements: Extraocular movements intact.     Conjunctiva/sclera: Conjunctivae normal.     Pupils: Pupils are equal, round, and reactive to light.  Cardiovascular:     Rate and Rhythm: Normal rate and regular rhythm.     Heart sounds: No murmur heard. Pulmonary:     Effort: Pulmonary effort is normal. No respiratory distress.     Breath sounds: Normal breath sounds. No wheezing, rhonchi or rales.  Abdominal:     General: Abdomen is flat. Bowel sounds are normal.  Musculoskeletal:     Cervical back: Normal range of motion and neck supple.  Skin:  General: Skin is warm and dry.     Capillary Refill: Capillary refill takes less than 2 seconds.  Neurological:     General: No focal deficit present.     Mental Status: He is alert and oriented to person, place, and time.  Psychiatric:        Mood and Affect: Mood normal.        Behavior: Behavior normal.        Thought Content: Thought content normal.        Judgment: Judgment normal.    Results for orders placed or performed in visit on 12/23/21  Comp Met (CMET)  Result Value Ref Range   Glucose 103 (H) 70 - 99 mg/dL   BUN 11 6 - 24 mg/dL   Creatinine, Ser 0.95 0.76 - 1.27 mg/dL   eGFR 100 >59 mL/min/1.73   BUN/Creatinine Ratio 12 9 - 20   Sodium 141 134 - 144 mmol/L   Potassium 4.2 3.5 - 5.2 mmol/L   Chloride 106 96 - 106 mmol/L   CO2 22 20 - 29 mmol/L   Calcium 9.5 8.7 - 10.2 mg/dL   Total Protein 7.2 6.0 - 8.5 g/dL   Albumin 4.5 4.0 - 5.0 g/dL   Globulin, Total 2.7 1.5 - 4.5 g/dL   Albumin/Globulin Ratio  1.7 1.2 - 2.2   Bilirubin Total 0.2 0.0 - 1.2 mg/dL   Alkaline Phosphatase 63 44 - 121 IU/L   AST 17 0 - 40 IU/L   ALT 23 0 - 44 IU/L      Assessment & Plan:   Problem List Items Addressed This Visit       Other   Depression - Primary   Anxiety     Follow up plan: No follow-ups on file.

## 2022-03-26 ENCOUNTER — Other Ambulatory Visit: Payer: Self-pay | Admitting: Nurse Practitioner

## 2022-03-26 NOTE — Telephone Encounter (Signed)
Requested Prescriptions  Pending Prescriptions Disp Refills  . citalopram (CELEXA) 20 MG tablet [Pharmacy Med Name: CITALOPRAM HBR 20 MG TABLET] 90 tablet 1    Sig: TAKE 1 TABLET BY MOUTH EVERY DAY     Psychiatry:  Antidepressants - SSRI Passed - 03/26/2022  8:32 AM      Passed - Completed PHQ-2 or PHQ-9 in the last 360 days      Passed - Valid encounter within last 6 months    Recent Outpatient Visits          1 month ago [place], NP   3 months ago Essential hypertension   [place], NP   4 months ago [place], Karen, NP   5 months ago Hypocalcemia   [place], NP   6 months ago Pre-op exam   [facility] Jon Billings, NP      Future Appointments            In 4 weeks End, Harrell Gave, MD [facility], [place]   In 1 month Jon Billings, NP [facility], [place]   In 1 month Quentin Ore, Hilton Cork, MD [facility], [place]

## 2022-04-10 ENCOUNTER — Other Ambulatory Visit: Payer: Self-pay | Admitting: Internal Medicine

## 2022-04-23 ENCOUNTER — Ambulatory Visit (INDEPENDENT_AMBULATORY_CARE_PROVIDER_SITE_OTHER): Payer: Medicaid Other | Admitting: Internal Medicine

## 2022-04-23 ENCOUNTER — Encounter: Payer: Self-pay | Admitting: Internal Medicine

## 2022-04-23 VITALS — BP 126/80 | HR 78 | Ht 66.0 in | Wt 167.0 lb

## 2022-04-23 DIAGNOSIS — I429 Cardiomyopathy, unspecified: Secondary | ICD-10-CM | POA: Diagnosis not present

## 2022-04-23 DIAGNOSIS — I480 Paroxysmal atrial fibrillation: Secondary | ICD-10-CM

## 2022-04-23 NOTE — Patient Instructions (Signed)
Medication Instructions:   Your physician recommends that you continue on your current medications as directed. Please refer to the Current Medication list given to you today.  *If you need a refill on your cardiac medications before your next appointment, please call your pharmacy*   Lab Work:  None ordered   Testing/Procedures:  Your physician has requested that you have an echocardiogram. Echocardiography is a painless test that uses sound waves to create images of your heart. It provides your doctor with information about the size and shape of your heart and how well your heart's chambers and valves are working. This procedure takes approximately one hour. There are no restrictions for this procedure.   Follow-Up: At [facility], you and your health needs are our priority.  As part of our continuing mission to provide you with exceptional heart care, we have created designated Provider Care Teams.  These Care Teams include your primary Cardiologist (physician) and Advanced Practice Providers (APPs -  Physician Assistants and Nurse Practitioners) who all work together to provide you with the care you need, when you need it.  We recommend signing up for the patient portal called "MyChart".  Sign up information is provided on this After Visit Summary.  MyChart is used to connect with patients for Virtual Visits (Telemedicine).  Patients are able to view lab/test results, encounter notes, upcoming appointments, etc.  Non-urgent messages can be sent to your provider as well.   To learn more about what you can do with MyChart, go to [email or web address].    Your next appointment:   6 month(s)  The format for your next appointment:   In Person  Provider:   You may see Yvonne Kendall, MD or one of the following Advanced Practice Providers on your designated Care Team:   Nicolasa Ducking, NP Eula Listen, PA-C Cadence Fransico Michael, PA-C{   Important Information About Sugar
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
## 2022-04-29 ENCOUNTER — Other Ambulatory Visit: Payer: Self-pay | Admitting: Nurse Practitioner

## 2022-04-30 NOTE — Telephone Encounter (Signed)
Rx 11/18/21 #90 1RF- too soon Requested Prescriptions  Pending Prescriptions Disp Refills  . rOPINIRole (REQUIP) 1 MG tablet [Pharmacy Med Name: ROPINIROLE HCL 1 MG TABLET] 90 tablet 1    Sig: TAKE 1 TABLET BY MOUTH AT BEDTIME.     Neurology:  Parkinsonian Agents Passed - 04/29/2022 10:50 AM      Passed - Last BP in normal range    BP Readings from Last 1 Encounters:  04/23/22 126/80         Passed - Last Heart Rate in normal range    Pulse Readings from Last 1 Encounters:  04/23/22 78         Passed - Valid encounter within last 12 months    Recent Outpatient Visits          3 months ago Anxiety   [facility] Larae Grooms, NP   4 months ago Essential hypertension   [facility] Larae Grooms, NP   5 months ago Anxiety   [facility] Larae Grooms, NP   6 months ago Hypocalcemia   [facility] Larae Grooms, NP   7 months ago Pre-op exam   [facility] Larae Grooms, NP      Future Appointments            In 2 days Mecum, Oswaldo Conroy, PA-C [facility], [facility]   In 2 weeks Larae Grooms, NP [facility], [facility]   In 3 weeks Lanier Prude, MD [facility], [facility]

## 2022-05-02 ENCOUNTER — Encounter: Payer: Self-pay | Admitting: Physician Assistant

## 2022-05-02 ENCOUNTER — Ambulatory Visit (INDEPENDENT_AMBULATORY_CARE_PROVIDER_SITE_OTHER): Payer: Medicaid Other | Admitting: Physician Assistant

## 2022-05-02 ENCOUNTER — Ambulatory Visit
Admission: RE | Admit: 2022-05-02 | Discharge: 2022-05-02 | Disposition: A | Discharge status: Home / Self Care | Payer: Medicaid Other | Source: Ambulatory Visit | Attending: Physician Assistant | Admitting: Physician Assistant

## 2022-05-02 VITALS — BP 126/84 | HR 55 | Temp 97.9°F | Ht 65.98 in | Wt 162.1 lb

## 2022-05-02 DIAGNOSIS — F3289 Other specified depressive episodes: Secondary | ICD-10-CM | POA: Diagnosis not present

## 2022-05-02 DIAGNOSIS — R0981 Nasal congestion: Secondary | ICD-10-CM

## 2022-05-02 DIAGNOSIS — R062 Wheezing: Secondary | ICD-10-CM | POA: Diagnosis not present

## 2022-05-02 DIAGNOSIS — F419 Anxiety disorder, unspecified: Secondary | ICD-10-CM | POA: Diagnosis not present

## 2022-05-02 MED ORDER — PAROXETINE HCL 10 MG PO TABS: 10.0000 mg | ORAL_TABLET | Freq: Every day | ORAL | 0 refills | Status: DC

## 2022-05-02 MED ORDER — ALBUTEROL SULFATE HFA 108 (90 BASE) MCG/ACT IN AERS: 2.0000 | INHALATION_SPRAY | Freq: Four times a day (QID) | RESPIRATORY_TRACT | 0 refills | Status: DC | PRN

## 2022-05-02 NOTE — Assessment & Plan Note (Signed)
Chronic, improved but pt reports continued anxiety and irritability  He has been on Celexa 20 mg since April and reports minimal improvement in anxiety States he sees Neuropsych but is still struggling with PTSD-like symptoms States he would like to try alternative medication Discussed taper vs immediate switch from Celexa to Paxil and is amenable to immediate switch  Will start Paxil 10 mg PO QD - discussed potential for withdrawal and side effects from Celexa and he is aware to be on lookout Return precautions discussed  Recommend follow up in 4-6 weeks to discuss response to medication change

## 2022-05-02 NOTE — Patient Instructions (Addendum)
  Please go to the address below for your chest xray  [address], [place], [place] [address]  Based on the results of the chest xray, I will send in a script for an antibiotic to treat this  To help with your anxiety I recommend trying new medication I am going to call in a medication called Paroxetine 10 mg  Please stop the Celexa and start the Paroxetine the next day. You may have some withdrawal type symptoms with an abrupt switch but it is important to keep taking the Paxil every day.  Please come back to see [place] in 4-6 weeks to assess your response to this.    It was nice to meet you and I appreciate the opportunity to be involved in your care
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
## 2022-05-02 NOTE — Assessment & Plan Note (Signed)
Chronic, ongoing  Reports increased Celexa has helped him "not be in my emotions so much" but states he is still having increased anxiety and irritability Recommend changing agents to paxil 10 mg  Will attempt immediate switch and discussed potential side effects to expect during change-over Recommend follow up in 4-6 weeks for monitoring

## 2022-05-14 ENCOUNTER — Encounter: Payer: Self-pay | Admitting: Nurse Practitioner

## 2022-05-14 ENCOUNTER — Ambulatory Visit (INDEPENDENT_AMBULATORY_CARE_PROVIDER_SITE_OTHER): Payer: Medicaid Other | Admitting: Nurse Practitioner

## 2022-05-14 VITALS — BP 116/77 | HR 73 | Temp 97.7°F | Wt 167.7 lb

## 2022-05-14 DIAGNOSIS — F419 Anxiety disorder, unspecified: Secondary | ICD-10-CM

## 2022-05-14 MED ORDER — VENLAFAXINE HCL ER 37.5 MG PO CP24: 37.5000 mg | ORAL_CAPSULE | Freq: Every day | ORAL | 0 refills | Status: DC

## 2022-05-14 MED ORDER — BUSPIRONE HCL 5 MG PO TABS: 5.0000 mg | ORAL_TABLET | Freq: Two times a day (BID) | ORAL | 0 refills | Status: DC

## 2022-05-14 NOTE — Assessment & Plan Note (Signed)
Chronic. Not well controlled. Will change to Effexor and add Buspar PRN.  Side effects and benefits of medication discussed during visit.  Follow up in 1 week for reevaluation.  Call sooner if concerns arise.

## 2022-05-14 NOTE — Progress Notes (Signed)
BP 116/77   Pulse 73   Temp 97.7 F (36.5 C) (Oral)   Wt 167 lb 11.2 oz (76.1 kg)   SpO2 97%   BMI 27.08 kg/m    Subjective:    Patient ID: Patrick Short, male    DOB: 12/26/1975, 46 y.o.   MRN: [id number]  HPI: Patrick Short is a 46 y.o. male  Chief Complaint  Patient presents with   Anxiety    States his anxiety is not well controlled, he states his demeanor is not well    ANXIETY/DEPRESSION Patient states that he does not feel like the anxiety is well controlled.  He feels very angry and aggravated.  Does not feel like the paxil is helping at all.  Fiance says that it is difficult to have a conversation with him due to him getting angry.  Denies SI.    [place] Office Visit from 05/14/2022 in [place]  PHQ-9 Total Score 13         05/14/2022    3:06 PM 01/30/2022    2:08 PM 12/23/2021   10:43 AM 11/18/2021    3:25 PM  GAD 7 : Generalized Anxiety Score  Nervous, Anxious, on Edge '3 2 2 1  ' Control/stop worrying 0 0 2 0  Worry too much - different things 0 0 2 0  Trouble relaxing '3 2 2 1  ' Restless '3 2 2 1  ' Easily annoyed or irritable '3 2 3 1  ' Afraid - awful might happen 0 0 0 0  Total GAD 7 Score '12 8 13 4  ' Anxiety Difficulty Extremely difficult Very difficult Somewhat difficult Somewhat difficult     Relevant past medical, surgical, family and social history reviewed and updated as indicated. Interim medical history since our last visit reviewed. Allergies and medications reviewed and updated.  Review of Systems  Eyes:  Negative for visual disturbance.  Respiratory:  Negative for shortness of breath.   Cardiovascular:  Negative for chest pain and leg swelling.  Neurological:  Negative for light-headedness and headaches.       Restless leg  Psychiatric/Behavioral:  Positive for dysphoric mood. Negative for suicidal ideas. The patient is nervous/anxious.     Per HPI unless specifically indicated above     Objective:    BP 116/77   Pulse 73    Temp 97.7 F (36.5 C) (Oral)   Wt 167 lb 11.2 oz (76.1 kg)   SpO2 97%   BMI 27.08 kg/m   Wt Readings from Last 3 Encounters:  05/14/22 167 lb 11.2 oz (76.1 kg)  05/02/22 162 lb 1.6 oz (73.5 kg)  04/23/22 167 lb (75.8 kg)    Physical Exam Vitals and nursing note reviewed.  Constitutional:      General: He is not in acute distress.    Appearance: Normal appearance. He is not ill-appearing, toxic-appearing or diaphoretic.  HENT:     Head: Normocephalic.     Right Ear: External ear normal.     Left Ear: External ear normal.     Nose: Nose normal. No congestion or rhinorrhea.     Mouth/Throat:     Mouth: Mucous membranes are moist.  Eyes:     General:        Right eye: No discharge.        Left eye: No discharge.     Extraocular Movements: Extraocular movements intact.     Conjunctiva/sclera: Conjunctivae normal.     Pupils: Pupils are equal, round,  and reactive to light.  Cardiovascular:     Rate and Rhythm: Normal rate and regular rhythm.     Heart sounds: No murmur heard. Pulmonary:     Effort: Pulmonary effort is normal. No respiratory distress.     Breath sounds: Normal breath sounds. No wheezing, rhonchi or rales.  Abdominal:     General: Abdomen is flat. Bowel sounds are normal.  Musculoskeletal:     Cervical back: Normal range of motion and neck supple.  Skin:    General: Skin is warm and dry.     Capillary Refill: Capillary refill takes less than 2 seconds.  Neurological:     General: No focal deficit present.     Mental Status: He is alert and oriented to person, place, and time.  Psychiatric:        Mood and Affect: Mood normal.        Behavior: Behavior normal.        Thought Content: Thought content normal.        Judgment: Judgment normal.     Results for orders placed or performed in visit on 12/23/21  Comp Met (CMET)  Result Value Ref Range   Glucose 103 (H) 70 - 99 mg/dL   BUN 11 6 - 24 mg/dL   Creatinine, Ser 0.95 0.76 - 1.27 mg/dL   eGFR  100 >59 mL/min/1.73   BUN/Creatinine Ratio 12 9 - 20   Sodium 141 134 - 144 mmol/L   Potassium 4.2 3.5 - 5.2 mmol/L   Chloride 106 96 - 106 mmol/L   CO2 22 20 - 29 mmol/L   Calcium 9.5 8.7 - 10.2 mg/dL   Total Protein 7.2 6.0 - 8.5 g/dL   Albumin 4.5 4.0 - 5.0 g/dL   Globulin, Total 2.7 1.5 - 4.5 g/dL   Albumin/Globulin Ratio 1.7 1.2 - 2.2   Bilirubin Total 0.2 0.0 - 1.2 mg/dL   Alkaline Phosphatase 63 44 - 121 IU/L   AST 17 0 - 40 IU/L   ALT 23 0 - 44 IU/L      Assessment & Plan:   Problem List Items Addressed This Visit       Other   Anxiety - Primary    Chronic. Not well controlled. Will change to Effexor and add Buspar PRN.  Side effects and benefits of medication discussed during visit.  Follow up in 1 week for reevaluation.  Call sooner if concerns arise.       Relevant Medications   venlafaxine XR (EFFEXOR XR) 37.5 MG 24 hr capsule   busPIRone (BUSPAR) 5 MG tablet     Follow up plan: Return in about 1 week (around 05/21/2022) for Friday the 11 at 1pm.

## 2022-05-19 ENCOUNTER — Ambulatory Visit: Payer: Medicaid Other | Admitting: Nurse Practitioner

## 2022-05-21 ENCOUNTER — Other Ambulatory Visit: Payer: Self-pay | Admitting: Nurse Practitioner

## 2022-05-21 ENCOUNTER — Encounter: Payer: Self-pay | Admitting: Cardiology

## 2022-05-21 ENCOUNTER — Ambulatory Visit: Payer: Medicaid Other | Admitting: Cardiology

## 2022-05-21 VITALS — BP 102/80 | HR 56 | Ht 66.0 in | Wt 165.0 lb

## 2022-05-21 DIAGNOSIS — I10 Essential (primary) hypertension: Secondary | ICD-10-CM | POA: Diagnosis not present

## 2022-05-21 DIAGNOSIS — I5022 Chronic systolic (congestive) heart failure: Secondary | ICD-10-CM | POA: Diagnosis not present

## 2022-05-21 DIAGNOSIS — I4819 Other persistent atrial fibrillation: Secondary | ICD-10-CM

## 2022-05-21 NOTE — Telephone Encounter (Signed)
Requested medications are due for refill today.  Provider to decide  Requested medications are on the active medications list.  yes  Last refill. 05/14/2022 #30 0 refills  Future visit scheduled.   yes  Notes to clinic.  New medication to pt.  Pt given small supply and scheduled follow up appt.     Requested Prescriptions  Pending Prescriptions Disp Refills   busPIRone (BUSPAR) 5 MG tablet [Pharmacy Med Name: BUSPIRONE HCL 5 MG TABLET] 180 tablet 1    Sig: TAKE 1 TABLET BY MOUTH TWICE A DAY     Psychiatry: Anxiolytics/Hypnotics - Non-controlled Passed - 05/21/2022  2:31 PM      Passed - Valid encounter within last 12 months    Recent Outpatient Visits           1 week ago Anxiety   [facility] Larae Grooms, NP   2 weeks ago Wheezing on auscultation   [facility] Mecum, Oswaldo Conroy, PA-C   3 months ago Anxiety   [facility] Larae Grooms, NP   4 months ago Essential hypertension   [facility] Larae Grooms, NP   6 months ago Anxiety   [facility] Larae Grooms, NP       Future Appointments             In 2 days Larae Grooms, NP [facility]   In 6 months End, Cristal Deer, MD [id number]

## 2022-05-21 NOTE — Progress Notes (Signed)
Electrophysiology Office Follow up Visit Note:    Date:  05/21/2022   ID:  Patrick DUVA, DOB 17-Jul-1976, MRN [id number]  PCP:  Larae Grooms, NP  [facility] Cardiologist:  Yvonne Kendall, MD  [facility] Electrophysiologist:  Lanier Prude, MD    Interval History:    Patrick Short is a 46 y.o. male who presents for a follow up visit.  [name] had an A-fib ablation on October 21, 2021.  During that ablation, the veins, posterior wall and CTI were ablated.  He still [name] on November 18, 2021 and was doing well maintaining sinus rhythm.  He takes Eliquis for stroke prophylaxis.  He saw Dr. Okey Dupre April 23, 2022 in follow-up.  At that appointment he was also maintaining sinus rhythm.  He continues to report diaphoresis throughout the day requiring multiple showers per day. He has not felt any more atrial fibrillation.  He did have an episode of pneumonia that was treated with antibiotics and albuterol.      Past Medical History:  Diagnosis Date   Anxiety    Depression    Dysrhythmia    Hypertension    Mineral deficiency    Paroxysmal atrial fibrillation (HCC)    PTSD (post-traumatic stress disorder)     Past Surgical History:  Procedure Laterality Date   APPENDECTOMY     ATRIAL FIBRILLATION ABLATION N/A 10/21/2021   Procedure: ATRIAL FIBRILLATION ABLATION;  Surgeon: Lanier Prude, MD;  Location: [facility] INVASIVE CV LAB;  Service: Cardiovascular;  Laterality: N/A;   EYE SURGERY     laceration to pupil   FRACTURE SURGERY Left    Rod- which was replaced due to rejection of original rod   SPINAL FUSION     SPLENECTOMY, TOTAL     TEE WITHOUT CARDIOVERSION N/A 10/21/2021   Procedure: TRANSESOPHAGEAL ECHOCARDIOGRAM (TEE);  Surgeon: Lanier Prude, MD;  Location: [facility] INVASIVE CV LAB;  Service: Cardiovascular;  Laterality: N/A;    Current Medications: Current Meds  Medication Sig   albuterol (VENTOLIN HFA) 108 (90 Base) MCG/ACT inhaler Inhale 2 puffs into the lungs  every 6 (six) hours as needed for wheezing or shortness of breath.   busPIRone (BUSPAR) 5 MG tablet Take 1 tablet (5 mg total) by mouth 2 (two) times daily.   ELIQUIS 5 MG TABS tablet TAKE 1 TABLET BY MOUTH TWICE A DAY   metoprolol tartrate (LOPRESSOR) 25 MG tablet TAKE 1 TABLET BY MOUTH TWICE A DAY   rOPINIRole (REQUIP) 1 MG tablet Take 1 tablet (1 mg total) by mouth at bedtime.   traZODone (DESYREL) 50 MG tablet TAKE 1-2 TABLETS BY MOUTH AT BEDTIME AS NEEDED FOR SLEEP.   venlafaxine XR (EFFEXOR XR) 37.5 MG 24 hr capsule Take 1 capsule (37.5 mg total) by mouth daily with breakfast.     Allergies:   Aspirin, Bee venom, Sulfa antibiotics, and Penicillins   Social History   Socioeconomic History   Marital status: Single    Spouse name: Not on file   Number of children: Not on file   Years of education: Not on file   Highest education level: Not on file  Occupational History   Not on file  Tobacco Use   Smoking status: Former    Packs/day: 1.00    Years: 25.00    Total pack years: 25.00    Types: Cigarettes   Smokeless tobacco: Never   Tobacco comments:    Former smoker 11/18/21  Vaping Use   Vaping  Use: Never used  Substance and Sexual Activity   Alcohol use: Not Currently   Drug use: Yes    Frequency: 7.0 times per week    Types: Marijuana    Comment: daily   Sexual activity: Yes  Other Topics Concern   Not on file  Social History Narrative   Not on file   Social Determinants of Health   Financial Resource Strain: Not on file  Food Insecurity: Not on file  Transportation Needs: Not on file  Physical Activity: Not on file  Stress: Not on file  Social Connections: Not on file     Family History: The patient's family history includes Atrial fibrillation in his mother; Coronary artery disease in his paternal grandfather; Heart disease in his maternal grandmother; Hypertension in his father, maternal grandmother, mother, and paternal grandfather; Other in his  son.  ROS:   Please see the history of present illness.    All other systems reviewed and are negative.  EKGs/Labs/Other Studies Reviewed:    The following studies were reviewed today:  November 14, 2021 TEE EF 40% Normal RV No left atrial appendage thrombus   EKG:  The ekg ordered today demonstrates sinus rhythm with PVCs.  Recent Labs: 07/02/2021: TSH 0.593 10/18/2021: Magnesium 2.6 11/18/2021: Hemoglobin 15.4; Platelets 226 12/23/2021: ALT 23; BUN 11; Creatinine, Ser 0.95; Potassium 4.2; Sodium 141  Recent Lipid Panel    Component Value Date/Time   CHOL 197 09/23/2021 1036   TRIG 171 (H) 09/23/2021 1036   HDL 38 (L) 09/23/2021 1036   CHOLHDL 5.2 (H) 09/23/2021 1036   LDLCALC 128 (H) 09/23/2021 1036    Physical Exam:    VS:  BP 102/80 (BP Location: Left Arm, Patient Position: Sitting, Cuff Size: Normal)   Pulse (!) 56   Ht 5\' 6"  (1.676 m)   Wt 165 lb (74.8 kg)   SpO2 98%   BMI 26.63 kg/m     Wt Readings from Last 3 Encounters:  05/21/22 165 lb (74.8 kg)  05/14/22 167 lb 11.2 oz (76.1 kg)  05/02/22 162 lb 1.6 oz (73.5 kg)     GEN:  Well nourished, well developed in no acute distress HEENT: Normal NECK: No JVD; No carotid bruits LYMPHATICS: No lymphadenopathy CARDIAC: RRR, no murmurs, rubs, gallops RESPIRATORY:  Clear to auscultation without rales, wheezing or rhonchi  ABDOMEN: Soft, non-tender, non-distended MUSCULOSKELETAL:  No edema; No deformity  SKIN: Warm and dry NEUROLOGIC:  Alert and oriented x 3 PSYCHIATRIC:  Normal affect        ASSESSMENT:    1. Persistent atrial fibrillation (HCC)   2. Essential hypertension   3. Chronic systolic heart failure (HCC)    PLAN:    In order of problems listed above:   #Persistent atrial fibrillation Maintaining sinus rhythm after his ablation. Continue Eliquis for stroke prophylaxis  CHA2DS2-VASc Score = 2  The patient's score is based upon: CHF History: 1 HTN History: 1 Diabetes History: 0 Stroke  History: 0 Vascular Disease History: 0 Age Score: 0 Gender Score: 0  #Hypertension At goal today.  Recommend checking blood pressures 1-2 times per week at home and recording the values.  Recommend bringing these recordings to the primary care physician.  #Chronic systolic heart failure Likely tachycardia mediated.  No heart failure symptoms today.  NYHA class I.  Repeat echo scheduled for tomorrow.    Follow-up with Dr. 05/04/22 in 6 months. Follow-up with EP APP in 1 year.    Medication Adjustments/Labs and Tests Ordered:  Current medicines are reviewed at length with the patient today.  Concerns regarding medicines are outlined above.  No orders of the defined types were placed in this encounter.  No orders of the defined types were placed in this encounter.    Signed, Lars Mage, MD, [facility], [facility] 05/21/2022 9:44 AM    Electrophysiology [facility]

## 2022-05-21 NOTE — Patient Instructions (Signed)
Medication Instructions:  none *If you need a refill on your cardiac medications before your next appointment, please call your pharmacy*   Lab Work: none If you have labs (blood work) drawn today and your tests are completely normal, you will receive your results only by: MyChart Message (if you have MyChart) OR A paper copy in the mail If you have any lab test that is abnormal or we need to change your treatment, we will call you to review the results.   Testing/Procedures: none   Follow-Up: At [facility], you and your health needs are our priority.  As part of our continuing mission to provide you with exceptional heart care, we have created designated Provider Care Teams.  These Care Teams include your primary Cardiologist (physician) and Advanced Practice Providers (APPs -  Physician Assistants and Nurse Practitioners) who all work together to provide you with the care you need, when you need it.  We recommend signing up for the patient portal called "MyChart".  Sign up information is provided on this After Visit Summary.  MyChart is used to connect with patients for Virtual Visits (Telemedicine).  Patients are able to view lab/test results, encounter notes, upcoming appointments, etc.  Non-urgent messages can be sent to your provider as well.   To learn more about what you can do with MyChart, go to [email or web address].    Your next appointment:   6 month(s)  The format for your next appointment:   In Person  Provider:   Yvonne Kendall, MD{  1 yr back with EP    Important Information About Sugar

## 2022-05-22 ENCOUNTER — Ambulatory Visit (INDEPENDENT_AMBULATORY_CARE_PROVIDER_SITE_OTHER): Payer: Medicaid Other

## 2022-05-22 DIAGNOSIS — I5022 Chronic systolic (congestive) heart failure: Secondary | ICD-10-CM

## 2022-05-22 DIAGNOSIS — I429 Cardiomyopathy, unspecified: Secondary | ICD-10-CM

## 2022-05-22 LAB — ECHOCARDIOGRAM LIMITED
Calc EF: 53.5 %
S' Lateral: 3.6 cm
Single Plane A2C EF: 52.8 %
Single Plane A4C EF: 53.8 %

## 2022-05-23 ENCOUNTER — Encounter: Payer: Self-pay | Admitting: Nurse Practitioner

## 2022-05-23 ENCOUNTER — Ambulatory Visit: Payer: Medicaid Other | Admitting: Nurse Practitioner

## 2022-05-23 DIAGNOSIS — F419 Anxiety disorder, unspecified: Secondary | ICD-10-CM

## 2022-05-23 MED ORDER — BUSPIRONE HCL 10 MG PO TABS
10.0000 mg | ORAL_TABLET | Freq: Two times a day (BID) | ORAL | 0 refills | Status: DC
Start: 2022-05-23 — End: 2022-06-06

## 2022-05-23 NOTE — Progress Notes (Signed)
BP 107/66   Pulse (!) 54   Temp 98 F (36.7 C) (Oral)   Wt 165 lb 9.6 oz (75.1 kg)   SpO2 95%   BMI 26.73 kg/m    Subjective:    Patient ID: Patrick Short, male    DOB: 10-Jan-1976, 46 y.o.   MRN: [id number]  HPI: Patrick Short is a 46 y.o. male  Chief Complaint  Patient presents with   Hypertension    1 week follow up    ANXIETY/DEPRESSION Patient states he feels like the medication is helping.  He feels less angry and on edge.  Has taken the Buspar is helping.  Feels like it could be increased some.  Denies SI.    Flowsheet Row Office Visit from 05/23/2022 in [facility]  PHQ-9 Total Score 3         05/23/2022    1:09 PM 05/14/2022    3:06 PM 01/30/2022    2:08 PM 12/23/2021   10:43 AM  GAD 7 : Generalized Anxiety Score  Nervous, Anxious, on Edge 1 3 2 2   Control/stop worrying 0 0 0 2  Worry too much - different things 0 0 0 2  Trouble relaxing 1 3 2 2   Restless 1 3 2 2   Easily annoyed or irritable 1 3 2 3   Afraid - awful might happen 0 0 0 0  Total GAD 7 Score 4 12 8 13   Anxiety Difficulty Somewhat difficult Extremely difficult Very difficult Somewhat difficult     Relevant past medical, surgical, family and social history reviewed and updated as indicated. Interim medical history since our last visit reviewed. Allergies and medications reviewed and updated.  Review of Systems  Eyes:  Negative for visual disturbance.  Respiratory:  Negative for shortness of breath.   Cardiovascular:  Negative for chest pain and leg swelling.  Neurological:  Negative for light-headedness and headaches.       Restless leg  Psychiatric/Behavioral:  Positive for dysphoric mood. Negative for suicidal ideas. The patient is nervous/anxious.     Per HPI unless specifically indicated above     Objective:    BP 107/66   Pulse (!) 54   Temp 98 F (36.7 C) (Oral)   Wt 165 lb 9.6 oz (75.1 kg)   SpO2 95%   BMI 26.73 kg/m   Wt Readings from Last 3 Encounters:   05/23/22 165 lb 9.6 oz (75.1 kg)  05/21/22 165 lb (74.8 kg)  05/14/22 167 lb 11.2 oz (76.1 kg)    Physical Exam Vitals and nursing note reviewed.  Constitutional:      General: He is not in acute distress.    Appearance: Normal appearance. He is not ill-appearing, toxic-appearing or diaphoretic.  HENT:     Head: Normocephalic.     Right Ear: External ear normal.     Left Ear: External ear normal.     Nose: Nose normal. No congestion or rhinorrhea.     Mouth/Throat:     Mouth: Mucous membranes are moist.  Eyes:     General:        Right eye: No discharge.        Left eye: No discharge.     Extraocular Movements: Extraocular movements intact.     Conjunctiva/sclera: Conjunctivae normal.     Pupils: Pupils are equal, round, and reactive to light.  Cardiovascular:     Rate and Rhythm: Normal rate and regular rhythm.     Heart sounds: No  murmur heard. Pulmonary:     Effort: Pulmonary effort is normal. No respiratory distress.     Breath sounds: Normal breath sounds. No wheezing, rhonchi or rales.  Abdominal:     General: Abdomen is flat. Bowel sounds are normal.  Musculoskeletal:     Cervical back: Normal range of motion and neck supple.  Skin:    General: Skin is warm and dry.     Capillary Refill: Capillary refill takes less than 2 seconds.  Neurological:     General: No focal deficit present.     Mental Status: He is alert and oriented to person, place, and time.  Psychiatric:        Mood and Affect: Mood normal.        Behavior: Behavior normal.        Thought Content: Thought content normal.        Judgment: Judgment normal.     Results for orders placed or performed in visit on 05/22/22  ECHOCARDIOGRAM LIMITED  Result Value Ref Range   S' Lateral 3.60 cm   Single Plane A4C EF 53.8 %   Single Plane A2C EF 52.8 %   Calc EF 53.5 %      Assessment & Plan:   Problem List Items Addressed This Visit       Other   Anxiety    Chronic. Improved. Continue with  Effexor 37.5mg  daily.  Increase to 75mg  next Friday if tolerating medication well.  Will increase Buspar to 10mg  PRN.  Follow up in 2 weeks for reevaluation.  Call sooner if concerns arise.       Relevant Medications   busPIRone (BUSPAR) 10 MG tablet     Follow up plan: Return in about 2 weeks (around 06/06/2022) for Depression/Anxiety FU.

## 2022-05-23 NOTE — Assessment & Plan Note (Signed)
Chronic. Improved. Continue with Effexor 37.5mg  daily.  Increase to 75mg  next Friday if tolerating medication well.  Will increase Buspar to 10mg  PRN.  Follow up in 2 weeks for reevaluation.  Call sooner if concerns arise.

## 2022-05-24 ENCOUNTER — Other Ambulatory Visit: Payer: Self-pay | Admitting: Physician Assistant

## 2022-05-24 DIAGNOSIS — R062 Wheezing: Secondary | ICD-10-CM

## 2022-05-26 NOTE — Telephone Encounter (Signed)
Requested Prescriptions  Pending Prescriptions Disp Refills  . VENTOLIN HFA 108 (90 Base) MCG/ACT inhaler [Pharmacy Med Name: VENTOLIN HFA 90 MCG INHALER] 18 each 0    Sig: TAKE 2 PUFFS BY MOUTH EVERY 6 HOURS AS NEEDED FOR WHEEZE OR SHORTNESS OF BREATH     Pulmonology:  Beta Agonists 2 Passed - 05/24/2022 10:35 AM      Passed - Last BP in normal range    BP Readings from Last 1 Encounters:  05/23/22 107/66         Passed - Last Heart Rate in normal range    Pulse Readings from Last 1 Encounters:  05/23/22 (!) 54         Passed - Valid encounter within last 12 months    Recent Outpatient Visits          3 days ago Anxiety   [facility] Larae Grooms, NP   1 week ago Anxiety   [facility] Larae Grooms, NP   3 weeks ago Wheezing on auscultation   [facility] Mecum, Oswaldo Conroy, PA-C   3 months ago Anxiety   [facility] Larae Grooms, NP   5 months ago Essential hypertension   [facility] Larae Grooms, NP      Future Appointments            In 1 week Larae Grooms, NP [facility], [facility]   In 5 months End, Cristal Deer, MD [facility]

## 2022-05-30 ENCOUNTER — Other Ambulatory Visit: Payer: Self-pay | Admitting: Nurse Practitioner

## 2022-05-30 NOTE — Telephone Encounter (Signed)
Requested medication (s) are due for refill today: no  Requested medication (s) are on the active medication list: yes  Last refill:  05/23/22  Future visit scheduled: yes  Notes to clinic:  Unable to refill per protocol, last refill by provider 05/23/22. Pharmacy request 90 days supply.     Requested Prescriptions  Pending Prescriptions Disp Refills   busPIRone (BUSPAR) 10 MG tablet [Pharmacy Med Name: BUSPIRONE HCL 10 MG TABLET] 180 tablet 1    Sig: TAKE 1 TABLET BY MOUTH TWICE A DAY     Psychiatry: Anxiolytics/Hypnotics - Non-controlled Passed - 05/30/2022 10:32 AM      Passed - Valid encounter within last 12 months    Recent Outpatient Visits           1 week ago Anxiety   [facility] Larae Grooms, NP   2 weeks ago Anxiety   [facility] Larae Grooms, NP   4 weeks ago Wheezing on auscultation   [facility] Mecum, Oswaldo Conroy, PA-C   4 months ago Anxiety   [facility] Larae Grooms, NP   5 months ago Essential hypertension   [facility] Larae Grooms, NP       Future Appointments             In 1 week Larae Grooms, NP [facility], [facility]   In 5 months End, Cristal Deer, MD [facility], [facility]

## 2022-06-02 ENCOUNTER — Telehealth: Payer: Self-pay | Admitting: Nurse Practitioner

## 2022-06-02 MED ORDER — VENLAFAXINE HCL ER 75 MG PO CP24
75.0000 mg | ORAL_CAPSULE | Freq: Every day | ORAL | 0 refills | Status: DC
Start: 2022-06-02 — End: 2022-06-06

## 2022-06-02 NOTE — Telephone Encounter (Signed)
Pt was prescribed venlafaxine XR (EFFEXOR XR) 37.5 MG 24 hr capsule and last week pt was advised to start taking 2(double up) pt started doing so and now will run out early and has 1 pill left / pt needs a refill with new directions sent to [facility] [address] [place] - [address]  [address] [place], [place] [address]  Phone:  [phone]  Fax:  [phone]

## 2022-06-02 NOTE — Telephone Encounter (Signed)
Refill sent to the pharmacy 

## 2022-06-02 NOTE — Telephone Encounter (Signed)
Patient notified via telephone.

## 2022-06-05 NOTE — Progress Notes (Signed)
BP 124/82   Pulse 96   Temp 98.4 F (36.9 C) (Oral)   Wt 164 lb 14.4 oz (74.8 kg)   SpO2 97%   BMI 26.62 kg/m    Subjective:    Patient ID: Patrick Short, male    DOB: Mar 30, 1976, 46 y.o.   MRN: [id number]  HPI: Patrick Short is a 46 y.o. male  Chief Complaint  Patient presents with   Anxiety   Depression    2 week follow up - patient states medication is working well for him so far    Medication Refill     Patient would like new rx for epipens. States his are expired.    ANXIETY/DEPRESSION Patient states he feels like the medication is helping.  He likes the combination of the medications he is currently taking.  He feels less angry and on edge.  Has taken the Buspar is helping.  Denies SI.    Flowsheet Row Office Visit from 06/06/2022 in [facility]  PHQ-9 Total Score 1         06/06/2022   10:28 AM 05/23/2022    1:09 PM 05/14/2022    3:06 PM 01/30/2022    2:08 PM  GAD 7 : Generalized Anxiety Score  Nervous, Anxious, on Edge 1 1 3 2   Control/stop worrying 0 0 0 0  Worry too much - different things 0 0 0 0  Trouble relaxing 1 1 3 2   Restless 1 1 3 2   Easily annoyed or irritable 1 1 3 2   Afraid - awful might happen 0 0 0 0  Total GAD 7 Score 4 4 12 8   Anxiety Difficulty Somewhat difficult Somewhat difficult Extremely difficult Very difficult     Relevant past medical, surgical, family and social history reviewed and updated as indicated. Interim medical history since our last visit reviewed. Allergies and medications reviewed and updated.  Review of Systems  Eyes:  Negative for visual disturbance.  Respiratory:  Negative for shortness of breath.   Cardiovascular:  Negative for chest pain and leg swelling.  Neurological:  Negative for light-headedness and headaches.       Restless leg  Psychiatric/Behavioral:  Positive for dysphoric mood. Negative for suicidal ideas. The patient is nervous/anxious.     Per HPI unless specifically indicated  above     Objective:    BP 124/82   Pulse 96   Temp 98.4 F (36.9 C) (Oral)   Wt 164 lb 14.4 oz (74.8 kg)   SpO2 97%   BMI 26.62 kg/m   Wt Readings from Last 3 Encounters:  06/06/22 164 lb 14.4 oz (74.8 kg)  05/23/22 165 lb 9.6 oz (75.1 kg)  05/21/22 165 lb (74.8 kg)    Physical Exam Vitals and nursing note reviewed.  Constitutional:      General: He is not in acute distress.    Appearance: Normal appearance. He is not ill-appearing, toxic-appearing or diaphoretic.  HENT:     Head: Normocephalic.     Right Ear: External ear normal.     Left Ear: External ear normal.     Nose: Nose normal. No congestion or rhinorrhea.     Mouth/Throat:     Mouth: Mucous membranes are moist.  Eyes:     General:        Right eye: No discharge.        Left eye: No discharge.     Extraocular Movements: Extraocular movements intact.     Conjunctiva/sclera:  Conjunctivae normal.     Pupils: Pupils are equal, round, and reactive to light.  Cardiovascular:     Rate and Rhythm: Normal rate and regular rhythm.     Heart sounds: No murmur heard. Pulmonary:     Effort: Pulmonary effort is normal. No respiratory distress.     Breath sounds: Normal breath sounds. No wheezing, rhonchi or rales.  Abdominal:     General: Abdomen is flat. Bowel sounds are normal.  Musculoskeletal:     Cervical back: Normal range of motion and neck supple.  Skin:    General: Skin is warm and dry.     Capillary Refill: Capillary refill takes less than 2 seconds.  Neurological:     General: No focal deficit present.     Mental Status: He is alert and oriented to person, place, and time.  Psychiatric:        Mood and Affect: Mood normal.        Behavior: Behavior normal.        Thought Content: Thought content normal.        Judgment: Judgment normal.     Results for orders placed or performed in visit on 05/22/22  ECHOCARDIOGRAM LIMITED  Result Value Ref Range   S' Lateral 3.60 cm   Single Plane A4C EF 53.8  %   Single Plane A2C EF 52.8 %   Calc EF 53.5 %      Assessment & Plan:   Problem List Items Addressed This Visit       Other   Depression    Chronic. Controlled. Continue with Effexor 75mg  and Buspar BID.  Refills sent today.  Follow up in 3 months for reevaluation.  Call sooner if concerns arise.       Relevant Medications   venlafaxine XR (EFFEXOR XR) 75 MG 24 hr capsule   busPIRone (BUSPAR) 10 MG tablet   traZODone (DESYREL) 50 MG tablet   Anxiety - Primary    Chronic. Controlled. Continue with Effexor 75mg  and Buspar BID.  Refills sent today.  Follow up in 3 months for reevaluation.  Call sooner if concerns arise.       Relevant Medications   venlafaxine XR (EFFEXOR XR) 75 MG 24 hr capsule   busPIRone (BUSPAR) 10 MG tablet   traZODone (DESYREL) 50 MG tablet     Follow up plan: Return in about 3 months (around 09/06/2022) for HTN, HLD, DM2 FU.

## 2022-06-06 ENCOUNTER — Ambulatory Visit: Payer: Medicaid Other | Admitting: Nurse Practitioner

## 2022-06-06 ENCOUNTER — Encounter: Payer: Self-pay | Admitting: Nurse Practitioner

## 2022-06-06 VITALS — BP 124/82 | HR 96 | Temp 98.4°F | Wt 164.9 lb

## 2022-06-06 DIAGNOSIS — F419 Anxiety disorder, unspecified: Secondary | ICD-10-CM

## 2022-06-06 DIAGNOSIS — F3289 Other specified depressive episodes: Secondary | ICD-10-CM

## 2022-06-06 MED ORDER — EPINEPHRINE 0.3 MG/0.3ML IJ SOAJ: 0.3000 mg | INTRAMUSCULAR | 1 refills | Status: DC | PRN

## 2022-06-06 MED ORDER — TRAZODONE HCL 50 MG PO TABS: 50.0000 mg | ORAL_TABLET | Freq: Every evening | ORAL | 1 refills | Status: DC | PRN

## 2022-06-06 MED ORDER — BUSPIRONE HCL 10 MG PO TABS: 10.0000 mg | ORAL_TABLET | Freq: Two times a day (BID) | ORAL | 1 refills | Status: DC

## 2022-06-06 MED ORDER — APIXABAN 5 MG PO TABS: 5.0000 mg | ORAL_TABLET | Freq: Two times a day (BID) | ORAL | 1 refills | Status: DC

## 2022-06-06 MED ORDER — ROPINIROLE HCL 1 MG PO TABS
1.0000 mg | ORAL_TABLET | Freq: Every day | ORAL | 1 refills | Status: DC
Start: 2022-06-06 — End: 2022-09-08

## 2022-06-06 MED ORDER — VENLAFAXINE HCL ER 75 MG PO CP24
75.0000 mg | ORAL_CAPSULE | Freq: Every day | ORAL | 1 refills | Status: DC
Start: 2022-06-06 — End: 2022-09-08

## 2022-06-06 NOTE — Assessment & Plan Note (Signed)
Chronic. Controlled. Continue with Effexor 75mg  and Buspar BID.  Refills sent today.  Follow up in 3 months for reevaluation.  Call sooner if concerns arise.

## 2022-06-06 NOTE — Assessment & Plan Note (Signed)
Chronic. Controlled. Continue with Effexor 75mg and Buspar BID.  Refills sent today.  Follow up in 3 months for reevaluation.  Call sooner if concerns arise.  

## 2022-06-13 ENCOUNTER — Other Ambulatory Visit: Payer: Self-pay | Admitting: Physician Assistant

## 2022-06-13 DIAGNOSIS — F3289 Other specified depressive episodes: Secondary | ICD-10-CM

## 2022-06-13 DIAGNOSIS — F419 Anxiety disorder, unspecified: Secondary | ICD-10-CM

## 2022-06-17 NOTE — Telephone Encounter (Signed)
rx was changed to different medication by provider.  Requested Prescriptions  Pending Prescriptions Disp Refills  . PARoxetine (PAXIL) 10 MG tablet [Pharmacy Med Name: PAROXETINE HCL 10 MG TABLET] 30 tablet 0    Sig: TAKE 1 TABLET BY MOUTH EVERY DAY     Psychiatry:  Antidepressants - SSRI Passed - 06/13/2022  2:58 PM      Passed - Completed PHQ-2 or PHQ-9 in the last 360 days      Passed - Valid encounter within last 6 months    Recent Outpatient Visits          1 week ago Anxiety   [facility] Larae Grooms, NP   3 weeks ago Anxiety   [facility] Larae Grooms, NP   1 month ago Anxiety   [facility] Larae Grooms, NP   1 month ago Wheezing on auscultation   [address] [facility] Mecum, Oswaldo Conroy, PA-C   4 months ago Anxiety   [facility] Larae Grooms, NP      Future Appointments            In 2 months Larae Grooms, NP [facility], [facility]   In 5 months End, Cristal Deer, MD [facility] [place]

## 2022-06-23 ENCOUNTER — Encounter: Payer: Self-pay | Admitting: Cardiology

## 2022-06-23 MED ORDER — RIVAROXABAN 20 MG PO TABS: 20.0000 mg | ORAL_TABLET | Freq: Every day | ORAL | 11 refills | Status: DC

## 2022-06-24 NOTE — Telephone Encounter (Signed)
Pateint came by to pick up samples not at front desk

## 2022-07-03 ENCOUNTER — Other Ambulatory Visit: Payer: Self-pay | Admitting: Nurse Practitioner

## 2022-07-03 NOTE — Telephone Encounter (Signed)
Requested Prescriptions  Pending Prescriptions Disp Refills  . busPIRone (BUSPAR) 10 MG tablet [Pharmacy Med Name: BUSPIRONE HCL 10 MG TABLET] 180 tablet 0    Sig: TAKE 1 TABLET BY MOUTH TWICE A DAY     Psychiatry: Anxiolytics/Hypnotics - Non-controlled Passed - 07/03/2022  2:31 PM      Passed - Valid encounter within last 12 months    Recent Outpatient Visits          3 weeks ago [place] Jon Billings, NP   1 month ago [place], NP   1 month ago [place], NP   2 months ago Wheezing on auscultation   [facility] Mecum, Dani Gobble, PA-C   5 months ago [place], NP      Future Appointments            In 2 months Jon Billings, NP [facility], [place]   In 4 months End, Harrell Gave, MD [place] [place]

## 2022-07-08 DIAGNOSIS — M7711 Lateral epicondylitis, right elbow: Secondary | ICD-10-CM | POA: Diagnosis not present

## 2022-07-25 ENCOUNTER — Other Ambulatory Visit: Payer: Self-pay | Admitting: Internal Medicine

## 2022-08-07 DIAGNOSIS — M25521 Pain in right elbow: Secondary | ICD-10-CM | POA: Diagnosis not present

## 2022-08-14 ENCOUNTER — Emergency Department: Payer: Medicaid Other

## 2022-08-14 ENCOUNTER — Encounter: Payer: Self-pay | Admitting: Emergency Medicine

## 2022-08-14 ENCOUNTER — Other Ambulatory Visit: Payer: Self-pay

## 2022-08-14 ENCOUNTER — Emergency Department
Admission: EM | Admit: 2022-08-14 | Discharge: 2022-08-15 | Disposition: A | Discharge status: Home / Self Care | Payer: Medicaid Other | Attending: Emergency Medicine | Admitting: Emergency Medicine

## 2022-08-14 DIAGNOSIS — I480 Paroxysmal atrial fibrillation: Secondary | ICD-10-CM | POA: Diagnosis not present

## 2022-08-14 DIAGNOSIS — T7840XA Allergy, unspecified, initial encounter: Secondary | ICD-10-CM | POA: Diagnosis not present

## 2022-08-14 DIAGNOSIS — Z79899 Other long term (current) drug therapy: Secondary | ICD-10-CM | POA: Insufficient documentation

## 2022-08-14 DIAGNOSIS — I10 Essential (primary) hypertension: Secondary | ICD-10-CM | POA: Diagnosis not present

## 2022-08-14 DIAGNOSIS — I4891 Unspecified atrial fibrillation: Secondary | ICD-10-CM | POA: Diagnosis not present

## 2022-08-14 DIAGNOSIS — L500 Allergic urticaria: Secondary | ICD-10-CM | POA: Diagnosis not present

## 2022-08-14 DIAGNOSIS — L509 Urticaria, unspecified: Secondary | ICD-10-CM

## 2022-08-14 DIAGNOSIS — J9811 Atelectasis: Secondary | ICD-10-CM | POA: Diagnosis not present

## 2022-08-14 DIAGNOSIS — M25521 Pain in right elbow: Secondary | ICD-10-CM | POA: Diagnosis not present

## 2022-08-14 LAB — CBC WITH DIFFERENTIAL/PLATELET
Abs Immature Granulocytes: 0.05 10*3/uL (ref 0.00–0.07)
Basophils Absolute: 0.1 10*3/uL (ref 0.0–0.1)
Basophils Relative: 0 %
Eosinophils Absolute: 0.1 10*3/uL (ref 0.0–0.5)
Eosinophils Relative: 1 %
HCT: 53.6 % — ABNORMAL HIGH (ref 39.0–52.0)
Hemoglobin: 18.5 g/dL — ABNORMAL HIGH (ref 13.0–17.0)
Immature Granulocytes: 0 %
Lymphocytes Relative: 23 %
Lymphs Abs: 3.7 10*3/uL (ref 0.7–4.0)
MCH: 32.5 pg (ref 26.0–34.0)
MCHC: 34.5 g/dL (ref 30.0–36.0)
MCV: 94 fL (ref 80.0–100.0)
Monocytes Absolute: 1.3 10*3/uL — ABNORMAL HIGH (ref 0.1–1.0)
Monocytes Relative: 8 %
Neutro Abs: 11.1 10*3/uL — ABNORMAL HIGH (ref 1.7–7.7)
Neutrophils Relative %: 68 %
Platelets: 277 10*3/uL (ref 150–400)
RBC: 5.7 MIL/uL (ref 4.22–5.81)
RDW: 11.9 % (ref 11.5–15.5)
WBC: 16.4 10*3/uL — ABNORMAL HIGH (ref 4.0–10.5)
nRBC: 0 % (ref 0.0–0.2)

## 2022-08-14 MED ORDER — DIPHENHYDRAMINE HCL 50 MG/ML IJ SOLN
25.0000 mg | Freq: Once | INTRAMUSCULAR | Status: AC
  Administered 2022-08-14: 25 mg via INTRAVENOUS
  Filled 2022-08-14: qty 1

## 2022-08-14 MED ORDER — METHYLPREDNISOLONE SODIUM SUCC 125 MG IJ SOLR
125.0000 mg | Freq: Once | INTRAMUSCULAR | Status: AC
  Administered 2022-08-14: 125 mg via INTRAVENOUS
  Filled 2022-08-14: qty 2

## 2022-08-14 MED ORDER — DILTIAZEM HCL 25 MG/5ML IV SOLN
15.0000 mg | Freq: Once | INTRAVENOUS | Status: AC
  Administered 2022-08-14: 15 mg via INTRAVENOUS
  Filled 2022-08-14: qty 5

## 2022-08-14 MED ORDER — FAMOTIDINE IN NACL 20-0.9 MG/50ML-% IV SOLN
20.0000 mg | Freq: Once | INTRAVENOUS | Status: AC
  Administered 2022-08-14: 20 mg via INTRAVENOUS
  Filled 2022-08-14: qty 50

## 2022-08-14 MED ORDER — SODIUM CHLORIDE 0.9 % IV BOLUS
500.0000 mL | Freq: Once | INTRAVENOUS | Status: AC
  Administered 2022-08-14: 500 mL via INTRAVENOUS

## 2022-08-14 NOTE — ED Triage Notes (Signed)
Patient ambulatory to triage with steady gait, without difficulty or distress noted; pt reports generalized itchy rash tonight with no known cause; took 2 benadryl 39min PTA without relief

## 2022-08-14 NOTE — ED Provider Notes (Signed)
[facility] Provider Note    Event Date/Time   First MD Initiated Contact with Patient 08/14/22 2307     (approximate)   History   Allergic Reaction   HPI  Patrick Short is a 46 y.o. male presents to the ED with a chief complaint of allergic reaction.  Patient continues to have diffuse itching with hives around 10 PM; took 2 oral Benadryl prior to arrival.  Denies eating new foods, medicines including over-the-counters, environmental exposures, etc.  History of atrial fibrillation supposed to be taking Eliquis but self discontinued it 3 months ago.  States he does dangerous activities such as riding a motorcycle and stop the blood thinner so that he would not lead to death if he had an accident.  Endorses itching and palpitations.  Denies chest pain, shortness of breath, abdominal pain, vomiting or diarrhea.     Past Medical History   Past Medical History:  Diagnosis Date   Anxiety    Depression    Dysrhythmia    Hypertension    Mineral deficiency    Paroxysmal atrial fibrillation (HCC)    PTSD (post-traumatic stress disorder)      Active Problem List   Patient Active Problem List   Diagnosis Date Noted   Cardiomyopathy ([place]) 10/25/2021   Sleep disturbance 09/10/2021   Atrial fibrillation ([place]) 07/02/2021   PTSD (post-traumatic stress disorder) 03/20/2021   Chronic pain 03/20/2021   Elevated LDL cholesterol level 03/20/2021   Restless leg syndrome 01/28/2021   Depression 01/09/2021   Anxiety 01/09/2021   Essential hypertension 01/09/2021   Tobacco abuse 01/08/2021     Past Surgical History   Past Surgical History:  Procedure Laterality Date   APPENDECTOMY     ATRIAL FIBRILLATION ABLATION N/A 10/21/2021   Procedure: ATRIAL FIBRILLATION ABLATION;  Surgeon: Vickie Epley, MD;  Location: [place] [facility];  Service: Cardiovascular;  Laterality: N/A;   EYE SURGERY     laceration to pupil   FRACTURE SURGERY Left    Rod- which was  replaced due to rejection of original rod   SPINAL FUSION     SPLENECTOMY, TOTAL     TEE WITHOUT CARDIOVERSION N/A 10/21/2021   Procedure: TRANSESOPHAGEAL ECHOCARDIOGRAM (TEE);  Surgeon: Vickie Epley, MD;  Location: [place] [facility];  Service: Cardiovascular;  Laterality: N/A;     Home Medications   Prior to Admission medications   Medication Sig Start Date End Date Taking? Authorizing Provider  busPIRone (BUSPAR) 10 MG tablet TAKE 1 TABLET BY MOUTH TWICE A DAY 07/03/22  Yes Jon Billings, NP  metoprolol tartrate (LOPRESSOR) 25 MG tablet TAKE 1 TABLET BY MOUTH TWICE A DAY 07/25/22  Yes End, Harrell Gave, MD  rOPINIRole (REQUIP) 1 MG tablet Take 1 tablet (1 mg total) by mouth at bedtime. 06/06/22  Yes Jon Billings, NP  venlafaxine XR (EFFEXOR XR) 75 MG 24 hr capsule Take 1 capsule (75 mg total) by mouth daily with breakfast. 06/06/22  Yes Jon Billings, NP  EPINEPHrine 0.3 mg/0.3 mL IJ SOAJ injection Inject 0.3 mg into the muscle as needed for anaphylaxis. 06/06/22   Jon Billings, NP  traZODone (DESYREL) 50 MG tablet Take 1-2 tablets (50-100 mg total) by mouth at bedtime as needed. for sleep 06/06/22   Jon Billings, NP  VENTOLIN HFA 108 (90 Base) MCG/ACT inhaler TAKE 2 PUFFS BY MOUTH EVERY 6 HOURS AS NEEDED FOR WHEEZE OR SHORTNESS OF BREATH 05/26/22   Jon Billings, NP     Allergies  Aspirin, Bee venom, Sulfa antibiotics, and Penicillins   Family History   Family History  Problem Relation Age of Onset   Hypertension Mother    Atrial fibrillation Mother    Hypertension Father    Heart disease Maternal Grandmother    Hypertension Maternal Grandmother    Hypertension Paternal Grandfather    Coronary artery disease Paternal Grandfather    Other Son        Trisomy 84     Physical Exam  Triage Vital Signs: ED Triage Vitals  Enc Vitals Group     BP 08/14/22 2258 (!) 140/90     Pulse Rate 08/14/22 2258 (!) 206     Resp 08/14/22 2258 20     Temp  08/14/22 2258 (!) 97.5 F (36.4 C)     Temp Source 08/14/22 2258 Oral     SpO2 08/14/22 2258 96 %     Weight 08/14/22 2259 160 lb (72.6 kg)     Height 08/14/22 2259 5\' 7"  (1.702 m)     Head Circumference --      Peak Flow --      Pain Score 08/14/22 2259 0     Pain Loc --      Pain Edu? --      Excl. in [place]? --     Updated Vital Signs: BP 109/85   Pulse 80   Temp (!) 97.5 F (36.4 C) (Oral)   Resp 20   Ht 5\' 7"  (1.702 m)   Wt 72.6 kg   SpO2 96%   BMI 25.06 kg/m    General: Awake, mild distress.  CV:  Irregularly irregular rate good peripheral perfusion.  Resp:  Normal effort.  CTA B. Abd:  Nontender.  No distention.  Other:  No facial, tongue or lip angioedema.  Phonation intact.  There is no hoarse voice.  Tolerating secretions well.  No submental or neck swelling.   ED Results / Procedures / Treatments  Labs (all labs ordered are listed, but only abnormal results are displayed) Labs Reviewed  CBC WITH DIFFERENTIAL/PLATELET - Abnormal; Notable for the following components:      Result Value   WBC 16.4 (*)    Hemoglobin 18.5 (*)    HCT 53.6 (*)    Neutro Abs 11.1 (*)    Monocytes Absolute 1.3 (*)    All other components within normal limits  COMPREHENSIVE METABOLIC PANEL - Abnormal; Notable for the following components:   Potassium 3.3 (*)    Glucose, Bld 124 (*)    Creatinine, Ser 1.28 (*)    Total Protein 8.2 (*)    All other components within normal limits     EKG  ED ECG REPORT I, Usher Hedberg J, the attending physician, personally viewed and interpreted this ECG.   Date: 08/14/2022  EKG Time: 2302  Rate: 167  Rhythm: atrial fibrillation, rate 167  Axis: Normal  Intervals:none  ST&T Change: Nonspecific  ED ECG REPORT I, Niasha Devins J, the attending physician, personally viewed and interpreted this ECG.   Date: 08/14/2022  EKG Time: 2349  Rate: 79  Rhythm: atrial fibrillation, rate 79  Axis: Normal  Intervals:none  ST&T Change:  Nonspecific     RADIOLOGY I have independently visualized and interpreted patient's chest x-ray as well as the radiology interpretation:  X-ray: Minimal atelectasis  Official radiology report(s): DG Chest Port 1 View  Result Date: 08/14/2022 CLINICAL DATA:  Allergic reaction EXAM: PORTABLE CHEST 1 VIEW COMPARISON:  Chest x-ray 05/02/2022 FINDINGS: The heart  size and mediastinal contours are within normal limits. There is minimal atelectasis in the lung bases. There is no pleural effusion or pneumothorax. The visualized skeletal structures are unremarkable. IMPRESSION: Minimal atelectasis in the lung bases. Electronically Signed   By: Ronney Asters M.D.   On: 08/14/2022 23:36     PROCEDURES:  Critical Care performed: Yes, see critical care procedure note(s)  CRITICAL CARE Performed by: Paulette Blanch   Total critical care time: 30 minutes  Critical care time was exclusive of separately billable procedures and treating other patients.  Critical care was necessary to treat or prevent imminent or life-threatening deterioration.  Critical care was time spent personally by me on the following activities: development of treatment plan with patient and/or surrogate as well as nursing, discussions with consultants, evaluation of patient's response to treatment, examination of patient, obtaining history from patient or surrogate, ordering and performing treatments and interventions, ordering and review of laboratory studies, ordering and review of radiographic studies, pulse oximetry and re-evaluation of patient's condition.   Marland Kitchen1-3 Lead EKG Interpretation  Performed by: Paulette Blanch, MD Authorized by: Paulette Blanch, MD     Interpretation: abnormal     ECG rate:  167   ECG rate assessment: tachycardic     Rhythm: atrial fibrillation     Ectopy: none     Conduction: normal   Comments:     Patient placed on cardiac monitor to evaluate for arrhythmias    MEDICATIONS ORDERED IN  ED: Medications  sodium chloride 0.9 % bolus 500 mL (0 mLs Intravenous Stopped 08/15/22 0006)  diphenhydrAMINE (BENADRYL) injection 25 mg (25 mg Intravenous Given 08/14/22 2324)  methylPREDNISolone sodium succinate (SOLU-MEDROL) 125 mg/2 mL injection 125 mg (125 mg Intravenous Given 08/14/22 2322)  famotidine (PEPCID) IVPB 20 mg premix (0 mg Intravenous Stopped 08/14/22 2359)  diltiazem (CARDIZEM) injection 15 mg (15 mg Intravenous Given 08/14/22 2321)     IMPRESSION / MDM / ASSESSMENT AND PLAN / ED COURSE  I reviewed the triage vital signs and the nursing notes.                             46 year old male presenting with allergic reaction with urticaria.  Also in atrial fibrillation with rapid ventricular rate, not anticoagulated.  Will administer allergic reaction cocktail to include IV fluids, Benadryl Solu-Medrol and Pepcid.  Hold EpiPen for now as patient does not have angioedema.  Administer 15 mg Cardizem bolus.  Will reassess.  Patient's presentation is most consistent with acute presentation with potential threat to life or bodily function.  The patient is on the cardiac monitor to evaluate for evidence of arrhythmia and/or significant heart rate changes.   Clinical Course as of 08/15/22 0224  Thu Aug 14, 2022  2357 Heart rate improved after IV Cardizem.  Heart rate in the 70s, atrial fibrillation. [JS]  Fri Aug 15, 2022  0008 Hives improved.  Posterior oropharynx remains clear.  RA saturation 96%..  We will continue to monitor and care for patient. [JS]  0350 Heart rate remains controlled.  Hives resolved.  Posterior oropharynx clear.  Will discharge home Prednisone and Pepcid.  Patient has EpiPen.  Will refer to ENT for outpatient allergy testing.  Strict return precautions given.  Patient verbalizes understanding and agrees with plan of care. [JS]    Clinical Course User Index [JS] Paulette Blanch, MD     FINAL CLINICAL IMPRESSION(S) / ED DIAGNOSES  Final diagnoses:   Allergic reaction, initial encounter  Urticaria  Atrial fibrillation with rapid ventricular response ([place])     Rx / DC Orders   ED Discharge Orders     None        Note:  This document was prepared using Dragon voice recognition software and may include unintentional dictation errors.   Paulette Blanch, MD 08/15/22 [phone]

## 2022-08-15 ENCOUNTER — Telehealth: Payer: Self-pay | Admitting: Cardiology

## 2022-08-15 LAB — COMPREHENSIVE METABOLIC PANEL
ALT: 21 U/L (ref 0–44)
AST: 31 U/L (ref 15–41)
Albumin: 4.5 g/dL (ref 3.5–5.0)
Alkaline Phosphatase: 81 U/L (ref 38–126)
Anion gap: 13 (ref 5–15)
BUN: 18 mg/dL (ref 6–20)
CO2: 24 mmol/L (ref 22–32)
Calcium: 9.8 mg/dL (ref 8.9–10.3)
Chloride: 103 mmol/L (ref 98–111)
Creatinine, Ser: 1.28 mg/dL — ABNORMAL HIGH (ref 0.61–1.24)
GFR, Estimated: >60 mL/min (ref >60)
Glucose, Bld: 124 mg/dL — ABNORMAL HIGH (ref 70–99)
Potassium: 3.3 mmol/L — ABNORMAL LOW (ref 3.5–5.1)
Sodium: 140 mmol/L (ref 135–145)
Total Bilirubin: 0.5 mg/dL (ref 0.3–1.2)
Total Protein: 8.2 g/dL — ABNORMAL HIGH (ref 6.5–8.1)

## 2022-08-15 MED ORDER — PREDNISONE 20 MG PO TABS: ORAL_TABLET | ORAL | 0 refills | Status: DC

## 2022-08-15 MED ORDER — FAMOTIDINE 20 MG PO TABS: 20.0000 mg | ORAL_TABLET | Freq: Two times a day (BID) | ORAL | 0 refills | Status: DC

## 2022-08-15 NOTE — Telephone Encounter (Signed)
Pt wanted to inform Dr. Quentin Ore after ED visit last night that he was in afib the whole time.

## 2022-08-15 NOTE — ED Notes (Signed)
Patient provided with pillow per request.   Patient resting in bed free from sign of distress. Breathing unlabored speaking in full sentences with symmetric chest rise and fall. Bed low and locked with side rails raised x2. Call bell in reach and monitor in place.

## 2022-08-15 NOTE — Telephone Encounter (Signed)
Pt has hosp f/u appt w/ Dr. Saunders Revel 08/21/22.

## 2022-08-15 NOTE — Discharge Instructions (Signed)
1. Take the following medicines for the next 4 days: Prednisone 60mg daily Pepcid 20mg twice daily 2. Take Benadryl as needed for itching. 3. Use Epi-Pen in case of acute, life-threatening allergic reaction. 4. Return to the ER for worsening symptoms, persistent vomiting, difficulty breathing or other concerns.  

## 2022-08-15 NOTE — ED Notes (Signed)
Patient discharged at this time. Ambulated to lobby with independent and steady gait. Breathing unlabored speaking in full sentences. Verbalized understanding of all discharge, follow up, and medication teaching. Discharged homed with all belongings.   

## 2022-08-18 ENCOUNTER — Encounter: Payer: Self-pay | Admitting: Internal Medicine

## 2022-08-18 ENCOUNTER — Encounter: Payer: Self-pay | Admitting: Medical

## 2022-08-18 ENCOUNTER — Encounter: Payer: Self-pay | Admitting: Nurse Practitioner

## 2022-08-18 ENCOUNTER — Encounter: Payer: Self-pay | Admitting: Cardiology

## 2022-08-18 ENCOUNTER — Ambulatory Visit: Payer: Medicaid Other | Attending: Medical | Admitting: Medical

## 2022-08-18 VITALS — BP 132/82 | HR 94 | Ht 66.0 in | Wt 167.8 lb

## 2022-08-18 DIAGNOSIS — I5022 Chronic systolic (congestive) heart failure: Secondary | ICD-10-CM

## 2022-08-18 DIAGNOSIS — Z79899 Other long term (current) drug therapy: Secondary | ICD-10-CM

## 2022-08-18 DIAGNOSIS — E876 Hypokalemia: Secondary | ICD-10-CM | POA: Diagnosis not present

## 2022-08-18 DIAGNOSIS — I429 Cardiomyopathy, unspecified: Secondary | ICD-10-CM | POA: Diagnosis not present

## 2022-08-18 DIAGNOSIS — I480 Paroxysmal atrial fibrillation: Secondary | ICD-10-CM

## 2022-08-18 DIAGNOSIS — I10 Essential (primary) hypertension: Secondary | ICD-10-CM | POA: Diagnosis not present

## 2022-08-18 MED ORDER — METOPROLOL TARTRATE 50 MG PO TABS: 50.0000 mg | ORAL_TABLET | Freq: Two times a day (BID) | ORAL | 3 refills | Status: DC

## 2022-08-18 MED ORDER — SPIRONOLACTONE 25 MG PO TABS: 12.5000 mg | ORAL_TABLET | Freq: Every day | ORAL | 6 refills | Status: DC

## 2022-08-18 NOTE — Telephone Encounter (Signed)
Pt scheduled Wednesday.  °

## 2022-08-18 NOTE — Progress Notes (Signed)
Cardiology Office Note:    Date:  08/18/2022   ID:  Patrick Short, DOB 30-Aug-1976, MRN [id number]  PCP:  Jon Billings, NP  [facility] Cardiologist:  Nelva Bush, MD  [facility] Electrophysiologist:  Vickie Epley, MD   Referring MD: Jon Billings, NP   Chief Complaint: Afib  History of Present Illness:    Patrick Short is a 46 y.o. male with a hx of paroxysmal Afib s/p ablation, cardiomyopathy LVEF 40-45%, HTN, anxiety, depression and PTSD who presents for afib follow-up.   The patient underwent afib ablation 10/2021. TEE at that time showed mildly reduced LVEF 40-45%.   Last seen 04/23/22 and reported significant diaphoresis. Repeat limited echo was ordered.   He saw EP 05/21/22 who recommended Eliquis for stroke ppx. CHADSVASC of 2. Limited echo 05/2022 showed LVEF 55-60%.   Patient had an ER visit 08/14/22 for an allergic reaction and afib. He is unsure what the reaction was to since there were no changes to food or medications. He didn't use an Epipen because his heart rate was high. EKG showed afib with rates up to 200s. He was given cardizem injection 15mg  with conversion to NSR. Labs showed hypokalemia and elevated Scr. He was re-started on Eliquis. He was given Prednisone and Pepcid.   Today, EKG shows NSR with frequent PVCs. This is the first time he has been back in afib since the ablation. He is taking Eliquis 5mg  BID. Since the ER visit, he feels he is going in and out of afib. He is taking Lopressor 25mg  BID. PCP will re-check BMET tomorrow. He reports chest soreness, worse with inspiration.  Past Medical History:  Diagnosis Date   Anxiety    Depression    Dysrhythmia    Hypertension    Mineral deficiency    Paroxysmal atrial fibrillation (HCC)    PTSD (post-traumatic stress disorder)     Past Surgical History:  Procedure Laterality Date   APPENDECTOMY     ATRIAL FIBRILLATION ABLATION N/A 10/21/2021   Procedure: ATRIAL FIBRILLATION ABLATION;   Surgeon: Vickie Epley, MD;  Location: [place] [facility];  Service: Cardiovascular;  Laterality: N/A;   EYE SURGERY     laceration to pupil   FRACTURE SURGERY Left    Rod- which was replaced due to rejection of original rod   SPINAL FUSION     SPLENECTOMY, TOTAL     TEE WITHOUT CARDIOVERSION N/A 10/21/2021   Procedure: TRANSESOPHAGEAL ECHOCARDIOGRAM (TEE);  Surgeon: Vickie Epley, MD;  Location: [place] [facility];  Service: Cardiovascular;  Laterality: N/A;    Current Medications: Current Meds  Medication Sig   busPIRone (BUSPAR) 10 MG tablet TAKE 1 TABLET BY MOUTH TWICE A DAY   EPINEPHrine 0.3 mg/0.3 mL IJ SOAJ injection Inject 0.3 mg into the muscle as needed for anaphylaxis.   famotidine (PEPCID) 20 MG tablet Take 1 tablet (20 mg total) by mouth 2 (two) times daily.   metoprolol tartrate (LOPRESSOR) 25 MG tablet TAKE 1 TABLET BY MOUTH TWICE A DAY   predniSONE (DELTASONE) 20 MG tablet 3 tablets daily x 4 days   rOPINIRole (REQUIP) 1 MG tablet Take 1 tablet (1 mg total) by mouth at bedtime.   traZODone (DESYREL) 50 MG tablet Take 1-2 tablets (50-100 mg total) by mouth at bedtime as needed. for sleep   venlafaxine XR (EFFEXOR XR) 75 MG 24 hr capsule Take 1 capsule (75 mg total) by mouth daily with breakfast.   VENTOLIN HFA 108 (  90 Base) MCG/ACT inhaler TAKE 2 PUFFS BY MOUTH EVERY 6 HOURS AS NEEDED FOR WHEEZE OR SHORTNESS OF BREATH     Allergies:   Aspirin, Bee venom, Sulfa antibiotics, and Penicillins   Social History   Socioeconomic History   Marital status: Single    Spouse name: Not on file   Number of children: Not on file   Years of education: Not on file   Highest education level: Not on file  Occupational History   Not on file  Tobacco Use   Smoking status: Former    Packs/day: 1.00    Years: 25.00    Total pack years: 25.00    Types: Cigarettes   Smokeless tobacco: Never   Tobacco comments:    Former smoker 11/18/21  Vaping Use   Vaping Use: Never used   Substance and Sexual Activity   Alcohol use: Not Currently   Drug use: Yes    Frequency: 7.0 times per week    Types: Marijuana    Comment: daily   Sexual activity: Yes  Other Topics Concern   Not on file  Social History Narrative   Not on file   Social Determinants of Health   Financial Resource Strain: Not on file  Food Insecurity: Not on file  Transportation Needs: Not on file  Physical Activity: Not on file  Stress: Not on file  Social Connections: Not on file     Family History: The patient's family history includes Atrial fibrillation in his mother; Coronary artery disease in his paternal grandfather; Heart disease in his maternal grandmother; Hypertension in his father, maternal grandmother, mother, and paternal grandfather; Other in his son.  ROS:   Please see the history of present illness.     All other systems reviewed and are negative.  EKGs/Labs/Other Studies Reviewed:    The following studies were reviewed today:  Echo limited 05/2022   1. Left ventricular ejection fraction, by estimation, is 55 to 60%. The  left ventricle has normal function. The left ventricle has no regional  wall motion abnormalities.   2. Right ventricular systolic function is normal. The right ventricular  size is normal.   3. The mitral valve is normal in structure. No evidence of mitral valve  regurgitation.   4. The aortic valve is tricuspid. Aortic valve regurgitation is not  visualized.   EKG:  EKG is ordered today.  The ekg ordered today demonstrates NSR 94bpm, frequent PVCs, 94bpm, nonspecific T wave changes  Recent Labs: 10/18/2021: Magnesium 2.6 08/14/2022: ALT 21; BUN 18; Creatinine, Ser 1.28; Hemoglobin 18.5; Platelets 277; Potassium 3.3; Sodium 140  Recent Lipid Panel    Component Value Date/Time   CHOL 197 09/23/2021 1036   TRIG 171 (H) 09/23/2021 1036   HDL 38 (L) 09/23/2021 1036   CHOLHDL 5.2 (H) 09/23/2021 1036   LDLCALC 128 (H) 09/23/2021 1036   Physical  Exam:    VS:  BP 132/82 (BP Location: Left Arm, Patient Position: Sitting, Cuff Size: Normal)   Pulse 94   Ht 5\' 6"  (1.676 m)   Wt 167 lb 12.8 oz (76.1 kg)   SpO2 98%   BMI 27.08 kg/m     Wt Readings from Last 3 Encounters:  08/18/22 167 lb 12.8 oz (76.1 kg)  08/14/22 160 lb (72.6 kg)  06/06/22 164 lb 14.4 oz (74.8 kg)     GEN:  Well nourished, well developed in no acute distress HEENT: Normal NECK: No JVD; No carotid bruits LYMPHATICS: No lymphadenopathy CARDIAC:  RRR, no murmurs, rubs, gallops RESPIRATORY:  Clear to auscultation without rales, wheezing or rhonchi  ABDOMEN: Soft, non-tender, non-distended MUSCULOSKELETAL:  No edema; No deformity  SKIN: Warm and dry NEUROLOGIC:  Alert and oriented x 3 PSYCHIATRIC:  Normal affect   ASSESSMENT:    1. Paroxysmal A-fib ([place])   2. Chronic systolic heart failure (HCC)   3. Cardiomyopathy, unspecified type ([place])   4. Essential hypertension    PLAN:    In order of problems listed above:  Paroxysmal Afib s/p ablation Patient underwent ablation in January 2023.  Patient had a recent ER visit for allergic reaction and A-fib RVR.  Unsure what he was allergic to given no new foods or medications.  In the ER heart rate was up to 200s.  He was given Cardizem injection 15 mg with conversion to normal sinus rhythm.  Labs showed mild hypokalemia and elevated creatinine.  Today patient is in normal sinus rhythm with frequent PVCs.  He reports he has been in and out of A-fib.  He is also back taking Eliquis 5 mg twice daily.  He is taking Lopressor 25 mg twice daily for rate control, I will increase this to 50 mg twice daily.  I will send him back to EP for further recommendations, may need antiarrhythmic.  CM Last echo (TEE) showed mildly reduced EF 40 to 45%.  Continue Lopressor.  I will start spironolactone 12.5 mg daily, this will also help with hypokalemia. BMET in 1-2 weeks. We can continue GDMT at follow-up.  Diaphoresis This has been  present for about a year. Prior work-up was unrevealing. He reports persistent diaphoretic episodes.  Further work-up per PCP.  HTN Pressures good today.  Addition of spironolactone as above.  Increase Lopressor as above.  Hypokalemia K 3.4 in the ER. I will add spironolactone 12.5 mg daily, BMET in 1 to 2 weeks. PCP will check labs tomorrow as well.   Disposition: Follow up in 3 month(s) with MD/APP    Signed, Joda Braatz Ninfa Meeker, PA-C  08/18/2022 3:34 PM    [facility]

## 2022-08-18 NOTE — Patient Instructions (Addendum)
Medication Instructions:  - Your physician has recommended you make the following change in your medication:   1) INCREASE Metoprolol tartrate to 50 mg: - take 1 tablet by mouth TWICE daily   2) START Spironolactone 25 mg: - take 0.5 (12.5 mg) by mouth once daily   *If you need a refill on your cardiac medications before your next appointment, please call your pharmacy*   Lab Work: - Your physician recommends that you return for lab work in: 1-2 weeks  [facility] 1st desk on the right to check in (REGISTRATION)  Lab hours: Monday- Friday (7:30 am- 5:30 pm)   If you have labs (blood work) drawn today and your tests are completely normal, you will receive your results only by: MyChart Message (if you have MyChart) OR A paper copy in the mail If you have any lab test that is abnormal or we need to change your treatment, we will call you to review the results.   Testing/Procedures: - none ordered   Follow-Up: At [facility], you and your health needs are our priority.  As part of our continuing mission to provide you with exceptional heart care, we have created designated Provider Care Teams.  These Care Teams include your primary Cardiologist (physician) and Advanced Practice Providers (APPs -  Physician Assistants and Nurse Practitioners) who all work together to provide you with the care you need, when you need it.  We recommend signing up for the patient portal called "MyChart".  Sign up information is provided on this After Visit Summary.  MyChart is used to connect with patients for Virtual Visits (Telemedicine).  Patients are able to view lab/test results, encounter notes, upcoming appointments, etc.  Non-urgent messages can be sent to your provider as well.   To learn more about what you can do with MyChart, go to [email or web address].    Your next appointment:   1) As scheduled with Dr. Quentin Ore   The format for your next appointment:    In Person  Provider:   N/a     Other Instructions  Spironolactone Tablets What is this medication? SPIRONOLACTONE (speer on oh LAK tone) treats high blood pressure and heart failure. It may also be used to reduce swelling related to heart, kidney, or liver disease. It helps your kidneys remove more fluid and salt from your blood through the urine without losing too much potassium. It belongs to a group of medications called diuretics. This medicine may be used for other purposes; ask your health care provider or pharmacist if you have questions. COMMON BRAND NAME(S): Aldactone What should I tell my care team before I take this medication? They need to know if you have any of these conditions: Addison's disease or low adrenal gland function High blood level of potassium Kidney disease Liver disease An unusual or allergic reaction to spironolactone, other medications, foods, dyes, or preservatives Pregnant or trying to get pregnant Breast-feeding How should I use this medication? Take this medication by mouth. Take it as directed on the prescription label at the same time every day. You can take it with or without food. You should always take it the same way. Keep taking it unless your care team tells you to stop. Talk to your care team about the use of this medication in children. Special care may be needed. Overdosage: If you think you have taken too much of this medicine contact a poison control center or emergency room at once. NOTE:  This medicine is only for you. Do not share this medicine with others. What if I miss a dose? If you miss a dose, take it as soon as you can. If it is almost time for your next dose, take only that dose. Do not take double or extra doses. What may interact with this medication? Do not take this medication with any of the following: Cidofovir Eplerenone Tranylcypromine This medication may also interact with the following: Aspirin Certain medications  for blood pressure or heart disease, such as benazepril, lisinopril, losartan, valsartan Certain medications that prevent or treat blood clots, such as heparin and enoxaparin Cholestyramine Cyclosporine Digoxin Lithium Medications that relax muscles for surgery NSAIDs, medications for pain and inflammation, such as ibuprofen or naproxen Other diuretics Potassium salts or supplements Steroid medications, such as prednisone or cortisone Trimethoprim This list may not describe all possible interactions. Give your health care provider a list of all the medicines, herbs, non-prescription drugs, or dietary supplements you use. Also tell them if you smoke, drink alcohol, or use illegal drugs. Some items may interact with your medicine. What should I watch for while using this medication? Visit your care team for regular checks on your progress. Check your blood pressure as directed. Know what your blood pressure should be and when to contact your care team. Do not treat yourself for coughs, colds, or pain while you are using this medication without asking your care team for advice. Some medications may increase your blood pressure. Check with your care team if you have severe diarrhea, nausea, and vomiting, or if you sweat a lot. The loss of too much body fluid may make it dangerous for you to take this medication. You may need to be on a special diet while you are taking this medication. Ask your care team. Also, find out how many glasses of fluids you need to drink each day. This medication may affect your coordination, reaction time, or judgment. Do not drive or operate machinery until you know how this medication affects you. Sit up or stand slowly to reduce the risk of dizzy or fainting spells. Drinking alcohol with this medication can increase the risk of these side effects. Avoid salt substitutes unless you are told otherwise by your care team. What side effects may I notice from receiving this  medication? Side effects that you should report to your care team as soon as possible: Allergic reactions--skin rash, itching, hives, swelling of the face, lips, tongue, or throat Dehydration--increased thirst, dry mouth, feeling faint or lightheaded, headache, dark yellow or brown urine High potassium level--muscle weakness, fast or irregular heartbeat Kidney injury--decrease in the amount of urine, swelling of the ankles, hands, or feet Low blood pressure--dizziness, feeling faint or lightheaded, blurry vision Low sodium level--muscle weakness, fatigue, dizziness, headache, confusion Side effects that usually do not require medical attention (report to your care team if they continue or are bothersome): Breast pain or tenderness Changes in sex drive or performance Dizziness Headache Irregular menstrual cycles or spotting Unexpected breast tissue growth This list may not describe all possible side effects. Call your doctor for medical advice about side effects. You may report side effects to FDA at 1-800-FDA-1088. Where should I keep my medication? Keep out of the reach of children and pets. Store below 25 degrees C (77 degrees F). Get rid of any unused medication after the expiration date. To get rid of medications that are no longer needed or have expired: Take the medication to a medication take-back  program. Check with your pharmacy or law enforcement to find a location. If you cannot return the medication, check the label or package insert to see if the medication should be thrown out in the garbage or flushed down the toilet. If you are not sure, ask your care team. If it is safe to put into the trash, take the medication out of the container. Mix the medication with cat litter, dirt, coffee grounds, or other unwanted substance. Seal the mixture in a bag or container. Put it in the trash. NOTE: This sheet is a summary. It may not cover all possible information. If you have questions  about this medicine, talk to your doctor, pharmacist, or health care provider.  2023 Elsevier/Gold Standard (2021-01-10 00:00:00)   Important Information About Sugar

## 2022-08-19 NOTE — Progress Notes (Unsigned)
There were no vitals taken for this visit.   Subjective:    Patient ID: Patrick Short, male    DOB: 07/26/1976, 46 y.o.   MRN: [id number]  HPI: Patrick Short is a 46 y.o. male  No chief complaint on file.  UPPER RESPIRATORY TRACT INFECTION Worst symptom: Fever: {Blank single:19197::"yes","no"} Cough: {Blank single:19197::"yes","no"} Shortness of breath: {Blank single:19197::"yes","no"} Wheezing: {Blank single:19197::"yes","no"} Chest pain: {Blank single:19197::"yes","no","yes, with cough"} Chest tightness: {Blank single:19197::"yes","no"} Chest congestion: {Blank single:19197::"yes","no"} Nasal congestion: {Blank single:19197::"yes","no"} Runny nose: {Blank single:19197::"yes","no"} Post nasal drip: {Blank single:19197::"yes","no"} Sneezing: {Blank single:19197::"yes","no"} Sore throat: {Blank single:19197::"yes","no"} Swollen glands: {Blank single:19197::"yes","no"} Sinus pressure: {Blank single:19197::"yes","no"} Headache: {Blank single:19197::"yes","no"} Face pain: {Blank single:19197::"yes","no"} Toothache: {Blank single:19197::"yes","no"} Ear pain: {Blank single:19197::"yes","no"} {Blank single:19197::""right","left", "bilateral"} Ear pressure: {Blank single:19197::"yes","no"} {Blank single:19197::""right","left", "bilateral"} Eyes red/itching:{Blank single:19197::"yes","no"} Eye drainage/crusting: {Blank single:19197::"yes","no"}  Vomiting: {Blank single:19197::"yes","no"} Rash: {Blank single:19197::"yes","no"} Fatigue: {Blank single:19197::"yes","no"} Sick contacts: {Blank single:19197::"yes","no"} Strep contacts: {Blank single:19197::"yes","no"}  Context: {Blank multiple:19196::"better","worse","stable","fluctuating"} Recurrent sinusitis: {Blank single:19197::"yes","no"} Relief with OTC cold/cough medications: {Blank single:19197::"yes","no"}  Treatments attempted: {Blank multiple:19196::"none","cold/sinus","mucinex","anti-histamine","pseudoephedrine","cough  syrup","antibiotics"}   Relevant past medical, surgical, family and social history reviewed and updated as indicated. Interim medical history since our last visit reviewed. Allergies and medications reviewed and updated.  Review of Systems  Per HPI unless specifically indicated above     Objective:    There were no vitals taken for this visit.  Wt Readings from Last 3 Encounters:  08/18/22 167 lb 12.8 oz (76.1 kg)  08/14/22 160 lb (72.6 kg)  06/06/22 164 lb 14.4 oz (74.8 kg)    Physical Exam  Results for orders placed or performed during the hospital encounter of 08/14/22  CBC with Differential  Result Value Ref Range   WBC 16.4 (H) 4.0 - 10.5 K/uL   RBC 5.70 4.22 - 5.81 MIL/uL   Hemoglobin 18.5 (H) 13.0 - 17.0 g/dL   HCT 62.9 (H) 47.6 - 54.6 %   MCV 94.0 80.0 - 100.0 fL   MCH 32.5 26.0 - 34.0 pg   MCHC 34.5 30.0 - 36.0 g/dL   RDW 50.3 54.6 - 56.8 %   Platelets 277 150 - 400 K/uL   nRBC 0.0 0.0 - 0.2 %   Neutrophils Relative % 68 %   Neutro Abs 11.1 (H) 1.7 - 7.7 K/uL   Lymphocytes Relative 23 %   Lymphs Abs 3.7 0.7 - 4.0 K/uL   Monocytes Relative 8 %   Monocytes Absolute 1.3 (H) 0.1 - 1.0 K/uL   Eosinophils Relative 1 %   Eosinophils Absolute 0.1 0.0 - 0.5 K/uL   Basophils Relative 0 %   Basophils Absolute 0.1 0.0 - 0.1 K/uL   Immature Granulocytes 0 %   Abs Immature Granulocytes 0.05 0.00 - 0.07 K/uL  Comprehensive metabolic panel  Result Value Ref Range   Sodium 140 135 - 145 mmol/L   Potassium 3.3 (L) 3.5 - 5.1 mmol/L   Chloride 103 98 - 111 mmol/L   CO2 24 22 - 32 mmol/L   Glucose, Bld 124 (H) 70 - 99 mg/dL   BUN 18 6 - 20 mg/dL   Creatinine, Ser 1.27 (H) 0.61 - 1.24 mg/dL   Calcium 9.8 8.9 - 51.7 mg/dL   Total Protein 8.2 (H) 6.5 - 8.1 g/dL   Albumin 4.5 3.5 - 5.0 g/dL   AST 31 15 - 41 U/L   ALT 21 0 - 44 U/L   Alkaline Phosphatase 81 38 - 126 U/L   Total Bilirubin 0.5 0.3 - 1.2  mg/dL   GFR, Estimated >60 >60 mL/min   Anion gap 13 5 - 15       Assessment & Plan:   Problem List Items Addressed This Visit   None    Follow up plan: No follow-ups on file.

## 2022-08-20 ENCOUNTER — Ambulatory Visit (INDEPENDENT_AMBULATORY_CARE_PROVIDER_SITE_OTHER): Payer: Medicaid Other | Admitting: Nurse Practitioner

## 2022-08-20 ENCOUNTER — Encounter: Payer: Self-pay | Admitting: Nurse Practitioner

## 2022-08-20 VITALS — BP 136/81 | HR 42 | Temp 98.1°F | Wt 163.4 lb

## 2022-08-20 DIAGNOSIS — R062 Wheezing: Secondary | ICD-10-CM | POA: Diagnosis not present

## 2022-08-20 DIAGNOSIS — I4819 Other persistent atrial fibrillation: Secondary | ICD-10-CM

## 2022-08-20 DIAGNOSIS — Z23 Encounter for immunization: Secondary | ICD-10-CM

## 2022-08-20 DIAGNOSIS — Z1211 Encounter for screening for malignant neoplasm of colon: Secondary | ICD-10-CM | POA: Diagnosis not present

## 2022-08-20 DIAGNOSIS — R6889 Other general symptoms and signs: Secondary | ICD-10-CM

## 2022-08-20 LAB — VERITOR FLU A/B WAIVED
Influenza A: NEGATIVE
Influenza B: NEGATIVE

## 2022-08-20 MED ORDER — PREDNISONE 10 MG PO TABS: 10.0000 mg | ORAL_TABLET | Freq: Every day | ORAL | 0 refills | Status: DC

## 2022-08-20 NOTE — Progress Notes (Signed)
Results discussed with patient during visit.

## 2022-08-20 NOTE — Assessment & Plan Note (Signed)
Chronic. Followed by Cardiology. Started on Aldactone due to hypokalemia.  Will recheck Bmp at visit today. Continue to follow their recommendations.

## 2022-08-21 ENCOUNTER — Ambulatory Visit: Payer: Medicaid Other | Admitting: Internal Medicine

## 2022-08-21 LAB — BASIC METABOLIC PANEL
BUN/Creatinine Ratio: 10 (ref 9–20)
BUN: 9 mg/dL (ref 6–24)
CO2: 27 mmol/L (ref 20–29)
Calcium: 9.4 mg/dL (ref 8.7–10.2)
Chloride: 102 mmol/L (ref 96–106)
Creatinine, Ser: 0.9 mg/dL (ref 0.76–1.27)
Glucose: 81 mg/dL (ref 70–99)
Potassium: 3.6 mmol/L (ref 3.5–5.2)
Sodium: 143 mmol/L (ref 134–144)
eGFR: 106 mL/min/{1.73_m2} (ref >59)

## 2022-08-21 NOTE — Progress Notes (Signed)
Hi Patrick Short.  Your kidneys look good and your potassium improved. Continue with current medication regimen.

## 2022-08-22 ENCOUNTER — Telehealth: Payer: Self-pay

## 2022-08-22 DIAGNOSIS — Z1211 Encounter for screening for malignant neoplasm of colon: Secondary | ICD-10-CM

## 2022-08-22 LAB — NOVEL CORONAVIRUS, NAA: SARS-CoV-2, NAA: NOT DETECTED

## 2022-08-22 NOTE — Telephone Encounter (Signed)
..     Pre-operative Risk Assessment    Patient Name: Patrick Short  DOB: 11/28/1975 MRN: [id number]      Request for Surgical Clearance    Procedure:   colonoscopy  Date of Surgery:  Clearance TBD                                 Surgeon:  Loreli Slot Surgeon's Group or Practice Name:  [facility] Phone number:  [phone] Fax number:  [phone]   Type of Clearance Requested:   - Medical  - Pharmacy:  Hold Apixaban (Eliquis)     Type of Anesthesia:  General    Additional requests/questions:    Jola Babinski   08/22/2022, 1:49 PM

## 2022-08-22 NOTE — Telephone Encounter (Signed)
Patient had recent return of a fib s/p ablation 10/2021. Seen by Terrilee Croak, PA on 08/18/22. Would recommend that he postpone colonoscopy since he would need to hold Eliquis for this procedure unless it is critical.  I attempted to call him and there was no answer and voice mail is full. Preop call back team, would you please reach out to requesting office to advise them of these recent development.  Thank you, Patrick Aland, NP-C 08/22/2022, 4:26 PM [address] Office [phone] Fax [phone]

## 2022-08-22 NOTE — Telephone Encounter (Signed)
Tried to call the requesting office though they ar closed. I will fax these notes to requesting office to see notes from Eligha Bridegroom, NP.

## 2022-08-22 NOTE — Telephone Encounter (Signed)
Gastroenterology Pre-Procedure Review  Request Date: TBD after Cardiac Clearance Obtained Requesting Physician: Dr. Jodelle Gross  PATIENT REVIEW QUESTIONS: The patient responded to the following health history questions as indicated:    1. Are you having any GI issues? no 2. Do you have a personal history of Polyps? no 3. Do you have a family history of Colon Cancer or Polyps?  unsure 4. Diabetes Mellitus? no 5. Joint replacements in the past 12 months? January 9th heart ablation, blood clot   6. Major health problems in the past 3 months?yes (Pneumonia currently) 7. Any artificial heart valves, MVP, or defibrillator? Stent placed , cardiomyopathy, atrial fib    MEDICATIONS & ALLERGIES:    Patient reports the following regarding taking any anticoagulation/antiplatelet therapy:   Plavix, Coumadin, Eliquis, Xarelto, Lovenox, Pradaxa, Brilinta, or Effient? yes (eliquis) Cardiac clearance sent to Dr. Okey Dupre.  Pt said he see's Dr. Okey Dupre and Dr.Lambert.  Sent to both physicians to advise prior to scheduling Aspirin? no  Patient confirms/reports the following medications:  Current Outpatient Medications  Medication Sig Dispense Refill   apixaban (ELIQUIS) 5 MG TABS tablet Take 1 tablet by mouth 2 (two) times daily.     busPIRone (BUSPAR) 10 MG tablet TAKE 1 TABLET BY MOUTH TWICE A DAY 180 tablet 0   EPINEPHrine 0.3 mg/0.3 mL IJ SOAJ injection Inject 0.3 mg into the muscle as needed for anaphylaxis. 1 each 1   famotidine (PEPCID) 20 MG tablet Take 1 tablet (20 mg total) by mouth 2 (two) times daily. (Patient not taking: Reported on 08/20/2022) 8 tablet 0   metoprolol tartrate (LOPRESSOR) 50 MG tablet Take 1 tablet (50 mg total) by mouth 2 (two) times daily. 180 tablet 3   predniSONE (DELTASONE) 10 MG tablet Take 1 tablet (10 mg total) by mouth daily with breakfast. Take 6 today and 5 tomorrow and decrease by 1 each day until course is complete 21 tablet 0   rOPINIRole (REQUIP) 1 MG tablet Take 1 tablet (1 mg  total) by mouth at bedtime. 90 tablet 1   spironolactone (ALDACTONE) 25 MG tablet Take 0.5 tablets (12.5 mg total) by mouth daily. 30 tablet 6   traZODone (DESYREL) 50 MG tablet Take 1-2 tablets (50-100 mg total) by mouth at bedtime as needed. for sleep 180 tablet 1   venlafaxine XR (EFFEXOR XR) 75 MG 24 hr capsule Take 1 capsule (75 mg total) by mouth daily with breakfast. 90 capsule 1   VENTOLIN HFA 108 (90 Base) MCG/ACT inhaler TAKE 2 PUFFS BY MOUTH EVERY 6 HOURS AS NEEDED FOR WHEEZE OR SHORTNESS OF BREATH 18 each 0   No current facility-administered medications for this visit.    Patient confirms/reports the following allergies:  Allergies  Allergen Reactions   Aspirin Anaphylaxis and Shortness Of Breath   Bee Venom Anaphylaxis   Penicillins Rash and Anaphylaxis   Sulfa Antibiotics Anaphylaxis    No orders of the defined types were placed in this encounter.   AUTHORIZATION INFORMATION Primary Insurance: 1D#: Group #:  Secondary Insurance: 1D#: Group #:  SCHEDULE INFORMATION: Date: TBD After Cardiac Clearance has been obtained Time: Location: [facility]

## 2022-08-22 NOTE — Progress Notes (Signed)
Contacted via MyChart   Covid testing is negative!!

## 2022-08-25 NOTE — Telephone Encounter (Signed)
Patient has been informed that we will need to post pone his colonoscopy due to most recent A-Fib, and when he can hold Eliquis.  Thanks,  [place], [place]

## 2022-08-27 ENCOUNTER — Encounter: Payer: Self-pay | Admitting: Cardiology

## 2022-08-27 DIAGNOSIS — R0989 Other specified symptoms and signs involving the circulatory and respiratory systems: Secondary | ICD-10-CM | POA: Diagnosis not present

## 2022-08-27 DIAGNOSIS — I959 Hypotension, unspecified: Secondary | ICD-10-CM | POA: Diagnosis not present

## 2022-08-27 DIAGNOSIS — R000 Tachycardia, unspecified: Secondary | ICD-10-CM | POA: Diagnosis not present

## 2022-08-27 DIAGNOSIS — I4891 Unspecified atrial fibrillation: Secondary | ICD-10-CM | POA: Diagnosis not present

## 2022-08-27 DIAGNOSIS — I499 Cardiac arrhythmia, unspecified: Secondary | ICD-10-CM | POA: Diagnosis not present

## 2022-08-28 ENCOUNTER — Encounter: Payer: Self-pay | Admitting: Internal Medicine

## 2022-08-28 NOTE — Telephone Encounter (Signed)
Called pt regarding mychart message.Pt reported last night while laying around he developed an episode of Afib RVR. He stated he felt like his heart was pounding so he contacted EMS. He report EKG revealed Afib with HR of 160's. Recommendations were to go to ER, however pt stated he wanted to go to [facility] but stated EMS refused to transport. Pt stated symptoms lasted roughly an hour but eventually HR decreased to 60. This morning pt reports he feels well and HR currently 72.  Will forward to MD for recommendations

## 2022-09-02 ENCOUNTER — Ambulatory Visit (HOSPITAL_COMMUNITY): Payer: Medicaid Other | Admitting: Physician Assistant

## 2022-09-02 ENCOUNTER — Encounter (HOSPITAL_COMMUNITY): Payer: Self-pay

## 2022-09-02 NOTE — Progress Notes (Incomplete)
Primary Care Physician: Jon Billings, NP Primary Cardiologist: Dr End Primary Electrophysiologist: Dr Quentin Ore Referring Physician: Dr Lahoma Crocker is a 46 y.o. male with a history of HTN, PTSD, systolic dysfunction, atrial flutter, atrial fibrillation who presents for follow up in the [facility]. Patient is on Eliquis for a CHADS2VASC score of 2. He underwent afib and flutter ablation on 10/21/21. Patient had an ER visit 08/14/22 for an allergic reaction and afib. He is unsure what the reaction was to since there were no changes to food or medications. He didn't use an Epipen because his heart rate was high. EKG showed afib with rates up to 200s. He was given cardizem injection 15mg  with conversion to NSR. Labs showed hypokalemia and elevated Scr. He was re-started on Eliquis.   On follow up today, ***  Today, he denies symptoms of ***palpitations, chest pain, shortness of breath, orthopnea, PND, lower extremity edema, dizziness, presyncope, syncope, snoring, daytime somnolence, bleeding, or neurologic sequela. The patient is tolerating medications without difficulties and is otherwise without complaint today.    Atrial Fibrillation Risk Factors:  he does not have symptoms or diagnosis of sleep apnea. he does not have a history of rheumatic fever.   he has a BMI of There is no height or weight on file to calculate BMI.. There were no vitals filed for this visit.   Family History  Problem Relation Age of Onset   Hypertension Mother    Atrial fibrillation Mother    Hypertension Father    Heart disease Maternal Grandmother    Hypertension Maternal Grandmother    Hypertension Paternal Grandfather    Coronary artery disease Paternal Grandfather    Other Son        Trisomy 21     Atrial Fibrillation Management history:  Previous antiarrhythmic drugs: none Previous cardioversions: none Previous ablations: 10/21/21 CHADS2VASC score:  2 Anticoagulation history: Eliquis   Past Medical History:  Diagnosis Date   Anxiety    Depression    Dysrhythmia    Hypertension    Mineral deficiency    Paroxysmal atrial fibrillation (HCC)    PTSD (post-traumatic stress disorder)    Past Surgical History:  Procedure Laterality Date   APPENDECTOMY     ATRIAL FIBRILLATION ABLATION N/A 10/21/2021   Procedure: ATRIAL FIBRILLATION ABLATION;  Surgeon: Vickie Epley, MD;  Location: [facility];  Service: Cardiovascular;  Laterality: N/A;   EYE SURGERY     laceration to pupil   FRACTURE SURGERY Left    Rod- which was replaced due to rejection of original rod   SPINAL FUSION     SPLENECTOMY, TOTAL     TEE WITHOUT CARDIOVERSION N/A 10/21/2021   Procedure: TRANSESOPHAGEAL ECHOCARDIOGRAM (TEE);  Surgeon: Vickie Epley, MD;  Location: [facility];  Service: Cardiovascular;  Laterality: N/A;    Current Outpatient Medications  Medication Sig Dispense Refill   apixaban (ELIQUIS) 5 MG TABS tablet Take 1 tablet by mouth 2 (two) times daily.     busPIRone (BUSPAR) 10 MG tablet TAKE 1 TABLET BY MOUTH TWICE A DAY 180 tablet 0   EPINEPHrine 0.3 mg/0.3 mL IJ SOAJ injection Inject 0.3 mg into the muscle as needed for anaphylaxis. 1 each 1   famotidine (PEPCID) 20 MG tablet Take 1 tablet (20 mg total) by mouth 2 (two) times daily. (Patient not taking: Reported on 08/20/2022) 8 tablet 0   metoprolol tartrate (LOPRESSOR) 50 MG tablet Take  1 tablet (50 mg total) by mouth 2 (two) times daily. 180 tablet 3   predniSONE (DELTASONE) 10 MG tablet Take 1 tablet (10 mg total) by mouth daily with breakfast. Take 6 today and 5 tomorrow and decrease by 1 each day until course is complete 21 tablet 0   rOPINIRole (REQUIP) 1 MG tablet Take 1 tablet (1 mg total) by mouth at bedtime. 90 tablet 1   spironolactone (ALDACTONE) 25 MG tablet Take 0.5 tablets (12.5 mg total) by mouth daily. 30 tablet 6   traZODone (DESYREL) 50 MG tablet Take 1-2 tablets  (50-100 mg total) by mouth at bedtime as needed. for sleep 180 tablet 1   venlafaxine XR (EFFEXOR XR) 75 MG 24 hr capsule Take 1 capsule (75 mg total) by mouth daily with breakfast. 90 capsule 1   VENTOLIN HFA 108 (90 Base) MCG/ACT inhaler TAKE 2 PUFFS BY MOUTH EVERY 6 HOURS AS NEEDED FOR WHEEZE OR SHORTNESS OF BREATH 18 each 0   No current facility-administered medications for this visit.    Allergies  Allergen Reactions   Aspirin Anaphylaxis and Shortness Of Breath   Bee Venom Anaphylaxis   Penicillins Rash and Anaphylaxis   Sulfa Antibiotics Anaphylaxis    Social History   Socioeconomic History   Marital status: Single    Spouse name: Not on file   Number of children: Not on file   Years of education: Not on file   Highest education level: Not on file  Occupational History   Not on file  Tobacco Use   Smoking status: Former    Packs/day: 1.00    Years: 25.00    Total pack years: 25.00    Types: Cigarettes   Smokeless tobacco: Never   Tobacco comments:    Former smoker 11/18/21  Vaping Use   Vaping Use: Never used  Substance and Sexual Activity   Alcohol use: Not Currently   Drug use: Yes    Frequency: 7.0 times per week    Types: Marijuana    Comment: daily   Sexual activity: Yes  Other Topics Concern   Not on file  Social History Narrative   Not on file   Social Determinants of Health   Financial Resource Strain: Not on file  Food Insecurity: Not on file  Transportation Needs: Not on file  Physical Activity: Not on file  Stress: Not on file  Social Connections: Not on file  Intimate Partner Violence: Not on file     ROS- All systems are reviewed and negative except as per the HPI above.  Physical Exam: There were no vitals filed for this visit.   GEN- The patient is a well appearing *** {Desc; male/male:[id number]}, alert and oriented x 3 today.   HEENT-head normocephalic, atraumatic, sclera clear, conjunctiva pink, hearing intact, trachea  midline. Lungs- Clear to ausculation bilaterally, normal work of breathing Heart- ***Regular rate and rhythm, no murmurs, rubs or gallops  GI- soft, NT, ND, + BS Extremities- no clubbing, cyanosis, or edema MS- no significant deformity or atrophy Skin- no rash or lesion Psych- euthymic mood, full affect Neuro- strength and sensation are intact   Wt Readings from Last 3 Encounters:  08/20/22 74.1 kg  08/18/22 76.1 kg  08/14/22 72.6 kg    EKG today demonstrates  ***  Echo 05/22/22 demonstrated   1. Left ventricular ejection fraction, by estimation, is 55 to 60%. The  left ventricle has normal function. The left ventricle has no regional  wall motion abnormalities.  2. Right ventricular systolic function is normal. The right ventricular  size is normal.   3. The mitral valve is normal in structure. No evidence of mitral valve  regurgitation.   4. The aortic valve is tricuspid. Aortic valve regurgitation is not  visualized.   Epic records are reviewed at length today  CHA2DS2-VASc Score = 2  The patient's score is based upon: CHF History: 1 HTN History: 1 Diabetes History: 0 Stroke History: 0 Vascular Disease History: 0 Age Score: 0 Gender Score: 0       ASSESSMENT AND PLAN: 1. Paroxysmal Atrial Fibrillation/atrial flutter The patient's CHA2DS2-VASc score is 2, indicating a 2.2% annual risk of stroke.   S/p afib and flutter ablation 10/21/21 ***flec with stress test, multaq, repeat ablation Continue Eliquis 5 mg BID  Continue Lopressor 50 mg BID  2. Secondary Hypercoagulable State (ICD10:  D68.69) The patient is at significant risk for stroke/thromboembolism based upon his CHA2DS2-VASc Score of 2.  Continue Apixaban (Eliquis).   3. HTN Stable, no changes today. 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 Repeat echo 05/22/22 showed EF recovered to 55-60% Fluid status appears stable. ***   Follow up ***   [name] PA-C [facility] [address] [place], [place] [address] [phone] 09/02/2022 1:54 PM

## 2022-09-03 ENCOUNTER — Ambulatory Visit: Payer: Medicaid Other | Admitting: Cardiology

## 2022-09-08 ENCOUNTER — Encounter: Payer: Self-pay | Admitting: Nurse Practitioner

## 2022-09-08 ENCOUNTER — Ambulatory Visit (INDEPENDENT_AMBULATORY_CARE_PROVIDER_SITE_OTHER): Payer: Medicaid Other | Admitting: Nurse Practitioner

## 2022-09-08 VITALS — BP 107/68 | HR 61 | Temp 98.2°F | Wt 164.2 lb

## 2022-09-08 DIAGNOSIS — Z6826 Body mass index (BMI) 26.0-26.9, adult: Secondary | ICD-10-CM

## 2022-09-08 DIAGNOSIS — Z114 Encounter for screening for human immunodeficiency virus [HIV]: Secondary | ICD-10-CM | POA: Diagnosis not present

## 2022-09-08 DIAGNOSIS — I4819 Other persistent atrial fibrillation: Secondary | ICD-10-CM | POA: Diagnosis not present

## 2022-09-08 DIAGNOSIS — E7800 Pure hypercholesterolemia, unspecified: Secondary | ICD-10-CM | POA: Diagnosis not present

## 2022-09-08 DIAGNOSIS — G479 Sleep disorder, unspecified: Secondary | ICD-10-CM

## 2022-09-08 DIAGNOSIS — F3289 Other specified depressive episodes: Secondary | ICD-10-CM | POA: Diagnosis not present

## 2022-09-08 DIAGNOSIS — I10 Essential (primary) hypertension: Secondary | ICD-10-CM

## 2022-09-08 DIAGNOSIS — Z1159 Encounter for screening for other viral diseases: Secondary | ICD-10-CM

## 2022-09-08 DIAGNOSIS — F419 Anxiety disorder, unspecified: Secondary | ICD-10-CM

## 2022-09-08 DIAGNOSIS — I429 Cardiomyopathy, unspecified: Secondary | ICD-10-CM

## 2022-09-08 DIAGNOSIS — G2581 Restless legs syndrome: Secondary | ICD-10-CM

## 2022-09-08 DIAGNOSIS — R7309 Other abnormal glucose: Secondary | ICD-10-CM | POA: Diagnosis not present

## 2022-09-08 MED ORDER — VENLAFAXINE HCL ER 75 MG PO CP24: 75.0000 mg | ORAL_CAPSULE | Freq: Every day | ORAL | 1 refills | Status: DC

## 2022-09-08 MED ORDER — ROPINIROLE HCL 1 MG PO TABS: 1.0000 mg | ORAL_TABLET | Freq: Every day | ORAL | 1 refills | Status: DC

## 2022-09-08 MED ORDER — TRAZODONE HCL 50 MG PO TABS: 50.0000 mg | ORAL_TABLET | Freq: Every evening | ORAL | 1 refills | Status: DC | PRN

## 2022-09-08 MED ORDER — BUSPIRONE HCL 10 MG PO TABS: 10.0000 mg | ORAL_TABLET | Freq: Two times a day (BID) | ORAL | 1 refills | Status: DC

## 2022-09-08 NOTE — Assessment & Plan Note (Signed)
Chronic. Followed by Cardiology. Started on Aldactone due to hypokalemia.  Will recheck CMP at visit today. Continue to follow their recommendations.

## 2022-09-08 NOTE — Progress Notes (Signed)
BP 107/68   Pulse 61   Temp 98.2 F (36.8 C) (Oral)   Wt 164 lb 3.2 oz (74.5 kg)   SpO2 96%   BMI 26.50 kg/m    Subjective:    Patient ID: Patrick Short, male    DOB: Nov 04, 1975, 46 y.o.   MRN: [id number]  HPI: Patrick Short is a 46 y.o. male  Chief Complaint  Patient presents with   Depression   Diabetes   Hyperlipidemia   Hypertension   HYPERTENSION Hypertension status: stable  Satisfied with current treatment? yes Duration of hypertension: years BP monitoring frequency:  daily BP range: 120/80 BP medication side effects:  no Medication compliance: excellent compliance Previous BP meds:Metoprolol Aspirin: no Recurrent headaches: no Visual changes: no Palpitations: no Dyspnea: no Chest pain: no Lower extremity edema: no Dizzy/lightheaded: no   ANXIETY/DEPRESSION Patient states that he has been good.  He is still taking Effexor 19m daily.  Buspar BID.  Feels like the combination is working well for him.  Feels more even.  Feels like the trazadone works sometimes and doesn't work other times. He is getting 6-7 hours per night.    [place] [facility] Total Score 1         09/08/2022    9:53 AM 08/20/2022    1:10 PM 06/06/2022   10:28 AM 05/23/2022    1:09 PM  GAD 7 : Generalized Anxiety Score  Nervous, Anxious, on Edge 0 _0 Control/stop worrying 0 0 0 0  Worry too much - different things 0 0 0 0  Trouble relaxing 0 _1 Restless _2 Easily annoyed or irritable 0 0 1 1  Afraid - awful might happen 0 0 0 0  Total GAD 7 Score _3 Anxiety Difficulty Not difficult at all Not difficult at all Somewhat difficult Somewhat difficult     Relevant past medical, surgical, family and social history reviewed and updated as indicated. Interim medical history since our last visit reviewed. Allergies and medications reviewed and updated.  Review of Systems  Eyes:  Negative for visual  disturbance.  Respiratory:  Negative for shortness of breath.   Cardiovascular:  Negative for chest pain and leg swelling.  Neurological:  Negative for light-headedness and headaches.       Restless leg  Psychiatric/Behavioral:  Positive for dysphoric mood. Negative for suicidal ideas. The patient is nervous/anxious.     Per HPI unless specifically indicated above     Objective:    BP 107/68   Pulse 61   Temp 98.2 F (36.8 C) (Oral)   Wt 164 lb 3.2 oz (74.5 kg)   SpO2 96%   BMI 26.50 kg/m   Wt Readings from Last 3 Encounters:  09/08/22 164 lb 3.2 oz (74.5 kg)  08/20/22 163 lb 6.4 oz (74.1 kg)  08/18/22 167 lb 12.8 oz (76.1 kg)    Physical Exam Vitals and nursing note reviewed.  Constitutional:      General: He is not in acute distress.    Appearance: Normal appearance. He is not ill-appearing, toxic-appearing or diaphoretic.  HENT:     Head: Normocephalic.     Right Ear: External ear normal.     Left Ear: External ear normal.     Nose: Nose normal. No congestion or rhinorrhea.     Mouth/Throat:     Mouth: Mucous membranes are moist.  Eyes:     General:        Right eye: No discharge.        Left eye: No discharge.     Extraocular Movements: Extraocular movements intact.     Conjunctiva/sclera: Conjunctivae normal.     Pupils: Pupils are equal, round, and reactive to light.  Cardiovascular:     Rate and Rhythm: Normal rate and regular rhythm.     Heart sounds: No murmur heard. Pulmonary:     Effort: Pulmonary effort is normal. No respiratory distress.     Breath sounds: Normal breath sounds. No wheezing, rhonchi or rales.  Abdominal:     General: Abdomen is flat. Bowel sounds are normal.  Musculoskeletal:     Cervical back: Normal range of motion and neck supple.  Skin:    General: Skin is warm and dry.     Capillary Refill: Capillary refill takes less than 2 seconds.  Neurological:     General: No focal deficit present.     Mental Status: He is alert and  oriented to person, place, and time.  Psychiatric:        Mood and Affect: Mood normal.        Behavior: Behavior normal.        Thought Content: Thought content normal.        Judgment: Judgment normal.     Results for orders placed or performed in visit on 08/20/22  Novel Coronavirus, NAA ([facility])   Specimen: Nasopharyngeal(NP) swabs in vial transport medium  Result Value Ref Range   SARS-CoV-2, NAA Not Detected Not Detected  Basic Metabolic Panel (BMET)  Result Value Ref Range   Glucose 81 70 - 99 mg/dL   BUN 9 6 - 24 mg/dL   Creatinine, Ser 0.90 0.76 - 1.27 mg/dL   eGFR 106 >59 mL/min/1.73   BUN/Creatinine Ratio 10 9 - 20   Sodium 143 134 - 144 mmol/L   Potassium 3.6 3.5 - 5.2 mmol/L   Chloride 102 96 - 106 mmol/L   CO2 27 20 - 29 mmol/L   Calcium 9.4 8.7 - 10.2 mg/dL  Influenza A & B (STAT)  Result Value Ref Range   Influenza A Negative Negative   Influenza B Negative Negative      Assessment & Plan:   Problem List Items Addressed This Visit       Cardiovascular and Mediastinum   Essential hypertension    Chronic.  Controlled.  Continue with current medication regimen on Metoprolol 57m BID.  Continue to check blood pressure at home.  Refills sent today.  Labs ordered today.  Return to clinic in 3 months for reevaluation.  Call sooner if concerns arise.        Relevant Orders   Comp Met (CMET)   Atrial fibrillation (HCC)    Chronic. Followed by Cardiology. Started on Aldactone due to hypokalemia.  Will recheck CMP at visit today. Continue to follow their recommendations.      Cardiomyopathy ([place] [place]    Chronic.  Controlled.  Continue with current medication regimen.  Labs ordered today.  Return to clinic in 6 months for reevaluation.  Call sooner if concerns arise.          Other   Depression    Chronic.  Controlled.  Continue with current medication regimen of Buspar and Effexor.  Labs ordered today.  Return to clinic in 3 months for  reevaluation.  Call sooner if concerns arise.  Relevant Medications   busPIRone (BUSPAR) 10 MG tablet   traZODone (DESYREL) 50 MG tablet   venlafaxine XR (EFFEXOR XR) 75 MG 24 hr capsule   Anxiety    Chronic.  Controlled.  Continue with current medication regimen of Buspar and Effexor.  Labs ordered today.  Return to clinic in 3 months for reevaluation.  Call sooner if concerns arise.        Relevant Medications   busPIRone (BUSPAR) 10 MG tablet   traZODone (DESYREL) 50 MG tablet   venlafaxine XR (EFFEXOR XR) 75 MG 24 hr capsule   Restless leg syndrome    Chronic.  Controlled.  Continue with current medication regimen on Requip 24m.  Refills sent today.  Labs ordered today.  Return to clinic in 3 months for reevaluation.  Call sooner if concerns arise.        Elevated LDL cholesterol level    Labs ordered at visit today.  Will make recommendations based on lab results.        Relevant Orders   Lipid Profile   Sleep disturbance    Chronic.  Controlled.  Continue with current medication regimen of Trazadone.  Refills sent today.  Labs ordered today.  Return to clinic in 3 months for reevaluation.  Call sooner if concerns arise.        Elevated glucose    Labs ordered at visit today.  Will make recommendations based on lab results.        Relevant Orders   HgB A1c   BMI 26.0-26.9,adult    Recommended eating smaller high protein, low fat meals more frequently and exercising 30 mins a day 5 times a week with a goal of 10-15lb weight loss in the next 3 months. Patient voiced their understanding and motivation to adhere to these recommendations.       Other Visit Diagnoses     Screening for HIV (human immunodeficiency virus)       Relevant Orders   HIV Antibody (routine testing w rflx)   Encounter for hepatitis C screening test for low risk patient       Relevant Orders   Hepatitis C Antibody        Follow up plan: Return in about 3 months (around 12/09/2022) for  HTN, HLD, DM2 FU.

## 2022-09-08 NOTE — Assessment & Plan Note (Signed)
Labs ordered at visit today.  Will make recommendations based on lab results.   

## 2022-09-08 NOTE — Assessment & Plan Note (Signed)
Chronic.  Controlled.  Continue with current medication regimen of Buspar and Effexor.  Labs ordered today.  Return to clinic in 3 months for reevaluation.  Call sooner if concerns arise.

## 2022-09-08 NOTE — Assessment & Plan Note (Addendum)
Chronic.  Controlled.  Continue with current medication regimen on Requip 1mg .  Refills sent today.  Labs ordered today.  Return to clinic in 3 months for reevaluation.  Call sooner if concerns arise.

## 2022-09-08 NOTE — Assessment & Plan Note (Signed)
Chronic.  Controlled.  Continue with current medication regimen of Trazadone.  Refills sent today.  Labs ordered today.  Return to clinic in 3 months for reevaluation.  Call sooner if concerns arise.

## 2022-09-08 NOTE — Assessment & Plan Note (Signed)
Chronic.  Controlled.  Continue with current medication regimen of Buspar and Effexor.  Labs ordered today.  Return to clinic in 3 months for reevaluation.  Call sooner if concerns arise.  

## 2022-09-08 NOTE — Assessment & Plan Note (Signed)
Recommended eating smaller high protein, low fat meals more frequently and exercising 30 mins a day 5 times a week with a goal of 10-15lb weight loss in the next 3 months. Patient voiced their understanding and motivation to adhere to these recommendations.  

## 2022-09-08 NOTE — Assessment & Plan Note (Signed)
Chronic.  Controlled.  Continue with current medication regimen.  Labs ordered today.  Return to clinic in 6 months for reevaluation.  Call sooner if concerns arise.  ? ?

## 2022-09-08 NOTE — Assessment & Plan Note (Signed)
Chronic.  Controlled.  Continue with current medication regimen on Metoprolol 25mg  BID.  Continue to check blood pressure at home.  Refills sent today.  Labs ordered today.  Return to clinic in 3 months for reevaluation.  Call sooner if concerns arise.

## 2022-09-09 LAB — COMPREHENSIVE METABOLIC PANEL
ALT: 18 IU/L (ref 0–44)
AST: 14 IU/L (ref 0–40)
Albumin/Globulin Ratio: 1.8 (ref 1.2–2.2)
Albumin: 4.3 g/dL (ref 4.1–5.1)
Alkaline Phosphatase: 80 IU/L (ref 44–121)
BUN/Creatinine Ratio: 8 — ABNORMAL LOW (ref 9–20)
BUN: 7 mg/dL (ref 6–24)
Bilirubin Total: <0.2 mg/dL (ref 0.0–1.2)
CO2: 23 mmol/L (ref 20–29)
Calcium: 9.2 mg/dL (ref 8.7–10.2)
Chloride: 107 mmol/L — ABNORMAL HIGH (ref 96–106)
Creatinine, Ser: 0.87 mg/dL (ref 0.76–1.27)
Globulin, Total: 2.4 g/dL (ref 1.5–4.5)
Glucose: 68 mg/dL — ABNORMAL LOW (ref 70–99)
Potassium: 3.8 mmol/L (ref 3.5–5.2)
Sodium: 143 mmol/L (ref 134–144)
Total Protein: 6.7 g/dL (ref 6.0–8.5)
eGFR: 107 mL/min/{1.73_m2} (ref >59)

## 2022-09-09 LAB — LIPID PANEL
Chol/HDL Ratio: 3.9 ratio (ref 0.0–5.0)
Cholesterol, Total: 170 mg/dL (ref 100–199)
HDL: 44 mg/dL (ref >39)
LDL Chol Calc (NIH): 100 mg/dL — ABNORMAL HIGH (ref 0–99)
Triglycerides: 146 mg/dL (ref 0–149)
VLDL Cholesterol Cal: 26 mg/dL (ref 5–40)

## 2022-09-09 LAB — HEMOGLOBIN A1C
Est. average glucose Bld gHb Est-mCnc: 105 mg/dL
Hgb A1c MFr Bld: 5.3 % (ref 4.8–5.6)

## 2022-09-09 LAB — HIV ANTIBODY (ROUTINE TESTING W REFLEX): HIV Screen 4th Generation wRfx: NONREACTIVE

## 2022-09-09 LAB — HEPATITIS C ANTIBODY: Hep C Virus Ab: NONREACTIVE

## 2022-09-09 NOTE — Progress Notes (Signed)
Hi Patrick Short. It was nice to see you yesterday.  Your lab work looks good.  No concerns at this time. Continue with your current medication regimen.  Follow up as discussed.  Please let me know if you have any questions.

## 2022-09-17 ENCOUNTER — Ambulatory Visit: Payer: Medicaid Other | Admitting: Cardiology

## 2022-10-14 DIAGNOSIS — J069 Acute upper respiratory infection, unspecified: Secondary | ICD-10-CM | POA: Diagnosis not present

## 2022-10-14 DIAGNOSIS — J189 Pneumonia, unspecified organism: Secondary | ICD-10-CM | POA: Diagnosis not present

## 2022-10-14 DIAGNOSIS — B9789 Other viral agents as the cause of diseases classified elsewhere: Secondary | ICD-10-CM | POA: Diagnosis not present

## 2022-10-14 DIAGNOSIS — R059 Cough, unspecified: Secondary | ICD-10-CM | POA: Diagnosis not present

## 2022-10-29 ENCOUNTER — Telehealth: Payer: Self-pay | Admitting: Nurse Practitioner

## 2022-10-29 NOTE — Telephone Encounter (Signed)
Patient is calling to see if he can get his medication set up for a 90 day supply, right now patient is set up for a 30 day supply.  busPIRone (BUSPAR) 10 MG tablet    Patient has enough medication to last him for 10 days and wanting to see if it can be changed to a 90 day supply for is next refill.    Please advise.

## 2022-10-30 ENCOUNTER — Encounter (HOSPITAL_COMMUNITY): Payer: Self-pay | Admitting: Physician Assistant

## 2022-10-30 ENCOUNTER — Ambulatory Visit (HOSPITAL_COMMUNITY)
Admission: RE | Admit: 2022-10-30 | Discharge: 2022-10-30 | Disposition: A | Discharge status: Home / Self Care | Payer: Medicaid Other | Source: Ambulatory Visit | Attending: Physician Assistant | Admitting: Physician Assistant

## 2022-10-30 ENCOUNTER — Telehealth: Payer: Self-pay | Admitting: Nurse Practitioner

## 2022-10-30 VITALS — BP 120/82 | HR 47 | Ht 66.0 in | Wt 167.2 lb

## 2022-10-30 DIAGNOSIS — D6869 Other thrombophilia: Secondary | ICD-10-CM | POA: Insufficient documentation

## 2022-10-30 DIAGNOSIS — I5020 Unspecified systolic (congestive) heart failure: Secondary | ICD-10-CM | POA: Insufficient documentation

## 2022-10-30 DIAGNOSIS — I4892 Unspecified atrial flutter: Secondary | ICD-10-CM | POA: Diagnosis not present

## 2022-10-30 DIAGNOSIS — I110 Hypertensive heart disease with heart failure: Secondary | ICD-10-CM | POA: Insufficient documentation

## 2022-10-30 DIAGNOSIS — Z7901 Long term (current) use of anticoagulants: Secondary | ICD-10-CM | POA: Insufficient documentation

## 2022-10-30 DIAGNOSIS — I480 Paroxysmal atrial fibrillation: Secondary | ICD-10-CM | POA: Insufficient documentation

## 2022-10-30 DIAGNOSIS — F4310 Post-traumatic stress disorder, unspecified: Secondary | ICD-10-CM | POA: Diagnosis not present

## 2022-10-30 MED ORDER — FLECAINIDE ACETATE 50 MG PO TABS: 50.0000 mg | ORAL_TABLET | Freq: Two times a day (BID) | ORAL | 3 refills | Status: DC

## 2022-10-30 NOTE — Progress Notes (Signed)
Primary Care Physician: Jon Billings, NP Primary Cardiologist: Dr End Primary Electrophysiologist: Dr Quentin Ore Referring Physician: Dr Lahoma Crocker is a 47 y.o. male with a history of HTN, PTSD, systolic dysfunction, atrial flutter, atrial fibrillation who presents for follow up in the [facility]. Patient is on Eliquis for a CHADS2VASC score of 2. He underwent afib and flutter ablation on 10/21/21.   On follow up today, patient reports that over the past few months he has had 3 symptomatic afib episodes. EMS was called for one episode when his HR was in the 160's, he declined going to the ED at that time and his afib spontaneously converted. No bleeding issues on anticoagulation.   Today, he denies symptoms of palpitations, chest pain, shortness of breath, orthopnea, PND, lower extremity edema, dizziness, presyncope, syncope, snoring, daytime somnolence, bleeding, or neurologic sequela. The patient is tolerating medications without difficulties and is otherwise without complaint today.    Atrial Fibrillation Risk Factors:  he does not have symptoms or diagnosis of sleep apnea. he does not have a history of rheumatic fever.   he has a BMI of Body mass index is 26.99 kg/m.Marland Kitchen Filed Weights   10/30/22 1354  Weight: 75.8 kg    Family History  Problem Relation Age of Onset   Hypertension Mother    Atrial fibrillation Mother    Hypertension Father    Heart disease Maternal Grandmother    Hypertension Maternal Grandmother    Hypertension Paternal Grandfather    Coronary artery disease Paternal Grandfather    Other Son        Trisomy 21     Atrial Fibrillation Management history:  Previous antiarrhythmic drugs: none Previous cardioversions: none Previous ablations: 10/21/21 CHADS2VASC score: 2 Anticoagulation history: Eliquis   Past Medical History:  Diagnosis Date   Anxiety    Depression    Dysrhythmia    Hypertension     Mineral deficiency    Paroxysmal atrial fibrillation (HCC)    PTSD (post-traumatic stress disorder)    Past Surgical History:  Procedure Laterality Date   APPENDECTOMY     ATRIAL FIBRILLATION ABLATION N/A 10/21/2021   Procedure: ATRIAL FIBRILLATION ABLATION;  Surgeon: Vickie Epley, MD;  Location: [facility];  Service: Cardiovascular;  Laterality: N/A;   EYE SURGERY     laceration to pupil   FRACTURE SURGERY Left    Rod- which was replaced due to rejection of original rod   SPINAL FUSION     SPLENECTOMY, TOTAL     TEE WITHOUT CARDIOVERSION N/A 10/21/2021   Procedure: TRANSESOPHAGEAL ECHOCARDIOGRAM (TEE);  Surgeon: Vickie Epley, MD;  Location: [facility];  Service: Cardiovascular;  Laterality: N/A;    Current Outpatient Medications  Medication Sig Dispense Refill   apixaban (ELIQUIS) 5 MG TABS tablet Take 1 tablet by mouth 2 (two) times daily.     busPIRone (BUSPAR) 10 MG tablet Take 1 tablet (10 mg total) by mouth 2 (two) times daily. 180 tablet 1   EPINEPHrine 0.3 mg/0.3 mL IJ SOAJ injection Inject 0.3 mg into the muscle as needed for anaphylaxis. 1 each 1   metoprolol tartrate (LOPRESSOR) 50 MG tablet Take 1 tablet (50 mg total) by mouth 2 (two) times daily. 180 tablet 3   rOPINIRole (REQUIP) 1 MG tablet Take 1 tablet (1 mg total) by mouth at bedtime. 90 tablet 1   spironolactone (ALDACTONE) 25 MG tablet Take 0.5 tablets (12.5 mg total) by  mouth daily. 30 tablet 6   traZODone (DESYREL) 50 MG tablet Take 1-2 tablets (50-100 mg total) by mouth at bedtime as needed. for sleep 180 tablet 1   venlafaxine XR (EFFEXOR XR) 75 MG 24 hr capsule Take 1 capsule (75 mg total) by mouth daily with breakfast. 90 capsule 1   VENTOLIN HFA 108 (90 Base) MCG/ACT inhaler TAKE 2 PUFFS BY MOUTH EVERY 6 HOURS AS NEEDED FOR WHEEZE OR SHORTNESS OF BREATH 18 each 0   No current facility-administered medications for this encounter.    Allergies  Allergen Reactions   Aspirin  Anaphylaxis and Shortness Of Breath   Bee Venom Anaphylaxis   Penicillins Rash and Anaphylaxis   Sulfa Antibiotics Anaphylaxis    Social History   Socioeconomic History   Marital status: Single    Spouse name: Not on file   Number of children: Not on file   Years of education: Not on file   Highest education level: Not on file  Occupational History   Not on file  Tobacco Use   Smoking status: Former    Packs/day: 1.00    Years: 25.00    Total pack years: 25.00    Types: Cigarettes   Smokeless tobacco: Never   Tobacco comments:    Former smoker 11/18/21  Vaping Use   Vaping Use: Never used  Substance and Sexual Activity   Alcohol use: Not Currently   Drug use: Yes    Frequency: 7.0 times per week    Types: Marijuana    Comment: daily 10/30/22   Sexual activity: Yes  Other Topics Concern   Not on file  Social History Narrative   Not on file   Social Determinants of Health   Financial Resource Strain: Not on file  Food Insecurity: Not on file  Transportation Needs: Not on file  Physical Activity: Not on file  Stress: Not on file  Social Connections: Not on file  Intimate Partner Violence: Not on file     ROS- All systems are reviewed and negative except as per the HPI above.  Physical Exam: Vitals:   10/30/22 1354  BP: 120/82  Pulse: (!) 47  Weight: 75.8 kg  Height: 5\' 6"  (1.676 m)    GEN- The patient is a well appearing male, alert and oriented x 3 today.   HEENT-head normocephalic, atraumatic, sclera clear, conjunctiva pink, hearing intact, trachea midline. Lungs- Clear to ausculation bilaterally, normal work of breathing Heart- Regular rate and rhythm, bradycardia, no murmurs, rubs or gallops  GI- soft, NT, ND, + BS Extremities- no clubbing, cyanosis, or edema MS- no significant deformity or atrophy Skin- no rash or lesion Psych- euthymic mood, full affect Neuro- strength and sensation are intact   Wt Readings from Last 3 Encounters:   10/30/22 75.8 kg  09/08/22 74.5 kg  08/20/22 74.1 kg    EKG today demonstrates  SB Vent. rate 47 BPM PR interval 136 ms QRS duration 82 ms QT/QTcB 448/396 ms  Echo 05/22/22 demonstrated   1. Left ventricular ejection fraction, by estimation, is 55 to 60%. The  left ventricle has normal function. The left ventricle has no regional  wall motion abnormalities.   2. Right ventricular systolic function is normal. The right ventricular  size is normal.   3. The mitral valve is normal in structure. No evidence of mitral valve  regurgitation.   4. The aortic valve is tricuspid. Aortic valve regurgitation is not  visualized.   Epic records are reviewed at length  today  CHA2DS2-VASc Score = 2  The patient's score is based upon: CHF History: 1 HTN History: 1 Diabetes History: 0 Stroke History: 0 Vascular Disease History: 0 Age Score: 0 Gender Score: 0       ASSESSMENT AND PLAN: 1. Paroxysmal Atrial Fibrillation/atrial flutter The patient's CHA2DS2-VASc score is 2, indicating a 2.2% annual risk of stroke.   S/p afib and flutter ablation 10/21/21 Patient having symptomatic episodes of afib. We discussed rhythm control options today including AAD vs repeat ablation. He would like to avoid surgery if possible. Will start flecainide 50 mg BID. Recheck ECG next week and then a TST.  Continue Eliquis 5 mg BID  Continue Lopressor 25 mg BID  2. Secondary Hypercoagulable State (ICD10:  D68.69) The patient is at significant risk for stroke/thromboembolism based upon his CHA2DS2-VASc Score of 2.  Continue Apixaban (Eliquis).   3. HTN Stable, no changes today.  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 EF normalized on echo 05/22/22 Fluid status appears stable.    Follow up with Dr End as scheduled. AF clinic next week for ECG.    [name] PA-C [facility] [address] [place], [place] [address] [phone] 10/30/2022 2:15 PM

## 2022-10-30 NOTE — Patient Instructions (Signed)
Start Flecainide 50mg twice a day 

## 2022-10-30 NOTE — Telephone Encounter (Signed)
Contacted [facility] because RX in chart is written as a 90 day supply. Pharmacist states that patient is only given 30 days at a time due to insurance. Patient's insurance is only allowing a 30 day supply per fill.    Called and notified patient of the above. Patient states he is going to call and talk to his insurance and the pharmacy regarding this.

## 2022-10-30 NOTE — Telephone Encounter (Signed)
Patient is calling to see if PCP could call him in a prescription for [place] for his anxiety.  Please advise.

## 2022-10-30 NOTE — Telephone Encounter (Signed)
LVM asking patient to call back to schedule an appointment 

## 2022-10-31 ENCOUNTER — Ambulatory Visit: Payer: Self-pay

## 2022-10-31 NOTE — Telephone Encounter (Signed)
    Pt. Asking if he should hold Tambocor until appointment.Pharmacist told him the drug interaction "could stop my heart."  Chief Complaint: Pt. Told by pharmacy that tambocor is not compatible with Trazodone, "I have to take the trazodone to sleep Symptoms: n/a Frequency: n/a Pertinent Negatives: Patient denies  Disposition: [] ED /[] Urgent Care (no appt availability in office) / [] Appointment(In office/virtual)/ []  [facility]/ [] Home Care/ [] Refused Recommended Disposition /[] [facility]/ [x]  Follow-up with PCP Additional Notes: Please advise pt.  Answer Assessment - Initial Assessment Questions 1. NAME of MEDICINE: "What medicine(s) are you calling about?"     Tambocor 2. QUESTION: "What is your question?" (e.g., double dose of medicine, side effect)     Can't take it with Trazodone 3. PRESCRIBER: "Who prescribed the medicine?" Reason: if prescribed by specialist, call should be referred to that group.     A-fib clinic 4. SYMPTOMS: "Do you have any symptoms?" If Yes, ask: "What symptoms are you having?"  "How bad are the symptoms (e.g., mild, moderate, severe)     N/a 5. PREGNANCY:  "Is there any chance that you are pregnant?" "When was your last menstrual period?"     N/a  Protocols used: Medication Question Call-A-AH

## 2022-10-31 NOTE — Telephone Encounter (Signed)
Pt scheduled 12/3

## 2022-11-03 NOTE — Telephone Encounter (Signed)
Patient notified of Karen's message and verbalized understanding.  

## 2022-11-03 NOTE — Telephone Encounter (Signed)
Please let patient know that I spoke with the PA with cardiology.  We are aware of the interaction potentially prolonging part of his heart rhythm.  This is why he was scheduled to come back and check his EKG on Thursday.  At this time, he should continue to take the two medications.

## 2022-11-03 NOTE — Progress Notes (Signed)
BP 139/85 (BP Location: Left Arm, Cuff Size: Normal)   Pulse (!) 46   Temp 97.7 F (36.5 C) (Oral)   Wt 164 lb 8 oz (74.6 kg)   SpO2 97%   BMI 26.55 kg/m    Subjective:    Patient ID: Patrick Short, male    DOB: June 10, 1976, 47 y.o.   MRN: [id number]  HPI: Patrick Short is a 47 y.o. male  Chief Complaint  Patient presents with   Depression   Hypertension   Anxiety   Patient states he has the shakes really bad.  He is burning up one minute and freezing the next.  This has been going on for about 2-3 weeks.  He is also having a lot of fatigue.   Patient states he didn't start the Flecainide till yesterday.  He didn't take the Trazodone last night and therefore didn't sleep.    ANXIETY/DEPRESSION Patient states he is still having a lot of anxiety.  Wondering if there is something he can take PRN for anxiety.  He is still taking Effexor 75mg  daily.  Currently taking Buspar BID.  Feels like the combination is working well for him.  Feels more even.  Feels like the trazadone works sometimes and doesn't work other times. He is getting 6-7 hours per night.    [facility] Office Visit from 11/04/2022 in [facility]  PHQ-9 Total Score 4         11/04/2022    9:22 AM 09/08/2022    9:53 AM 08/20/2022    1:10 PM 06/06/2022   10:28 AM  GAD 7 : Generalized Anxiety Score  Nervous, Anxious, on Edge 1 0 1 1  Control/stop worrying 0 0 0 0  Worry too much - different things 0 0 0 0  Trouble relaxing 1 0 1 1  Restless 1 1 1 1   Easily annoyed or irritable 1 0 0 1  Afraid - awful might happen 0 0 0 0  Total GAD 7 Score 4 1 3 4   Anxiety Difficulty Somewhat difficult Not difficult at all Not difficult at all Somewhat difficult     Relevant past medical, surgical, family and social history reviewed and updated as indicated. Interim medical history since our last visit reviewed. Allergies and medications reviewed and updated.  Review of Systems  Constitutional:   Positive for fatigue.  Eyes:  Negative for visual disturbance.  Respiratory:  Negative for shortness of breath.   Cardiovascular:  Negative for chest pain and leg swelling.  Neurological:  Negative for light-headedness and headaches.       Restless leg, hands shaking  Psychiatric/Behavioral:  Positive for dysphoric mood. Negative for suicidal ideas. The patient is nervous/anxious.     Per HPI unless specifically indicated above     Objective:    BP 139/85 (BP Location: Left Arm, Cuff Size: Normal)   Pulse (!) 46   Temp 97.7 F (36.5 C) (Oral)   Wt 164 lb 8 oz (74.6 kg)   SpO2 97%   BMI 26.55 kg/m   Wt Readings from Last 3 Encounters:  11/04/22 164 lb 8 oz (74.6 kg)  10/30/22 167 lb 3.2 oz (75.8 kg)  09/08/22 164 lb 3.2 oz (74.5 kg)    Physical Exam Vitals and nursing note reviewed.  Constitutional:      General: He is not in acute distress.    Appearance: Normal appearance. He is not ill-appearing, toxic-appearing or diaphoretic.  HENT:  Head: Normocephalic.     Right Ear: External ear normal.     Left Ear: External ear normal.     Nose: Nose normal. No congestion or rhinorrhea.     Mouth/Throat:     Mouth: Mucous membranes are moist.  Eyes:     General:        Right eye: No discharge.        Left eye: No discharge.     Extraocular Movements: Extraocular movements intact.     Conjunctiva/sclera: Conjunctivae normal.     Pupils: Pupils are equal, round, and reactive to light.  Cardiovascular:     Rate and Rhythm: Normal rate and regular rhythm.     Heart sounds: No murmur heard. Pulmonary:     Effort: Pulmonary effort is normal. No respiratory distress.     Breath sounds: Normal breath sounds. No wheezing, rhonchi or rales.  Abdominal:     General: Abdomen is flat. Bowel sounds are normal.  Musculoskeletal:     Cervical back: Normal range of motion and neck supple.  Skin:    General: Skin is warm and dry.     Capillary Refill: Capillary refill takes less  than 2 seconds.  Neurological:     General: No focal deficit present.     Mental Status: He is alert and oriented to person, place, and time.  Psychiatric:        Mood and Affect: Mood normal.        Behavior: Behavior normal.        Thought Content: Thought content normal.        Judgment: Judgment normal.     Results for orders placed or performed in visit on 09/08/22  Comp Met (CMET)  Result Value Ref Range   Glucose 68 (L) 70 - 99 mg/dL   BUN 7 6 - 24 mg/dL   Creatinine, Ser 0.87 0.76 - 1.27 mg/dL   eGFR 107 >59 mL/min/1.73   BUN/Creatinine Ratio 8 (L) 9 - 20   Sodium 143 134 - 144 mmol/L   Potassium 3.8 3.5 - 5.2 mmol/L   Chloride 107 (H) 96 - 106 mmol/L   CO2 23 20 - 29 mmol/L   Calcium 9.2 8.7 - 10.2 mg/dL   Total Protein 6.7 6.0 - 8.5 g/dL   Albumin 4.3 4.1 - 5.1 g/dL   Globulin, Total 2.4 1.5 - 4.5 g/dL   Albumin/Globulin Ratio 1.8 1.2 - 2.2   Bilirubin Total <0.2 0.0 - 1.2 mg/dL   Alkaline Phosphatase 80 44 - 121 IU/L   AST 14 0 - 40 IU/L   ALT 18 0 - 44 IU/L  Lipid Profile  Result Value Ref Range   Cholesterol, Total 170 100 - 199 mg/dL   Triglycerides 146 0 - 149 mg/dL   HDL 44 >39 mg/dL   VLDL Cholesterol Cal 26 5 - 40 mg/dL   LDL Chol Calc (NIH) 100 (H) 0 - 99 mg/dL   Chol/HDL Ratio 3.9 0.0 - 5.0 ratio  HIV Antibody (routine testing w rflx)  Result Value Ref Range   HIV Screen 4th Generation wRfx Non Reactive Non Reactive  Hepatitis C Antibody  Result Value Ref Range   Hep C Virus Ab Non Reactive Non Reactive  HgB A1c  Result Value Ref Range   Hgb A1c MFr Bld 5.3 4.8 - 5.6 %   Est. average glucose Bld gHb Est-mCnc 105 mg/dL      Assessment & Plan:   Problem List Items Addressed  This Visit       Cardiovascular and Mediastinum   Essential hypertension    Chronic.  Controlled.  Continue with current medication regimen.  Labs ordered today.  Return to clinic in 6 months for reevaluation.  Call sooner if concerns arise.        Relevant Orders    Comp Met (CMET)   Atrial fibrillation (HCC) - Primary    Chronic.  Ongoing.  Was started on Flecainide last week by cardiology.  Did not start the medication till last night.  Discussed the potential for Prolonged Qtc and reassured patient that Cardiology is monitoring this closely and chose the medication wisely based on his other medications.  Keep follow up appointment with cardiology.        Cardiomyopathy (HCC)    Chronic. Followed by Cardiology.  Continue to collaborate with specialist.      Hypercoagulable state due to paroxysmal atrial fibrillation (HCC)    Chronic.  Controlled.  Continue with current medication regimen of Eliquis 5mg  BID.  Labs ordered today.  Return to clinic in 6 months for reevaluation.  Call sooner if concerns arise.          Other   Depression    Chronic.  Controlled.  Continue with current medication regimen of Effexor.  Labs ordered today.  Return to clinic in 6 months for reevaluation.  Call sooner if concerns arise.        Relevant Medications   busPIRone (BUSPAR) 10 MG tablet   Anxiety    Chronic. Not well controlled.  Will increase Buspar to 20mg  TID.  Discussed with patient that middle of the day dose can be take as needed for increased anxiety.  Does not have to take it everyday.  Follow up in 1 month.  Call sooner if concerns arise.       Relevant Medications   busPIRone (BUSPAR) 10 MG tablet   Elevated glucose    Labs ordered at visit today.  Will make recommendations based on lab results.        Relevant Orders   HgB A1c   Other Visit Diagnoses     Other fatigue       Labs ordered today. Will make recommendations based on lab results.   Relevant Orders   Thyroid Panel With TSH   Thyroid peroxidase antibody   B12   Vitamin D (25 hydroxy)        Follow up plan: Return in about 3 months (around 02/03/2023) for HTN, HLD, DM2 FU.

## 2022-11-04 ENCOUNTER — Ambulatory Visit (INDEPENDENT_AMBULATORY_CARE_PROVIDER_SITE_OTHER): Payer: Medicaid Other | Admitting: Nurse Practitioner

## 2022-11-04 ENCOUNTER — Encounter: Payer: Self-pay | Admitting: Nurse Practitioner

## 2022-11-04 VITALS — BP 139/85 | HR 46 | Temp 97.7°F | Wt 164.5 lb

## 2022-11-04 DIAGNOSIS — I480 Paroxysmal atrial fibrillation: Secondary | ICD-10-CM | POA: Diagnosis not present

## 2022-11-04 DIAGNOSIS — I4819 Other persistent atrial fibrillation: Secondary | ICD-10-CM | POA: Diagnosis not present

## 2022-11-04 DIAGNOSIS — I10 Essential (primary) hypertension: Secondary | ICD-10-CM | POA: Diagnosis not present

## 2022-11-04 DIAGNOSIS — F3289 Other specified depressive episodes: Secondary | ICD-10-CM

## 2022-11-04 DIAGNOSIS — R7309 Other abnormal glucose: Secondary | ICD-10-CM

## 2022-11-04 DIAGNOSIS — F419 Anxiety disorder, unspecified: Secondary | ICD-10-CM | POA: Diagnosis not present

## 2022-11-04 DIAGNOSIS — I429 Cardiomyopathy, unspecified: Secondary | ICD-10-CM | POA: Diagnosis not present

## 2022-11-04 DIAGNOSIS — R5383 Other fatigue: Secondary | ICD-10-CM | POA: Diagnosis not present

## 2022-11-04 DIAGNOSIS — G2581 Restless legs syndrome: Secondary | ICD-10-CM

## 2022-11-04 DIAGNOSIS — D6869 Other thrombophilia: Secondary | ICD-10-CM | POA: Diagnosis not present

## 2022-11-04 DIAGNOSIS — E7800 Pure hypercholesterolemia, unspecified: Secondary | ICD-10-CM

## 2022-11-04 MED ORDER — BUSPIRONE HCL 10 MG PO TABS: 10.0000 mg | ORAL_TABLET | Freq: Three times a day (TID) | ORAL | 1 refills | Status: DC

## 2022-11-04 NOTE — Assessment & Plan Note (Signed)
Chronic.  Controlled.  Continue with current medication regimen.  Labs ordered today.  Return to clinic in 6 months for reevaluation.  Call sooner if concerns arise.  ? ?

## 2022-11-04 NOTE — Assessment & Plan Note (Signed)
Chronic. Followed by Cardiology.  Continue to collaborate with specialist.  

## 2022-11-04 NOTE — Assessment & Plan Note (Signed)
Labs ordered at visit today.  Will make recommendations based on lab results.   

## 2022-11-04 NOTE — Assessment & Plan Note (Signed)
Chronic. Not well controlled.  Will increase Buspar to 20mg  TID.  Discussed with patient that middle of the day dose can be take as needed for increased anxiety.  Does not have to take it everyday.  Follow up in 1 month.  Call sooner if concerns arise.

## 2022-11-04 NOTE — Assessment & Plan Note (Signed)
Chronic.  Controlled.  Continue with current medication regimen of Eliquis 5mg  BID.  Labs ordered today.  Return to clinic in 6 months for reevaluation.  Call sooner if concerns arise.

## 2022-11-04 NOTE — Assessment & Plan Note (Signed)
Chronic.  Controlled.  Continue with current medication regimen of Effexor.  Labs ordered today.  Return to clinic in 6 months for reevaluation.  Call sooner if concerns arise.   

## 2022-11-04 NOTE — Assessment & Plan Note (Signed)
Chronic.  Ongoing.  Was started on Flecainide last week by cardiology.  Did not start the medication till last night.  Discussed the potential for Prolonged Qtc and reassured patient that Cardiology is monitoring this closely and chose the medication wisely based on his other medications.  Keep follow up appointment with cardiology.

## 2022-11-05 LAB — COMPREHENSIVE METABOLIC PANEL
ALT: 27 IU/L (ref 0–44)
AST: 23 IU/L (ref 0–40)
Albumin/Globulin Ratio: 1.9 (ref 1.2–2.2)
Albumin: 5 g/dL (ref 4.1–5.1)
Alkaline Phosphatase: 76 IU/L (ref 44–121)
BUN/Creatinine Ratio: 12 (ref 9–20)
BUN: 12 mg/dL (ref 6–24)
Bilirubin Total: 0.8 mg/dL (ref 0.0–1.2)
CO2: 21 mmol/L (ref 20–29)
Calcium: 10 mg/dL (ref 8.7–10.2)
Chloride: 99 mmol/L (ref 96–106)
Creatinine, Ser: 0.98 mg/dL (ref 0.76–1.27)
Globulin, Total: 2.7 g/dL (ref 1.5–4.5)
Glucose: 92 mg/dL (ref 70–99)
Potassium: 4.2 mmol/L (ref 3.5–5.2)
Sodium: 140 mmol/L (ref 134–144)
Total Protein: 7.7 g/dL (ref 6.0–8.5)
eGFR: 96 mL/min/{1.73_m2} (ref >59)

## 2022-11-05 LAB — HEMOGLOBIN A1C
Est. average glucose Bld gHb Est-mCnc: 105 mg/dL
Hgb A1c MFr Bld: 5.3 % (ref 4.8–5.6)

## 2022-11-05 LAB — THYROID PEROXIDASE ANTIBODY: Thyroperoxidase Ab SerPl-aCnc: 26 IU/mL (ref 0–34)

## 2022-11-05 LAB — THYROID PANEL WITH TSH
Free Thyroxine Index: 2.5 (ref 1.2–4.9)
T3 Uptake Ratio: 29 % (ref 24–39)
T4, Total: 8.7 ug/dL (ref 4.5–12.0)
TSH: 1.78 u[IU]/mL (ref 0.450–4.500)

## 2022-11-05 LAB — VITAMIN D 25 HYDROXY (VIT D DEFICIENCY, FRACTURES): Vit D, 25-Hydroxy: 31.3 ng/mL (ref 30.0–100.0)

## 2022-11-05 LAB — VITAMIN B12: Vitamin B-12: 636 pg/mL (ref 232–1245)

## 2022-11-05 NOTE — Progress Notes (Signed)
Hi Patrick Short. It was nice to see you yesterday.  Your lab work looks good.  No reason for him shaking.  However, I feel like it is related to anxiety.  Let's work to get that under control and see if they improve.  No other concerns at this time. Continue with your current medication regimen.  Follow up as discussed.  Please let me know if you have any questions.

## 2022-11-06 ENCOUNTER — Ambulatory Visit (HOSPITAL_COMMUNITY)
Admission: RE | Admit: 2022-11-06 | Discharge: 2022-11-06 | Disposition: A | Discharge status: Home / Self Care | Payer: Medicaid Other | Source: Ambulatory Visit | Attending: Physician Assistant | Admitting: Physician Assistant

## 2022-11-06 DIAGNOSIS — I480 Paroxysmal atrial fibrillation: Secondary | ICD-10-CM | POA: Insufficient documentation

## 2022-11-06 DIAGNOSIS — I4819 Other persistent atrial fibrillation: Secondary | ICD-10-CM

## 2022-11-06 DIAGNOSIS — I491 Atrial premature depolarization: Secondary | ICD-10-CM | POA: Insufficient documentation

## 2022-11-06 DIAGNOSIS — Z79899 Other long term (current) drug therapy: Secondary | ICD-10-CM | POA: Diagnosis not present

## 2022-11-06 NOTE — Addendum Note (Signed)
Encounter addended by: Juluis Mire, RN on: 11/06/2022 2:06 PM  Actions taken: Order list changed, Diagnosis association updated

## 2022-11-06 NOTE — Progress Notes (Signed)
Patient returns for ECG after starting flecainide. ECG shows:  SB, PACs Vent. rate 56 BPM PR interval 148 ms QRS duration 82 ms QT/QTcB 428/413 ms  Patient feeling well on present dose with no interim episodes of afib. Intervals appropriate. Will arrange for TST. Follow up in the AF clinic in 3 months.

## 2022-11-09 ENCOUNTER — Emergency Department
Admission: EM | Admit: 2022-11-09 | Discharge: 2022-11-10 | Disposition: A | Discharge status: Home / Self Care | Payer: Medicaid Other | Attending: Emergency Medicine | Admitting: Emergency Medicine

## 2022-11-09 ENCOUNTER — Other Ambulatory Visit: Payer: Self-pay

## 2022-11-09 DIAGNOSIS — T7840XA Allergy, unspecified, initial encounter: Secondary | ICD-10-CM

## 2022-11-09 DIAGNOSIS — R0689 Other abnormalities of breathing: Secondary | ICD-10-CM | POA: Diagnosis not present

## 2022-11-09 DIAGNOSIS — L299 Pruritus, unspecified: Secondary | ICD-10-CM | POA: Diagnosis not present

## 2022-11-09 DIAGNOSIS — L509 Urticaria, unspecified: Secondary | ICD-10-CM | POA: Diagnosis not present

## 2022-11-09 DIAGNOSIS — R059 Cough, unspecified: Secondary | ICD-10-CM | POA: Diagnosis not present

## 2022-11-09 MED ORDER — METHYLPREDNISOLONE SODIUM SUCC 125 MG IJ SOLR
125.0000 mg | Freq: Once | INTRAMUSCULAR | Status: AC
  Administered 2022-11-09: 125 mg via INTRAVENOUS
  Filled 2022-11-09: qty 2

## 2022-11-09 NOTE — ED Provider Notes (Incomplete)
[facility] Provider Note    Event Date/Time   First MD Initiated Contact with Patient 11/09/22 2306     (approximate)   History   Allergic Reaction   HPI  Patrick Short is a 47 y.o. male who presents to the emergency department today after apparent allergic reaction.  The patient used partners body wash and started breaking out in hives.  He did feel some tightening of his throat.  Patient does have known allergies.  Took Benadryl at home.  EMS gave EpiPen.  Patient is feeling better at the time my exam. Denies any recent illness.      Physical Exam   Triage Vital Signs: ED Triage Vitals  Enc Vitals Group     BP 11/09/22 2240 (!) 135/97     Pulse Rate 11/09/22 2240 (!) 103     Resp 11/09/22 2240 (!) 21     Temp 11/09/22 2240 97.9 F (36.6 C)     Temp Source 11/09/22 2240 Oral     SpO2 11/09/22 2240 95 %     Weight --      Height --      Head Circumference --      Peak Flow --      Pain Score 11/09/22 2246 0     Pain Loc --      Pain Edu? --      Excl. in [place]? --     Most recent vital signs: Vitals:   11/09/22 2240  BP: (!) 135/97  Pulse: (!) 103  Resp: (!) 21  Temp: 97.9 F (36.6 C)  SpO2: 95%   General: Awake, alert, oriented. CV:  Good peripheral perfusion. Regular rate and rhythm. Resp:  Normal effort. Lungs clear. Abd:  No distention.     ED Results / Procedures / Treatments   Labs (all labs ordered are listed, but only abnormal results are displayed) Labs Reviewed - No data to display   EKG  ***   RADIOLOGY *** {USE THE WORD "INTERPRETED"!! You MUST document your own interpretation of imaging, as well as the fact that you reviewed the radiologist's report!:1}   PROCEDURES:  Critical Care performed: {CriticalCareYesNo:19197::"Yes, see critical care procedure note(s)","No"}  Procedures   MEDICATIONS ORDERED IN ED: Medications - No data to display   IMPRESSION / MDM / [place] / ED COURSE  I  reviewed the triage vital signs and the nursing notes.                              Differential diagnosis includes, but is not limited to, ***  Patient's presentation is most consistent with {EM COPA:27473}  *** {If the patient is on the monitor, remove the brackets and asterisks on the sentence below and remember to document it as a Procedure as well. Otherwise delete the sentence below:1} {**The patient is on the cardiac monitor to evaluate for evidence of arrhythmia and/or significant heart rate changes.**} {Remember to include, when applicable, any/all of the following data: independent review of imaging independent review of labs (comment specifically on pertinent positives and negatives) review of specific prior hospitalizations, PCP/specialist notes, etc. discuss meds given and prescribed document any discussion with consultants (including hospitalists) any clinical decision tools you used and why (PECARN, NEXUS, etc.) did you consider admitting the patient? document social determinants of health affecting patient's care (homelessness, inability to follow up in a timely fashion, etc) document any  pre-existing conditions increasing risk on current visit (e.g. diabetes and HTN increasing danger of high-risk chest pain/ACS) describes what meds you gave (especially parenteral) and why any other interventions?:1}     FINAL CLINICAL IMPRESSION(S) / ED DIAGNOSES   Final diagnoses:  None     Rx / DC Orders   ED Discharge Orders     None        Note:  This document was prepared using Dragon voice recognition software and may include unintentional dictation errors.

## 2022-11-09 NOTE — ED Provider Notes (Signed)
[facility] Provider Note    Event Date/Time   First MD Initiated Contact with Patient 11/09/22 2306     (approximate)   History   Allergic Reaction   HPI  Patrick Short is a 47 y.o. male who presents to the emergency department today after apparent allergic reaction.  The patient used partners body wash and started breaking out in hives.  He did feel some tightening of his throat.  Patient does have known allergies.  Took Benadryl at home.  EMS gave EpiPen.  Patient is feeling better at the time my exam. Denies any recent illness.      Physical Exam   Triage Vital Signs: ED Triage Vitals  Enc Vitals Group     BP 11/09/22 2240 (!) 135/97     Pulse Rate 11/09/22 2240 (!) 103     Resp 11/09/22 2240 (!) 21     Temp 11/09/22 2240 97.9 F (36.6 C)     Temp Source 11/09/22 2240 Oral     SpO2 11/09/22 2240 95 %     Weight --      Height --      Head Circumference --      Peak Flow --      Pain Score 11/09/22 2246 0     Pain Loc --      Pain Edu? --      Excl. in [place]? --     Most recent vital signs: Vitals:   11/09/22 2240  BP: (!) 135/97  Pulse: (!) 103  Resp: (!) 21  Temp: 97.9 F (36.6 C)  SpO2: 95%   General: Awake, alert, oriented. CV:  Good peripheral perfusion. Regular rate and rhythm. Resp:  Normal effort. Lungs clear. Abd:  No distention.     ED Results / Procedures / Treatments   Labs (all labs ordered are listed, but only abnormal results are displayed) Labs Reviewed - No data to display   EKG  None   RADIOLOGY None   PROCEDURES:  Critical Care performed: No  Procedures   MEDICATIONS ORDERED IN ED: Medications - No data to display   IMPRESSION / MDM / [place] / ED COURSE  I reviewed the triage vital signs and the nursing notes.                              Differential diagnosis includes, but is not limited to, allergic reaction, viral rash  Patient's presentation is most consistent  with acute presentation with potential threat to life or bodily function.   Patient presented to the emergency department today because of concerns for allergic type reaction.  He was given EpiPen by EMS.  While here in the emergency department he did have some slight worsening of his breathing so was given another shot of epinephrine as well as a breathing treatment.  He then started feeling improved.  He did request discharge home which I think is reasonable at this time.  Will give patient prescription for prednisone for the next few days.  He states that he has EpiPen's at home and does not require any further EpiPen prescriptions at this time.  Additionally recommend the patient take Benadryl for the next 24 hours.   FINAL CLINICAL IMPRESSION(S) / ED DIAGNOSES   Final diagnoses:  Allergic reaction, initial encounter     Note:  This document was prepared using Dragon voice recognition software and may  include unintentional dictation errors.    Nance Pear, MD 11/10/22 [phone]

## 2022-11-09 NOTE — ED Triage Notes (Signed)
Patient BIB EMS for evaluation of allergic reaction.  Patient reports taking a shower and using his wife's body wash.  When out of shower, pt noticed hives, "red skin," and itching.  Took Benadryl 100 mg PO at home.  EMS gave Epi and Pepcid 20 mg IV.

## 2022-11-10 ENCOUNTER — Telehealth: Payer: Self-pay | Admitting: Cardiology

## 2022-11-10 MED ORDER — EPINEPHRINE 0.3 MG/0.3ML IJ SOAJ
0.3000 mg | Freq: Once | INTRAMUSCULAR | Status: AC
  Administered 2022-11-10: 0.3 mg via INTRAMUSCULAR
  Filled 2022-11-10: qty 0.3

## 2022-11-10 MED ORDER — PREDNISONE 20 MG PO TABS: 40.0000 mg | ORAL_TABLET | Freq: Every day | ORAL | 0 refills | Status: DC

## 2022-11-10 MED ORDER — IPRATROPIUM-ALBUTEROL 0.5-2.5 (3) MG/3ML IN SOLN
3.0000 mL | Freq: Once | RESPIRATORY_TRACT | Status: AC
  Administered 2022-11-10: 3 mL via RESPIRATORY_TRACT
  Filled 2022-11-10: qty 3

## 2022-11-10 NOTE — Discharge Instructions (Signed)
Please seek medical attention for any high fevers, chest pain, shortness of breath, change in behavior, persistent vomiting, bloody stool or any other new or concerning symptoms.  

## 2022-11-10 NOTE — Telephone Encounter (Signed)
Patrick Short sent inbasket message stating that pt would like to change from Dr. Quentin Ore to Dr. Curt Bears. Both providers said okay.

## 2022-11-10 NOTE — ED Notes (Signed)
Discharge instructions explained to patient and family at this time. Patient and family state they understand and agree.   

## 2022-11-10 NOTE — ED Notes (Signed)
Pt states he feels like it is hard to breathe. MD made aware. No distress noted.

## 2022-11-11 ENCOUNTER — Ambulatory Visit: Payer: Medicaid Other | Attending: Physician Assistant

## 2022-11-11 ENCOUNTER — Encounter (HOSPITAL_COMMUNITY): Payer: Self-pay | Admitting: *Deleted

## 2022-11-11 DIAGNOSIS — I480 Paroxysmal atrial fibrillation: Secondary | ICD-10-CM | POA: Diagnosis not present

## 2022-11-11 LAB — EXERCISE TOLERANCE TEST
Angina Index: 0
Duke Treadmill Score: 9
Estimated workload: 10.1
Exercise duration (min): 9 min
Exercise duration (sec): 0 s
MPHR: 173 {beats}/min
Peak HR: 130 {beats}/min
Percent HR: 75 %
RPE: 15
Rest HR: 54 {beats}/min
ST Depression (mm): 0 mm

## 2022-11-11 NOTE — Progress Notes (Addendum)
Discharge Note:  Patient denies SI/HI/AVH at this time. Discharge instructions, AVS, prescriptions, and transition record gone over with patient. Patient agrees to comply with medication management, follow-up visit, and outpatient therapy. Patient belongings(see belongings sheet)- $76.00 and Passport returned to patient as well. Patient questions and concerns addressed and answered. Patient ambulatory off unit. Patient discharged to home in cab.

## 2022-11-12 ENCOUNTER — Telehealth: Payer: Self-pay | Admitting: Licensed Clinical Social Worker

## 2022-11-12 NOTE — Patient Outreach (Signed)
Transition Care Management Unsuccessful Follow-up Telephone Call  Date of discharge and from where:  11/09/22 from [facility]  Attempts:  1st Attempt  Reason for unsuccessful TCM follow-up call:  Left voice message
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
## 2022-11-13 ENCOUNTER — Encounter: Payer: Self-pay | Admitting: Internal Medicine

## 2022-11-13 ENCOUNTER — Telehealth: Payer: Self-pay | Admitting: Licensed Clinical Social Worker

## 2022-11-13 ENCOUNTER — Ambulatory Visit: Payer: Medicaid Other | Attending: Internal Medicine | Admitting: Internal Medicine

## 2022-11-13 VITALS — BP 100/60 | HR 59 | Ht 66.0 in | Wt 174.5 lb

## 2022-11-13 DIAGNOSIS — I480 Paroxysmal atrial fibrillation: Secondary | ICD-10-CM

## 2022-11-13 DIAGNOSIS — Z79899 Other long term (current) drug therapy: Secondary | ICD-10-CM

## 2022-11-13 DIAGNOSIS — I429 Cardiomyopathy, unspecified: Secondary | ICD-10-CM | POA: Diagnosis not present

## 2022-11-13 DIAGNOSIS — I10 Essential (primary) hypertension: Secondary | ICD-10-CM

## 2022-11-13 NOTE — Patient Outreach (Addendum)
Transition Care Management Follow-up Telephone Call Date of discharge and from where: 11/09/22 from [facility] ED How have you been since you were released from the hospital? I have been doing better Any questions or concerns? No  Items Reviewed: Did the pt receive and understand the discharge instructions provided? Yes  Medications obtained and verified? Yes  Other? No  Any new allergies since your discharge?  ED visit was related to an allergic reaction and EpiPens were provided  Dietary orders reviewed? No Do you have support at home? Yes   Home Care and Equipment/Supplies: Were home health services ordered? no If so, what is the name of the agency? na  Has the agency set up a time to come to the patient's home? not applicable Were any new equipment or medical supplies ordered?  No What is the name of the medical supply agency? na Were you able to get the supplies/equipment? not applicable Do you have any questions related to the use of the equipment or supplies? No  Functional Questionnaire: (I = Independent and D = Dependent) ADLs: I  Bathing/Dressing- I  Meal Prep- I  Eating- I  Maintaining continence- I  Transferring/Ambulation- I  Managing Meds- I  Follow up appointments reviewed:  PCP Hospital f/u appt confirmed? Yes  Already completed [facility] f/u appt confirmed? Yes for AF office visit Are transportation arrangements needed? No  If their condition worsens, is the pt aware to call PCP or go to the Emergency Dept.? Yes Was the patient provided with contact information for the PCP's office or ED? Yes Was to pt encouraged to call back with questions or concerns? Yes  Eula Fried, BSW, MSW, [employer] Managed Medicaid [email or web address] Phone: [phone]

## 2022-11-13 NOTE — Patient Instructions (Signed)
Medication Instructions:  Your physician recommends the following medication changes.  STOP TAKING: Spironolactone 12.5 mg daily   *If you need a refill on your cardiac medications before your next appointment, please call your pharmacy*   Lab Work: Your provider would like for you to return in 4-6 weeks to have the following labs drawn: (BMP).   Please go to the [facility] entrance and check in at the front desk.  You do not need an appointment.  They are open from 7am-6 pm.  You will not need to be fasting.  If you have labs (blood work) drawn today and your tests are completely normal, you will receive your results only by: [place] (if you have MyChart) OR A paper copy in the mail If you have any lab test that is abnormal or we need to change your treatment, we will call you to review the results.   Testing/Procedures: None ordered today   Follow-Up: At [facility], you and your health needs are our priority.  As part of our continuing mission to provide you with exceptional heart care, we have created designated Provider Care Teams.  These Care Teams include your primary Cardiologist (physician) and Advanced Practice Providers (APPs -  Physician Assistants and Nurse Practitioners) who all work together to provide you with the care you need, when you need it.  We recommend signing up for the patient portal called "MyChart".  Sign up information is provided on this After Visit Summary.  MyChart is used to connect with patients for Virtual Visits (Telemedicine).  Patients are able to view lab/test results, encounter notes, upcoming appointments, etc.  Non-urgent messages can be sent to your provider as well.   To learn more about what you can do with MyChart, go to [email or web address].    Your next appointment:   6 month(s)  Provider:   You may see Nelva Bush, MD or one of the following Advanced Practice Providers on your designated Care Team:    Murray Hodgkins, NP Christell Faith, PA-C Cadence Kathlen Mody, PA-C Gerrie Nordmann, NP

## 2022-11-21 ENCOUNTER — Ambulatory Visit: Payer: Medicaid Other | Admitting: Internal Medicine

## 2022-12-04 NOTE — Progress Notes (Signed)
BP 114/72   Pulse 85   Temp 98.2 F (36.8 C) (Oral)   Wt 169 lb 1.6 oz (76.7 kg)   SpO2 95%   BMI 27.29 kg/m    Subjective:    Patient ID: Patrick Short, male    DOB: August 09, 1976, 47 y.o.   MRN: [id number]  HPI: Patrick Short is a 47 y.o. male  Chief Complaint  Patient presents with   Anxiety    ANXIETY/DEPRESSION Patient states he is still having episodes where he gets angry easily.  He does feel like taking Buspar TID does help.  One day he took 2 buspar and felt like it helped him.  He is still taking the Effexor.     [place] Office Visit from 12/05/2022 in [place]  PHQ-9 Total Score 1         12/05/2022   10:01 AM 11/04/2022    9:22 AM 09/08/2022    9:53 AM 08/20/2022    1:10 PM  GAD 7 : Generalized Anxiety Score  Nervous, Anxious, on Edge 1 1 0 1  Control/stop worrying 0 0 0 0  Worry too much - different things 0 0 0 0  Trouble relaxing 1 1 0 1  Restless '1 1 1 1  '$ Easily annoyed or irritable 2 1 0 0  Afraid - awful might happen 0 0 0 0  Total GAD 7 Score '5 4 1 3  '$ Anxiety Difficulty Somewhat difficult Somewhat difficult Not difficult at all Not difficult at all     Relevant past medical, surgical, family and social history reviewed and updated as indicated. Interim medical history since our last visit reviewed. Allergies and medications reviewed and updated.  Review of Systems  Eyes:  Negative for visual disturbance.  Respiratory:  Negative for shortness of breath.   Cardiovascular:  Negative for chest pain and leg swelling.  Neurological:  Negative for light-headedness and headaches.       Restless leg, hands shaking  Psychiatric/Behavioral:  Positive for dysphoric mood. Negative for suicidal ideas. The patient is nervous/anxious.     Per HPI unless specifically indicated above     Objective:    BP 114/72   Pulse 85   Temp 98.2 F (36.8 C) (Oral)   Wt 169 lb 1.6 oz (76.7 kg)   SpO2 95%   BMI 27.29 kg/m   Wt  Readings from Last 3 Encounters:  12/05/22 169 lb 1.6 oz (76.7 kg)  11/13/22 174 lb 8 oz (79.2 kg)  11/04/22 164 lb 8 oz (74.6 kg)    Physical Exam Vitals and nursing note reviewed.  Constitutional:      General: He is not in acute distress.    Appearance: Normal appearance. He is not ill-appearing, toxic-appearing or diaphoretic.  HENT:     Head: Normocephalic.     Right Ear: External ear normal.     Left Ear: External ear normal.     Nose: Nose normal. No congestion or rhinorrhea.     Mouth/Throat:     Mouth: Mucous membranes are moist.  Eyes:     General:        Right eye: No discharge.        Left eye: No discharge.     Extraocular Movements: Extraocular movements intact.     Conjunctiva/sclera: Conjunctivae normal.     Pupils: Pupils are equal, round, and reactive to light.  Cardiovascular:     Rate and Rhythm: Normal rate and  regular rhythm.     Heart sounds: No murmur heard. Pulmonary:     Effort: Pulmonary effort is normal. No respiratory distress.     Breath sounds: Normal breath sounds. No wheezing, rhonchi or rales.  Abdominal:     General: Abdomen is flat. Bowel sounds are normal.  Musculoskeletal:     Cervical back: Normal range of motion and neck supple.  Skin:    General: Skin is warm and dry.     Capillary Refill: Capillary refill takes less than 2 seconds.  Neurological:     General: No focal deficit present.     Mental Status: He is alert and oriented to person, place, and time.  Psychiatric:        Mood and Affect: Mood normal.        Behavior: Behavior normal.        Thought Content: Thought content normal.        Judgment: Judgment normal.     Results for orders placed or performed in visit on 11/11/22  EXERCISE TOLERANCE TEST (ETT)  Result Value Ref Range   Angina Index 0    Rest HR 54.0 bpm   Rest BP 105/68 mmHg   Exercise duration (min) 9 min   Exercise duration (sec) 0 sec   Estimated workload 10.1    Peak HR 130 bpm   Peak BP 153/99  mmHg   MPHR 173 bpm   Percent HR 75.0 %   RPE 15.0    [facility] Treadmill Score 9    ST Depression (mm) 0 mm      Assessment & Plan:   Problem List Items Addressed This Visit       Other   Depression    Chronic. Not well controlled.  Will increase Effexor to '150mg'$  daily.  Will also increase Buspar to '20mg'$  TID.  Side effects and benefits of medication discussed.  Follow up in 2 months.  Call sooner if concerns arise.       Relevant Medications   venlafaxine XR (EFFEXOR XR) 150 MG 24 hr capsule   Anxiety - Primary    Chronic. Not well controlled.  Will increase Effexor to '150mg'$  daily.  Will also increase Buspar to '20mg'$  TID.  Side effects and benefits of medication discussed.  Follow up in 2 months.  Call sooner if concerns arise.       Relevant Medications   venlafaxine XR (EFFEXOR XR) 150 MG 24 hr capsule     Follow up plan: Return in about 2 months (around 02/03/2023) for Depression/Anxiety FU.

## 2022-12-05 ENCOUNTER — Encounter: Payer: Self-pay | Admitting: Nurse Practitioner

## 2022-12-05 ENCOUNTER — Ambulatory Visit (INDEPENDENT_AMBULATORY_CARE_PROVIDER_SITE_OTHER): Payer: Medicaid Other | Admitting: Nurse Practitioner

## 2022-12-05 VITALS — BP 114/72 | HR 85 | Temp 98.2°F | Wt 169.1 lb

## 2022-12-05 DIAGNOSIS — F419 Anxiety disorder, unspecified: Secondary | ICD-10-CM | POA: Diagnosis not present

## 2022-12-05 DIAGNOSIS — F3289 Other specified depressive episodes: Secondary | ICD-10-CM | POA: Diagnosis not present

## 2022-12-05 MED ORDER — VENLAFAXINE HCL ER 150 MG PO CP24: 150.0000 mg | ORAL_CAPSULE | Freq: Every day | ORAL | 1 refills | Status: DC

## 2022-12-05 NOTE — Assessment & Plan Note (Signed)
Chronic. Not well controlled.  Will increase Effexor to '150mg'$  daily.  Will also increase Buspar to '20mg'$  TID.  Side effects and benefits of medication discussed.  Follow up in 2 months.  Call sooner if concerns arise.

## 2022-12-07 ENCOUNTER — Emergency Department: Payer: Medicaid Other

## 2022-12-07 ENCOUNTER — Other Ambulatory Visit: Payer: Self-pay

## 2022-12-07 ENCOUNTER — Emergency Department
Admission: EM | Admit: 2022-12-07 | Discharge: 2022-12-07 | Disposition: A | Discharge status: Home / Self Care | Payer: Medicaid Other | Attending: Emergency Medicine | Admitting: Emergency Medicine

## 2022-12-07 DIAGNOSIS — I4892 Unspecified atrial flutter: Secondary | ICD-10-CM | POA: Insufficient documentation

## 2022-12-07 DIAGNOSIS — I499 Cardiac arrhythmia, unspecified: Secondary | ICD-10-CM | POA: Diagnosis not present

## 2022-12-07 DIAGNOSIS — Z79899 Other long term (current) drug therapy: Secondary | ICD-10-CM | POA: Diagnosis not present

## 2022-12-07 DIAGNOSIS — R42 Dizziness and giddiness: Secondary | ICD-10-CM

## 2022-12-07 DIAGNOSIS — R079 Chest pain, unspecified: Secondary | ICD-10-CM | POA: Diagnosis not present

## 2022-12-07 DIAGNOSIS — I959 Hypotension, unspecified: Secondary | ICD-10-CM | POA: Diagnosis not present

## 2022-12-07 DIAGNOSIS — I10 Essential (primary) hypertension: Secondary | ICD-10-CM | POA: Diagnosis not present

## 2022-12-07 DIAGNOSIS — Z7901 Long term (current) use of anticoagulants: Secondary | ICD-10-CM | POA: Diagnosis not present

## 2022-12-07 DIAGNOSIS — R000 Tachycardia, unspecified: Secondary | ICD-10-CM | POA: Diagnosis not present

## 2022-12-07 DIAGNOSIS — R001 Bradycardia, unspecified: Secondary | ICD-10-CM | POA: Diagnosis not present

## 2022-12-07 LAB — CBC
HCT: 48.3 % (ref 39.0–52.0)
Hemoglobin: 16.5 g/dL (ref 13.0–17.0)
MCH: 33.2 pg (ref 26.0–34.0)
MCHC: 34.2 g/dL (ref 30.0–36.0)
MCV: 97.2 fL (ref 80.0–100.0)
Platelets: 236 10*3/uL (ref 150–400)
RBC: 4.97 MIL/uL (ref 4.22–5.81)
RDW: 12.7 % (ref 11.5–15.5)
WBC: 8.5 10*3/uL (ref 4.0–10.5)
nRBC: 0 % (ref 0.0–0.2)

## 2022-12-07 LAB — BASIC METABOLIC PANEL
Anion gap: 5 (ref 5–15)
BUN: 15 mg/dL (ref 6–20)
CO2: 23 mmol/L (ref 22–32)
Calcium: 8.4 mg/dL — ABNORMAL LOW (ref 8.9–10.3)
Chloride: 109 mmol/L (ref 98–111)
Creatinine, Ser: 0.84 mg/dL (ref 0.61–1.24)
GFR, Estimated: >60 mL/min (ref >60)
Glucose, Bld: 119 mg/dL — ABNORMAL HIGH (ref 70–99)
Potassium: 3.7 mmol/L (ref 3.5–5.1)
Sodium: 137 mmol/L (ref 135–145)

## 2022-12-07 LAB — TROPONIN I (HIGH SENSITIVITY): Troponin I (High Sensitivity): 4 ng/L (ref <18)

## 2022-12-07 LAB — MAGNESIUM: Magnesium: 2.2 mg/dL (ref 1.7–2.4)

## 2022-12-07 MED ORDER — BUSPIRONE HCL 5 MG PO TABS
10.0000 mg | ORAL_TABLET | Freq: Three times a day (TID) | ORAL | Status: DC
  Administered 2022-12-07: 10 mg via ORAL
  Filled 2022-12-07: qty 2

## 2022-12-07 MED ORDER — FLECAINIDE ACETATE 50 MG PO TABS
50.0000 mg | ORAL_TABLET | Freq: Two times a day (BID) | ORAL | Status: DC
  Administered 2022-12-07: 50 mg via ORAL
  Filled 2022-12-07: qty 1

## 2022-12-07 MED ORDER — METOPROLOL TARTRATE 50 MG PO TABS
50.0000 mg | ORAL_TABLET | Freq: Two times a day (BID) | ORAL | Status: DC
  Administered 2022-12-07: 50 mg via ORAL
  Filled 2022-12-07: qty 1

## 2022-12-07 MED ORDER — VENLAFAXINE HCL ER 150 MG PO CP24: 150.0000 mg | ORAL_CAPSULE | Freq: Every day | ORAL | Status: DC

## 2022-12-07 MED ORDER — APIXABAN 5 MG PO TABS
5.0000 mg | ORAL_TABLET | Freq: Two times a day (BID) | ORAL | Status: DC
  Administered 2022-12-07: 5 mg via ORAL
  Filled 2022-12-07: qty 1

## 2022-12-07 MED ORDER — SODIUM CHLORIDE 0.9 % IV BOLUS
1000.0000 mL | Freq: Once | INTRAVENOUS | Status: AC
  Administered 2022-12-07: 1000 mL via INTRAVENOUS

## 2022-12-07 NOTE — ED Notes (Addendum)
Pt given water and crackers at this time. Wife at bedside

## 2022-12-07 NOTE — ED Notes (Signed)
Green, purple, and blue top sent to lab at this time.

## 2022-12-07 NOTE — ED Notes (Signed)
RN to room after monitor showing HR 130s. Pt with no new complaints. EKG obtained. Then pt HR dropped to 90s and showing aflutter. Additional EKG obtained and given to MD.

## 2022-12-07 NOTE — ED Notes (Addendum)
Lab called stating Bmp hemolyzed. Lab sending tech for recollect.

## 2022-12-07 NOTE — ED Provider Notes (Signed)
[facility] Provider Note    Event Date/Time   First MD Initiated Contact with Patient 12/07/22 1052     (approximate)   History   Chief Complaint: Dizziness and Irregular Heart Beat   HPI  Patrick Short is a 47 y.o. male with a history of anxiety, depression, hypertension, paroxysmal atrial fibs/flutter who comes to the ED complaining of lightheadedness this morning.  He is on Eliquis metoprolol and flecainide and sees cardiology Dr. Saunders Revel.  Denies chest pain or shortness of breath.  No new medications or over-the-counter meds.  No alcohol or caffeine or other stimulant use.  No recent illness.  Felt normal last night at bedtime.     Physical Exam   Triage Vital Signs: ED Triage Vitals  Enc Vitals Group     BP 12/07/22 1049 (!) 140/78     Pulse Rate 12/07/22 1047 64     Resp 12/07/22 1047 18     Temp 12/07/22 1047 98.2 F (36.8 C)     Temp Source 12/07/22 1047 Oral     SpO2 --      Weight --      Height --      Head Circumference --      Peak Flow --      Pain Score 12/07/22 1047 0     Pain Loc --      Pain Edu? --      Excl. in [place]? --     Most recent vital signs: Vitals:   12/07/22 1100 12/07/22 1245  BP: 137/83   Pulse: (!) 57 62  Resp: 20 16  Temp:    SpO2: 95% 95%    General: Awake, no distress.  CV:  Good peripheral perfusion.  Regular rhythm heart rate of 65 Resp:  Normal effort.  Clear to auscultation bilaterally Abd:  No distention.  Soft nontender Other:  Moist oral mucosa.  No lower extremity edema.  Thyroid nonpalpable   ED Results / Procedures / Treatments   Labs (all labs ordered are listed, but only abnormal results are displayed) Labs Reviewed  BASIC METABOLIC PANEL - Abnormal; Notable for the following components:      Result Value   Glucose, Bld 119 (*)    Calcium 8.4 (*)    All other components within normal limits  CBC  MAGNESIUM  TROPONIN I (HIGH SENSITIVITY)     EKG Interpreted by me Sinus rhythm,  rate of 57, normal axis, normal intervals.  Normal QRS ST segments and T waves.  Frequent PJCs  Repeat EKG interpreted by me, shows atrial flutter with 2-1 conduction, rate of 135.  No ischemic changes.  30 EKG interpreted by me, shows atrial fibrillation, rate of 95.  Normal axis and intervals.  Normal QRS and ST segments.  No ischemic changes   RADIOLOGY Chest x-ray interpreted by me, appears normal.  Radiology report reviewed   PROCEDURES:  Procedures   MEDICATIONS ORDERED IN ED: Medications  apixaban (ELIQUIS) tablet 5 mg (5 mg Oral Given 12/07/22 1130)  busPIRone (BUSPAR) tablet 10 mg (10 mg Oral Given 12/07/22 1130)  flecainide (TAMBOCOR) tablet 50 mg (50 mg Oral Given 12/07/22 1157)  metoprolol tartrate (LOPRESSOR) tablet 50 mg (50 mg Oral Given 12/07/22 1130)  venlafaxine XR (EFFEXOR-XR) 24 hr capsule 150 mg (has no administration in time range)  sodium chloride 0.9 % bolus 1,000 mL (0 mLs Intravenous Stopped 12/07/22 1343)     IMPRESSION / MDM / [place] /  ED COURSE  I reviewed the triage vital signs and the nursing notes.  DDx: Paroxysmal atrial fibrillation/flutter, dehydration, AKI, electrolyte abnormality.  Doubt ACS PE dissection pericarditis  Patient's presentation is most consistent with acute presentation with potential threat to life or bodily function.  Patient presents with lightheadedness in the setting of intermittent arrhythmia.  No focal symptoms.  No specific cardiac symptoms.  Will give IV fluids for hydration while checking labs.  Patient has not had his morning metoprolol and flecainide yet which we will order.   ----------------------------------------- 2:18 PM on 12/07/2022 ----------------------------------------- Patient received his medications.  Vital signs remained stable, persistent sinus rhythm with frequent premature beats.  He reports this is typical for him.  Labs are all normal.  Symptoms are improved.  Stable to call cardiology  tomorrow.      FINAL CLINICAL IMPRESSION(S) / ED DIAGNOSES   Final diagnoses:  Dizziness  Atrial flutter, paroxysmal ([place])     Rx / DC Orders   ED Discharge Orders     None        Note:  This document was prepared using Dragon voice recognition software and may include unintentional dictation errors.   Carrie Mew, MD 12/07/22 [phone]

## 2022-12-07 NOTE — ED Triage Notes (Signed)
Pt to ED via [facility] from home. Pt woke up not feelign great. EMS reports on arrival EKG was showing different rhythms. SB x1 HR 50 aflutter x1 and runs of afib. Pt denies CP or SOB. Pt does endorse feeling dizzy. EMS gave 300cc NS in route. Pt has hx afib and aflutter. Pt is on Elqiuis and metoprolol.

## 2022-12-09 ENCOUNTER — Ambulatory Visit: Payer: Medicaid Other | Admitting: Nurse Practitioner

## 2022-12-16 ENCOUNTER — Encounter: Payer: Self-pay | Admitting: Cardiology

## 2022-12-16 NOTE — Progress Notes (Signed)
Samples of Xarelto 20 mg tablets were left at our front desk in 06/2022.  After further review of his chart, the patient ended up stopping Xarelto (ok'ed per provider) and did not pick up his samples.  Returning to sample inventory: Xarelto 20 mg Lot: [id number] EXp: 12/2023 # 4 bottles

## 2022-12-17 ENCOUNTER — Ambulatory Visit: Payer: Medicaid Other | Attending: Internal Medicine | Admitting: Internal Medicine

## 2022-12-17 ENCOUNTER — Encounter: Payer: Self-pay | Admitting: Internal Medicine

## 2022-12-17 VITALS — BP 104/70 | HR 54 | Ht 66.0 in | Wt 165.0 lb

## 2022-12-17 DIAGNOSIS — I429 Cardiomyopathy, unspecified: Secondary | ICD-10-CM | POA: Diagnosis not present

## 2022-12-17 DIAGNOSIS — I480 Paroxysmal atrial fibrillation: Secondary | ICD-10-CM

## 2022-12-17 MED ORDER — DILTIAZEM HCL ER COATED BEADS 240 MG PO CP24: 240.0000 mg | ORAL_CAPSULE | Freq: Every day | ORAL | 1 refills | Status: DC

## 2022-12-17 NOTE — Patient Instructions (Addendum)
Medication Instructions:  STOP the Metoprolol Tartrate  START Diltiazem 240 mg once daily  *If you need a refill on your cardiac medications before your next appointment, please call your pharmacy*   Lab Work: None orered If you have labs (blood work) drawn today and your tests are completely normal, you will receive your results only by: [place] (if you have MyChart) OR A paper copy in the mail If you have any lab test that is abnormal or we need to change your treatment, we will call you to review the results.   Testing/Procedures: None ordered   Follow-Up: At [facility], you and your health needs are our priority.  As part of our continuing mission to provide you with exceptional heart care, we have created designated Provider Care Teams.  These Care Teams include your primary Cardiologist (physician) and Advanced Practice Providers (APPs -  Physician Assistants and Nurse Practitioners) who all work together to provide you with the care you need, when you need it.  We recommend signing up for the patient portal called "MyChart".  Sign up information is provided on this After Visit Summary.  MyChart is used to connect with patients for Virtual Visits (Telemedicine).  Patients are able to view lab/test results, encounter notes, upcoming appointments, etc.  Non-urgent messages can be sent to your provider as well.   To learn more about what you can do with MyChart, go to [email or web address].    Your next appointment:   6 month(s)  Provider:   You may see Nelva Bush, MD or one of the following Advanced Practice Providers on your designated Care Team:   Murray Hodgkins, NP Christell Faith, PA-C Cadence Kathlen Mody, PA-C Gerrie Nordmann, NP    Soonest available appointment with Dr. Curt Bears (provider switch)

## 2022-12-17 NOTE — Progress Notes (Signed)
Follow-up Outpatient Visit Date: 12/17/2022  Primary Care Provider: Jon Billings, NP [place] [address]  Chief Complaint: Follow-up recurrent atrial fibrillation  HPI:  Patrick Short is a 47 y.o. male with history of paroxysmal atrial fibrillation status post ablation, cardiomyopathy (presumed nonischemic with LVEF of 40-45% by TEE at time of atrial fibrillation ablation in 10/2021 and normalization on repeat echo in 05/2022), hypertension, anxiety, depression, and PTSD, who presents for follow-up after ED visit about 2 weeks ago for lightheadedness.  EKGs showed atrial flutter (initially with 2:1 AV block and subsequently variable block).  He was given his home regimen of flecainide and metoprolol and advised to call us for further recommendations.  Patrick Short reports that he "crashed" on the day that he went to the ED.  He felt dizzy and extremely tired.  He has continued to feel somewhat tired since then.  He stopped taking his flecainide about a week ago because it was giving him "hot and cold flashes."  He still has some palpitations, including an episode last night that lasted for about 4 hours.  He feels short of breath all the time but has not had any chest pain.  He remains compliant with apixaban and has not had any bleeding.  He continues to have some tenderness in his right groin at the site of prior atrial fibrillation ablation.  Patrick Short notes that he has not had any further allergic reactions since our last visit when he reported a severe reaction to having used his significant others soap.  He notes that he needed 5 EpiPen's to get his symptoms under control.  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  --------------------------------------------------------------------------------------------------  Past Medical History:  Diagnosis Date   Anxiety    Depression    Dysrhythmia    Hypertension    Mineral deficiency    Paroxysmal atrial  fibrillation (HCC)    PTSD (post-traumatic stress disorder)    Past Surgical History:  Procedure Laterality Date   APPENDECTOMY     ATRIAL FIBRILLATION ABLATION N/A 10/21/2021   Procedure: ATRIAL FIBRILLATION ABLATION;  Surgeon: Vickie Epley, MD;  Location: [place];  Service: Cardiovascular;  Laterality: N/A;   EYE SURGERY     laceration to pupil   FRACTURE SURGERY Left    Rod- which was replaced due to rejection of original rod   SPINAL FUSION     SPLENECTOMY, TOTAL     TEE WITHOUT CARDIOVERSION N/A 10/21/2021   Procedure: TRANSESOPHAGEAL ECHOCARDIOGRAM (TEE);  Surgeon: Vickie Epley, MD;  Location: [place];  Service: Cardiovascular;  Laterality: N/A;    Current Meds  Medication Sig   apixaban (ELIQUIS) 5 MG TABS tablet Take 1 tablet by mouth 2 (two) times daily.   busPIRone (BUSPAR) 10 MG tablet Take 1 tablet (10 mg total) by mouth 3 (three) times daily.   EPINEPHrine 0.3 mg/0.3 mL IJ SOAJ injection Inject 0.3 mg into the muscle as needed for anaphylaxis.   metoprolol tartrate (LOPRESSOR) 50 MG tablet Take 1 tablet (50 mg total) by mouth 2 (two) times daily.   rOPINIRole (REQUIP) 1 MG tablet Take 1 tablet (1 mg total) by mouth at bedtime.   traZODone (DESYREL) 50 MG tablet Take 1-2 tablets (50-100 mg total) by mouth at bedtime as needed. for sleep   venlafaxine XR (EFFEXOR XR) 150 MG 24 hr capsule Take 1 capsule (150 mg total) by mouth daily with breakfast.  VENTOLIN HFA 108 (90 Base) MCG/ACT inhaler TAKE 2 PUFFS BY MOUTH EVERY 6 HOURS AS NEEDED FOR WHEEZE OR SHORTNESS OF BREATH    Allergies: Aspirin, Bee venom, Penicillins, and Sulfa antibiotics  Social History   Tobacco Use   Smoking status: Former    Packs/day: 1.00    Years: 25.00    Total pack years: 25.00    Types: Cigarettes   Smokeless tobacco: Never   Tobacco comments:    Former smoker 11/18/21  Vaping Use   Vaping Use: Never used  Substance Use Topics   Alcohol use: Not Currently    Drug use: Yes    Frequency: 7.0 times per week    Types: Marijuana    Comment: daily 10/30/22    Family History  Problem Relation Age of Onset   Hypertension Mother    Atrial fibrillation Mother    Hypertension Father    Heart disease Maternal Grandmother    Hypertension Maternal Grandmother    Hypertension Paternal Grandfather    Coronary artery disease Paternal Grandfather    Other Son        Trisomy 21    Review of Systems: A 12-system review of systems was performed and was negative except as noted in the HPI.  --------------------------------------------------------------------------------------------------  Physical Exam: BP 104/70 (BP Location: Left Arm, Patient Position: Sitting, Cuff Size: Normal)   Pulse (!) 54   Ht '5\' 6"'$  (1.676 m)   Wt 165 lb (74.8 kg)   SpO2 99%   BMI 26.63 kg/m   General:  NAD. Neck: No JVD or HJR. Lungs: Clear to auscultation bilaterally without wheezes or crackles. Heart: Bradycardic but regular without murmurs, rubs, or gallops. Abdomen: Soft, nontender, nondistended. Extremities: No lower extremity edema.  Right femoral venotomy site is well-healed.  No hematoma or bruit appreciated.  Right femoral pulse 2+.  EKG: Sinus bradycardia (heart rate 54 bpm).  Otherwise, no significant abnormality.  Lab Results  Component Value Date   WBC 8.5 12/07/2022   HGB 16.5 12/07/2022   HCT 48.3 12/07/2022   MCV 97.2 12/07/2022   PLT 236 12/07/2022    Lab Results  Component Value Date   NA 137 12/07/2022   K 3.7 12/07/2022   CL 109 12/07/2022   CO2 23 12/07/2022   BUN 15 12/07/2022   CREATININE 0.84 12/07/2022   GLUCOSE 119 (H) 12/07/2022   ALT 27 11/04/2022    Lab Results  Component Value Date   CHOL 170 09/08/2022   HDL 44 09/08/2022   LDLCALC 100 (H) 09/08/2022   TRIG 146 09/08/2022   CHOLHDL 3.9 09/08/2022    --------------------------------------------------------------------------------------------------  ASSESSMENT  AND PLAN: Paroxysmal atrial fibrillation and atrial flutter: Patrick Short has converted back to sinus rhythm on his own.  In the ED, tracings are most consistent with atrial flutter.  He is status post atrial fibrillation ablation just over a year ago but unfortunately has had recurrence of his atrial fibrillation as well as some atrial flutter.  He did not tolerate flecainide due to hot and cold flashes and has been off this medication for about a week now.  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  We have therefore agreed to discontinue metoprolol and instead start diltiazem 240 mg daily.  I will refrain from adding an antiarrhythmic at this time but instead try to arrange for Patrick Short to see Dr. Curt Bears as soon  as possible (he previously followed with Dr. Quentin Ore but wishes to see Dr. Curt Bears instead).  Cardiomyopathy: Patrick Short notes quite a bit of fatigue but appears euvolemic.  It is unclear to me if the fatigue is due to his PAF or something else.  Only low EF was documented by TEE at time of atrial fibrillation ablation in 10/2021.  Subsequent echo in 05/2022 showed normal LVEF.  As above, we will hold metoprolol.  Follow-up: Return to clinic in 6 months.  Nelva Bush, MD 12/17/2022 10:48 AM

## 2023-01-26 ENCOUNTER — Encounter: Payer: Self-pay | Admitting: Nurse Practitioner

## 2023-01-26 ENCOUNTER — Other Ambulatory Visit (HOSPITAL_COMMUNITY): Payer: Self-pay | Admitting: Physician Assistant

## 2023-01-27 ENCOUNTER — Ambulatory Visit: Payer: Self-pay | Admitting: *Deleted

## 2023-01-27 NOTE — Progress Notes (Unsigned)
   There were no vitals taken for this visit.   Subjective:    Patient ID: DENZEL ETIENNE, male    DOB: [birth year], 47 y.o.   MRN: [id number]  HPI: SERAJ DUNNAM is a 47 y.o. male  No chief complaint on file.   Relevant past medical, surgical, family and social history reviewed and updated as indicated. Interim medical history since our last visit reviewed. Allergies and medications reviewed and updated.  Review of Systems  Per HPI unless specifically indicated above     Objective:    There were no vitals taken for this visit.  Wt Readings from Last 3 Encounters:  12/17/22 165 lb (74.8 kg)  12/05/22 169 lb 1.6 oz (76.7 kg)  11/13/22 174 lb 8 oz (79.2 kg)    Physical Exam  Results for orders placed or performed during the hospital encounter of 12/07/22  CBC  Result Value Ref Range   WBC 8.5 4.0 - 10.5 K/uL   RBC 4.97 4.22 - 5.81 MIL/uL   Hemoglobin 16.5 13.0 - 17.0 g/dL   HCT 65.7 84.6 - 96.2 %   MCV 97.2 80.0 - 100.0 fL   MCH 33.2 26.0 - 34.0 pg   MCHC 34.2 30.0 - 36.0 g/dL   RDW 95.2 84.1 - 32.4 %   Platelets 236 150 - 400 K/uL   nRBC 0.0 0.0 - 0.2 %  Basic metabolic panel  Result Value Ref Range   Sodium 137 135 - 145 mmol/L   Potassium 3.7 3.5 - 5.1 mmol/L   Chloride 109 98 - 111 mmol/L   CO2 23 22 - 32 mmol/L   Glucose, Bld 119 (H) 70 - 99 mg/dL   BUN 15 6 - 20 mg/dL   Creatinine, Ser 4.01 0.61 - 1.24 mg/dL   Calcium 8.4 (L) 8.9 - 10.3 mg/dL   GFR, Estimated >02 >72 mL/min   Anion gap 5 5 - 15  Magnesium  Result Value Ref Range   Magnesium 2.2 1.7 - 2.4 mg/dL  Troponin I (High Sensitivity)  Result Value Ref Range   Troponin I (High Sensitivity) 4 <18 ng/L      Assessment & Plan:   Problem List Items Addressed This Visit   None    Follow up plan: No follow-ups on file.

## 2023-01-27 NOTE — Telephone Encounter (Signed)
Summary: Congestion   Patient states that he has had congestion for the last few days and would like a refill on prednisone. Patient refused to schedule appointment.        January 27, 2023 [name]  to P [facility] (supporting Larae Grooms, NP)      01/27/23  9:19 AM I appreciate you all.  Pablo Ledger, CMA  to KIRAN CARLINE      01/27/23  9:18 AM Ok, I have you scheduled at 8:00 in the morning to see Clydie Braun  Last read by [name] at  9:19 AM on 01/27/2023.  Patient has scheduled an appointment- MyChart conversation- attempted to call patient- left message to call office if he needs anything before that appointment.

## 2023-01-28 ENCOUNTER — Ambulatory Visit
Admission: RE | Admit: 2023-01-28 | Discharge: 2023-01-28 | Disposition: A | Discharge status: Home / Self Care | Payer: Medicaid Other | Source: Ambulatory Visit | Attending: Nurse Practitioner | Admitting: Nurse Practitioner

## 2023-01-28 ENCOUNTER — Ambulatory Visit (INDEPENDENT_AMBULATORY_CARE_PROVIDER_SITE_OTHER): Payer: Medicaid Other | Admitting: Nurse Practitioner

## 2023-01-28 ENCOUNTER — Ambulatory Visit
Admission: RE | Admit: 2023-01-28 | Discharge: 2023-01-28 | Disposition: A | Discharge status: Home / Self Care | Payer: Medicaid Other | Source: Home / Self Care | Attending: Nurse Practitioner | Admitting: Nurse Practitioner

## 2023-01-28 VITALS — BP 145/94 | HR 75 | Temp 98.0°F | Wt 161.6 lb

## 2023-01-28 DIAGNOSIS — R062 Wheezing: Secondary | ICD-10-CM

## 2023-01-28 DIAGNOSIS — J0110 Acute frontal sinusitis, unspecified: Secondary | ICD-10-CM | POA: Diagnosis not present

## 2023-01-28 MED ORDER — EPINEPHRINE 0.3 MG/0.3ML IJ SOAJ: 0.3000 mg | INTRAMUSCULAR | 1 refills | Status: DC | PRN

## 2023-01-28 MED ORDER — ALBUTEROL SULFATE HFA 108 (90 BASE) MCG/ACT IN AERS: INHALATION_SPRAY | RESPIRATORY_TRACT | 1 refills | Status: DC

## 2023-01-28 MED ORDER — DOXYCYCLINE HYCLATE 100 MG PO TABS: 100.0000 mg | ORAL_TABLET | Freq: Two times a day (BID) | ORAL | 0 refills | Status: DC

## 2023-01-28 MED ORDER — METHYLPREDNISOLONE 4 MG PO TBPK: ORAL_TABLET | ORAL | 0 refills | Status: DC

## 2023-01-30 ENCOUNTER — Encounter (HOSPITAL_COMMUNITY): Payer: Self-pay

## 2023-01-30 ENCOUNTER — Emergency Department (HOSPITAL_COMMUNITY)
Admission: EM | Admit: 2023-01-30 | Discharge: 2023-01-30 | Disposition: A | Discharge status: Home / Self Care | Payer: Medicaid Other | Attending: Emergency Medicine | Admitting: Emergency Medicine

## 2023-01-30 ENCOUNTER — Emergency Department (HOSPITAL_COMMUNITY): Payer: Medicaid Other

## 2023-01-30 ENCOUNTER — Other Ambulatory Visit: Payer: Self-pay

## 2023-01-30 DIAGNOSIS — I10 Essential (primary) hypertension: Secondary | ICD-10-CM | POA: Diagnosis not present

## 2023-01-30 DIAGNOSIS — R0602 Shortness of breath: Secondary | ICD-10-CM | POA: Diagnosis not present

## 2023-01-30 DIAGNOSIS — Z7901 Long term (current) use of anticoagulants: Secondary | ICD-10-CM | POA: Insufficient documentation

## 2023-01-30 DIAGNOSIS — Z79899 Other long term (current) drug therapy: Secondary | ICD-10-CM | POA: Insufficient documentation

## 2023-01-30 LAB — BASIC METABOLIC PANEL
Anion gap: 8 (ref 5–15)
BUN: 11 mg/dL (ref 6–20)
CO2: 23 mmol/L (ref 22–32)
Calcium: 9.4 mg/dL (ref 8.9–10.3)
Chloride: 106 mmol/L (ref 98–111)
Creatinine, Ser: 0.99 mg/dL (ref 0.61–1.24)
GFR, Estimated: >60 mL/min (ref >60)
Glucose, Bld: 119 mg/dL — ABNORMAL HIGH (ref 70–99)
Potassium: 4.2 mmol/L (ref 3.5–5.1)
Sodium: 137 mmol/L (ref 135–145)

## 2023-01-30 LAB — CBC WITH DIFFERENTIAL/PLATELET
Abs Immature Granulocytes: 0.05 10*3/uL (ref 0.00–0.07)
Basophils Absolute: 0 10*3/uL (ref 0.0–0.1)
Basophils Relative: 0 %
Eosinophils Absolute: 0 10*3/uL (ref 0.0–0.5)
Eosinophils Relative: 0 %
HCT: 45.1 % (ref 39.0–52.0)
Hemoglobin: 15.7 g/dL (ref 13.0–17.0)
Immature Granulocytes: 1 %
Lymphocytes Relative: 6 %
Lymphs Abs: 0.6 10*3/uL — ABNORMAL LOW (ref 0.7–4.0)
MCH: 33.7 pg (ref 26.0–34.0)
MCHC: 34.8 g/dL (ref 30.0–36.0)
MCV: 96.8 fL (ref 80.0–100.0)
Monocytes Absolute: 0.2 10*3/uL (ref 0.1–1.0)
Monocytes Relative: 2 %
Neutro Abs: 9.2 10*3/uL — ABNORMAL HIGH (ref 1.7–7.7)
Neutrophils Relative %: 91 %
Platelets: 322 10*3/uL (ref 150–400)
RBC: 4.66 MIL/uL (ref 4.22–5.81)
RDW: 12.3 % (ref 11.5–15.5)
WBC: 10.1 10*3/uL (ref 4.0–10.5)
nRBC: 0 % (ref 0.0–0.2)

## 2023-01-30 LAB — TROPONIN I (HIGH SENSITIVITY): Troponin I (High Sensitivity): 10 ng/L (ref <18)

## 2023-01-30 LAB — D-DIMER, QUANTITATIVE: D-Dimer, Quant: 0.32 ug/mL-FEU (ref 0.00–0.50)

## 2023-01-30 MED ORDER — SODIUM CHLORIDE 0.9 % IV BOLUS
1000.0000 mL | Freq: Once | INTRAVENOUS | Status: AC
  Administered 2023-01-30: 1000 mL via INTRAVENOUS

## 2023-01-30 MED ORDER — IOHEXOL 350 MG/ML SOLN
50.0000 mL | Freq: Once | INTRAVENOUS | Status: AC | PRN
  Administered 2023-01-30: 50 mL via INTRAVENOUS

## 2023-01-30 NOTE — ED Provider Triage Note (Signed)
Emergency Medicine Provider Triage Evaluation Note  LOURDES MANNING , a 47 y.o. male  was evaluated in triage.  Pt complains of chest tightness and shortness of breath that began earlier in the week.  Patient was evaluated by family medicine and had chest x-ray done 01/28/23, which has not yet been read.  Patient was contacted by his doctor who told him to come to ED due to CXR showing "fluid filled".  He states his SPO2 at home was in the 60s before a breathing treatment and in the 80s afterward.  He is 99% on RA here.   Review of Systems  Positive: As above Negative: As above  Physical Exam  BP (!) 152/94 (BP Location: Right Arm)   Pulse 65   Temp 98.2 F (36.8 C)   Resp 18   SpO2 98%  Gen:   Awake, no distress   Resp:  Normal effort, lungs clear to auscultation bilaterally MSK:   Moves extremities without difficulty  Other:    Medical Decision Making  Medically screening exam initiated at 1:32 PM.  Appropriate orders placed.  Rodolphe Edmonston Pienta was informed that the remainder of the evaluation will be completed by another provider, this initial triage assessment does not replace that evaluation, and the importance of remaining in the ED until their evaluation is complete.     Lenard Simmer, PA-C 01/30/23 1339

## 2023-01-30 NOTE — ED Notes (Incomplete)
Pt walked started at 97

## 2023-01-30 NOTE — Telephone Encounter (Signed)
Called and spoke with patient on the phone.  States his Sat's are in the 51s.  I advised him to be seen in the ER ASAP.

## 2023-01-30 NOTE — ED Triage Notes (Signed)
Pt came in via POV d/t PCP sending him in here d/t outpt CXR showing "fluid filled" pt does have SOB, worse with exertion. Denies Hx of asthma or COPD. Does have some chest tightness, denies other pain, A/Ox4.

## 2023-01-30 NOTE — Discharge Instructions (Signed)
You were seen in the emergency department today for shortness of breath.  As we discussed, your workup today was overall very reassuring.  We did not find an emergent etiology for your symptoms.  We evaluated you for risk of injury to your heart, blood clot in your lungs, or underlying pneumonia, and this was all negative.  You could continue to feel weak and dizzy after possibly having a viral infection.  I would continue the medications that your doctor already gave you.  I attached the contact information for the pulmonologist (lung doctor) for you to follow-up with.  While your oxygen saturations did dip while you are walking, I think that this is likely in the setting of the illness that you have going on.  However I would like you to follow-up with them for further evaluation.  Continue to monitor how you're doing and return to the ER for new or worsening symptoms.

## 2023-01-30 NOTE — ED Provider Notes (Signed)
EMERGENCY DEPARTMENT AT [facility] Provider Note   CSN: [id number] Arrival date & time: 01/30/23  1200     History  Chief Complaint  Patient presents with   Shortness of Breath    Patrick Short is a 47 y.o. male with history of anxiety, PTSD, depression, HTN, paroxysmal atrial fibrillation who presents to the ER complaining of chest tightness and shortness of breath starting a few weeks ago. Worse with exertion. Went to his PCP on 4/17 and had CXR performed. Was called today by his doctor telling him to go to the ED as CXR appeared "fluid-filled". He reports checking his SpO2 at home and reports it was 60%. He performed a breathing treatment and it improved to the 80s. No hx of asthma or COPD. No leg pain or swelling.  He says he feels somewhat improved with the medicines (abx, steroids, inhaler) given to him by the PCP but overall still does not feel well. Had originally started with OTC meds a few weeks ago without relief.    Shortness of Breath      Home Medications Prior to Admission medications   Medication Sig Start Date End Date Taking? Authorizing Provider  albuterol (VENTOLIN HFA) 108 (90 Base) MCG/ACT inhaler TAKE 2 PUFFS BY MOUTH EVERY 6 HOURS AS NEEDED FOR WHEEZE OR SHORTNESS OF BREATH 01/28/23   Larae Grooms, NP  apixaban (ELIQUIS) 5 MG TABS tablet Take 1 tablet by mouth 2 (two) times daily.    [provider]  busPIRone (BUSPAR) 10 MG tablet Take 1 tablet (10 mg total) by mouth 3 (three) times daily. 11/04/22   Larae Grooms, NP  diltiazem (CARDIZEM CD) 240 MG 24 hr capsule Take 1 capsule (240 mg total) by mouth daily. 12/17/22   End, Cristal Deer, MD  doxycycline (VIBRA-TABS) 100 MG tablet Take 1 tablet (100 mg total) by mouth 2 (two) times daily. 01/28/23   Larae Grooms, NP  EPINEPHrine 0.3 mg/0.3 mL IJ SOAJ injection Inject 0.3 mg into the muscle as needed for anaphylaxis. 01/28/23   Larae Grooms, NP  methylPREDNISolone  (MEDROL DOSEPAK) 4 MG TBPK tablet Take as directed 01/28/23   Larae Grooms, NP  rOPINIRole (REQUIP) 1 MG tablet Take 1 tablet (1 mg total) by mouth at bedtime. 09/08/22   Larae Grooms, NP  traZODone (DESYREL) 50 MG tablet Take 1-2 tablets (50-100 mg total) by mouth at bedtime as needed. for sleep 09/08/22   Larae Grooms, NP  venlafaxine XR (EFFEXOR XR) 150 MG 24 hr capsule Take 1 capsule (150 mg total) by mouth daily with breakfast. 12/05/22   Larae Grooms, NP      Allergies    Aspirin, Bee venom, Penicillins, and Sulfa antibiotics    Review of Systems   Review of Systems  Constitutional:  Positive for fatigue.  HENT:  Positive for congestion.   Respiratory:  Positive for chest tightness and shortness of breath.   All other systems reviewed and are negative.   Physical Exam Updated Vital Signs BP (!) 155/97 (BP Location: Left Arm)   Pulse 63   Temp 98.1 F (36.7 C) (Oral)   Resp 17   SpO2 97%  Physical Exam Vitals and nursing note reviewed.  Constitutional:      Appearance: Normal appearance.  HENT:     Head: Normocephalic and atraumatic.  Eyes:     Conjunctiva/sclera: Conjunctivae normal.  Cardiovascular:     Rate and Rhythm: Normal rate and regular rhythm.  Pulmonary:  Effort: Pulmonary effort is normal. Tachypnea present. No respiratory distress.     Breath sounds: Normal breath sounds.  Abdominal:     General: There is no distension.     Palpations: Abdomen is soft.     Tenderness: There is no abdominal tenderness.  Musculoskeletal:     Right lower leg: No edema.     Left lower leg: No edema.  Skin:    General: Skin is warm and dry.  Neurological:     General: No focal deficit present.     Mental Status: He is alert.     ED Results / Procedures / Treatments   Labs (all labs ordered are listed, but only abnormal results are displayed) Labs Reviewed  BASIC METABOLIC PANEL - Abnormal; Notable for the following components:      Result  Value   Glucose, Bld 119 (*)    All other components within normal limits  CBC WITH DIFFERENTIAL/PLATELET - Abnormal; Notable for the following components:   Neutro Abs 9.2 (*)    Lymphs Abs 0.6 (*)    All other components within normal limits  D-DIMER, QUANTITATIVE  TROPONIN I (HIGH SENSITIVITY)    EKG EKG Interpretation  Date/Time:  Friday January 30 2023 12:44:10 EDT Ventricular Rate:  75 PR Interval:  132 QRS Duration: 80 QT Interval:  382 QTC Calculation: 426 R Axis:   34 Text Interpretation: Sinus rhythm with Premature atrial complexes with Abberant conduction Nonspecific T wave abnormality avL When compared with ECG of 07-Dec-2022 11:11, PREVIOUS ECG IS PRESENT Confirmed by Vivien Rossetti ([id number]) on 01/30/2023 3:07:46 PM  Radiology CT Chest W Contrast  Result Date: 01/30/2023 CLINICAL DATA:  Wheezing and chest pain. History of AFib and irregular heartbeat. History of ablation. Former smoker of 30 years he quit last year. EXAM: CT CHEST WITH CONTRAST TECHNIQUE: Multidetector CT imaging of the chest was performed during intravenous contrast administration. RADIATION DOSE REDUCTION: This exam was performed according to the departmental dose-optimization program which includes automated exposure control, adjustment of the mA and/or kV according to patient size and/or use of iterative reconstruction technique. CONTRAST:  50mL OMNIPAQUE IOHEXOL 350 MG/ML SOLN COMPARISON:  Chest radiograph earlier today and CTA chest 06/18/2021 and CT heart morphology 10/10/2021 FINDINGS: Cardiovascular: Normal heart size.  No pericardial effusion. Mediastinum/Nodes: Unremarkable esophagus. No thoracic adenopathy by size. Lungs/Pleura: No focal consolidation, pleural effusion, or pneumothorax. Mild scattered scarring/atelectasis. Upper Abdomen: No acute abnormality. Musculoskeletal: No acute fracture. IMPRESSION: No acute abnormality. Electronically Signed   By: Minerva Fester M.D.   On: 01/30/2023 21:21    DG Chest 2 View  Result Date: 01/30/2023 CLINICAL DATA:  Chest tightness and shortness of breath EXAM: CHEST - 2 VIEW COMPARISON:  X-ray 01/28/2023 and older FINDINGS: No consolidation, pneumothorax or effusion. No edema. Normal cardiopericardial silhouette. Overlapping cardiac leads. Distended stomach with an air-fluid level beneath the left hemidiaphragm. Degenerative changes seen of the lower thoracic spine IMPRESSION: No acute cardiopulmonary disease. Distended stomach with an air-fluid level Electronically Signed   By: Karen Kays M.D.   On: 01/30/2023 16:35    Procedures Procedures    Medications Ordered in ED Medications  sodium chloride 0.9 % bolus 1,000 mL (0 mLs Intravenous Stopped 01/30/23 2200)  iohexol (OMNIPAQUE) 350 MG/ML injection 50 mL (50 mLs Intravenous Contrast Given 01/30/23 2109)    ED Course/ Medical Decision Making/ A&P  Medical Decision Making Amount and/or Complexity of Data Reviewed Labs: ordered. Radiology: ordered.  Risk Prescription drug management.  This patient is a 47 y.o. male  who presents to the ED for concern of shortness of breath.   Differential diagnoses prior to evaluation: The emergent differential diagnosis includes, but is not limited to, CHF, pericardial effusion/tamponade, arrhythmias, ACS, COPD, asthma, bronchitis, pneumonia, pneumothorax, PE, anemia. This is not an exhaustive differential.   Past Medical History / Co-morbidities: anxiety, PTSD, depression, HTN, paroxysmal atrial fibrillation on eliquis  Additional history: Chart reviewed. Pertinent results include: reviewed PCP note from 4/17 and patient was diagnosed with frontal sinusitis, discharged with inhaler, steroids and doxycycline. Reviewed CXR from that time with no evidence of consolidation, pneumothorax, or effusion per my interpretation.   Physical Exam: Physical exam performed. The pertinent findings include: hypertensive, but otherwise  normal vital signs. In no acute distress. Normal respiratory effort, lung sounds clear, normal spo2 on room air. Ambulated and saturations went from 97% to 92-93%. No peripheral edema.   Lab Tests/Imaging studies: I personally interpreted labs/imaging and the pertinent results include: No leukocytosis, normal hemoglobin.  BMP unremarkable.  Troponin of 10.  D-dimer 0.32.  Chest x-ray without acute abnormalities.  This patient has been symptomatic for several weeks, and there was a drop in his oxygen saturation while he was walking, obtain CT chest to rule out underlying pneumonia.  CT chest without acute abnormalities.  I agree with the radiologist interpretation.  Cardiac monitoring: EKG obtained and interpreted by my attending physician which shows: Sinus rhythm with PACs and nonspecific T abnormalities   Disposition: After consideration of the diagnostic results and the patients response to treatment, I feel that emergency department workup does not suggest an emergent condition requiring admission or immediate intervention beyond what has been performed at this time. The plan is: Charged home with reassurance.  No emergent etiology found for patient's symptoms today.  He was originally sent in from PCP with concern for fluid on his x-ray, which I did not visualize myself.  Overall workup reassuring, not septic. The patient is safe for discharge and has been instructed to return immediately for worsening symptoms, change in symptoms or any other concerns.  Final Clinical Impression(s) / ED Diagnoses Final diagnoses:  SOB (shortness of breath)    Rx / DC Orders ED Discharge Orders     None      Portions of this report may have been transcribed using voice recognition software. Every effort was made to ensure accuracy; however, inadvertent computerized transcription errors may be present.    Jeanella Flattery 01/30/23 2253    Mardene Sayer, MD 01/31/23 [phone]

## 2023-02-02 NOTE — Progress Notes (Signed)
Hi Devanta. Your chest xray was unremarkable per the radiologist.  No evidence of pneumonia.

## 2023-02-03 ENCOUNTER — Ambulatory Visit: Payer: Medicaid Other | Admitting: Nurse Practitioner

## 2023-02-03 ENCOUNTER — Encounter: Payer: Self-pay | Admitting: Nurse Practitioner

## 2023-02-03 VITALS — BP 146/89 | HR 91 | Temp 98.1°F | Wt 159.8 lb

## 2023-02-03 DIAGNOSIS — E7800 Pure hypercholesterolemia, unspecified: Secondary | ICD-10-CM

## 2023-02-03 DIAGNOSIS — F3289 Other specified depressive episodes: Secondary | ICD-10-CM

## 2023-02-03 DIAGNOSIS — R7309 Other abnormal glucose: Secondary | ICD-10-CM

## 2023-02-03 DIAGNOSIS — F419 Anxiety disorder, unspecified: Secondary | ICD-10-CM

## 2023-02-03 DIAGNOSIS — I10 Essential (primary) hypertension: Secondary | ICD-10-CM | POA: Diagnosis not present

## 2023-02-03 DIAGNOSIS — I429 Cardiomyopathy, unspecified: Secondary | ICD-10-CM

## 2023-02-03 MED ORDER — BUSPIRONE HCL 10 MG PO TABS: 20.0000 mg | ORAL_TABLET | Freq: Three times a day (TID) | ORAL | 1 refills | Status: DC

## 2023-02-03 MED ORDER — VENLAFAXINE HCL ER 75 MG PO CP24: 75.0000 mg | ORAL_CAPSULE | Freq: Every day | ORAL | 1 refills | Status: DC

## 2023-02-03 NOTE — Assessment & Plan Note (Signed)
Labs ordered at visit today.  Will make recommendations based on lab results.   

## 2023-02-03 NOTE — Assessment & Plan Note (Signed)
Chronic. Ongoing.  Will increase Buspar to 20mg TID.  Effexor increased to 225mg daily.  Follow up in 2 months.  Call sooner if concerns arise.  

## 2023-02-03 NOTE — Progress Notes (Signed)
BP (!) 146/89   Pulse 91   Temp 98.1 F (36.7 C) (Oral)   Wt 159 lb 12.8 oz (72.5 kg)   SpO2 97%   BMI 25.79 kg/m    Subjective:    Patient ID: Patrick Short, male    DOB: October 02, 1976, 47 y.o.   MRN: [id number]  HPI: Patrick Short is a 47 y.o. male  Chief Complaint  Patient presents with   Anxiety   Depression   HYPERTENSION Patient states he is doing well.  See's Cardiology and will be establishing with Pulmonology next week.  Has an appt with Dr. Okey Dupre on Thursday.   Hypertension status: stable  Satisfied with current treatment? yes Duration of hypertension: years BP monitoring frequency:  daily BP range: 140/80 BP medication side effects:  no Medication compliance: excellent compliance Previous BP meds:Metoprolol Aspirin: no Recurrent headaches: no Visual changes: no Palpitations: no Dyspnea: no Chest pain: no Lower extremity edema: no Dizzy/lightheaded: no   ANXIETY/DEPRESSION Patient states that he has been good.  He is still taking Effexor 150mg  daily.  Buspar TID.  He feels like he is very snappy still.   The Buspar is helpful and he really likes taking the medication.   Denies SI.    Flowsheet Row Office Visit from 02/03/2023 in [facility]  PHQ-9 Total Score 3         02/03/2023    9:59 AM 01/28/2023    8:15 AM 12/05/2022   10:01 AM 11/04/2022    9:22 AM  GAD 7 : Generalized Anxiety Score  Nervous, Anxious, on Edge 1 1 1 1   Control/stop worrying 0 0 0 0  Worry too much - different things 0 0 0 0  Trouble relaxing 1 1 1 1   Restless 1 1 1 1   Easily annoyed or irritable 2 1 2 1   Afraid - awful might happen 0 0 0 0  Total GAD 7 Score 5 4 5 4   Anxiety Difficulty Somewhat difficult Somewhat difficult Somewhat difficult Somewhat difficult     Relevant past medical, surgical, family and social history reviewed and updated as indicated. Interim medical history since our last visit reviewed. Allergies and medications reviewed and  updated.  Review of Systems  Eyes:  Negative for visual disturbance.  Respiratory:  Negative for shortness of breath and wheezing.   Cardiovascular:  Negative for chest pain and leg swelling.  Neurological:  Negative for light-headedness and headaches.       Restless leg  Psychiatric/Behavioral:  Positive for dysphoric mood. Negative for suicidal ideas. The patient is nervous/anxious.     Per HPI unless specifically indicated above     Objective:    BP (!) 146/89   Pulse 91   Temp 98.1 F (36.7 C) (Oral)   Wt 159 lb 12.8 oz (72.5 kg)   SpO2 97%   BMI 25.79 kg/m   Wt Readings from Last 3 Encounters:  02/03/23 159 lb 12.8 oz (72.5 kg)  01/28/23 161 lb 9.6 oz (73.3 kg)  12/17/22 165 lb (74.8 kg)    Physical Exam Vitals and nursing note reviewed.  Constitutional:      General: He is not in acute distress.    Appearance: Normal appearance. He is not ill-appearing, toxic-appearing or diaphoretic.  HENT:     Head: Normocephalic.     Right Ear: External ear normal.     Left Ear: External ear normal.     Nose: Nose normal. No congestion or  rhinorrhea.     Mouth/Throat:     Mouth: Mucous membranes are moist.  Eyes:     General:        Right eye: No discharge.        Left eye: No discharge.     Extraocular Movements: Extraocular movements intact.     Conjunctiva/sclera: Conjunctivae normal.     Pupils: Pupils are equal, round, and reactive to light.  Cardiovascular:     Rate and Rhythm: Normal rate and regular rhythm.     Heart sounds: No murmur heard. Pulmonary:     Effort: Pulmonary effort is normal. No respiratory distress.     Breath sounds: Normal breath sounds. No wheezing, rhonchi or rales.  Abdominal:     General: Abdomen is flat. Bowel sounds are normal.  Musculoskeletal:     Cervical back: Normal range of motion and neck supple.  Skin:    General: Skin is warm and dry.     Capillary Refill: Capillary refill takes less than 2 seconds.  Neurological:      General: No focal deficit present.     Mental Status: He is alert and oriented to person, place, and time.  Psychiatric:        Mood and Affect: Mood normal.        Behavior: Behavior normal.        Thought Content: Thought content normal.        Judgment: Judgment normal.     Results for orders placed or performed during the hospital encounter of 01/30/23  Basic metabolic panel  Result Value Ref Range   Sodium 137 135 - 145 mmol/L   Potassium 4.2 3.5 - 5.1 mmol/L   Chloride 106 98 - 111 mmol/L   CO2 23 22 - 32 mmol/L   Glucose, Bld 119 (H) 70 - 99 mg/dL   BUN 11 6 - 20 mg/dL   Creatinine, Ser 5.02 0.61 - 1.24 mg/dL   Calcium 9.4 8.9 - 77.4 mg/dL   GFR, Estimated >12 >87 mL/min   Anion gap 8 5 - 15  CBC with Differential  Result Value Ref Range   WBC 10.1 4.0 - 10.5 K/uL   RBC 4.66 4.22 - 5.81 MIL/uL   Hemoglobin 15.7 13.0 - 17.0 g/dL   HCT 86.7 67.2 - 09.4 %   MCV 96.8 80.0 - 100.0 fL   MCH 33.7 26.0 - 34.0 pg   MCHC 34.8 30.0 - 36.0 g/dL   RDW 70.9 62.8 - 36.6 %   Platelets 322 150 - 400 K/uL   nRBC 0.0 0.0 - 0.2 %   Neutrophils Relative % 91 %   Neutro Abs 9.2 (H) 1.7 - 7.7 K/uL   Lymphocytes Relative 6 %   Lymphs Abs 0.6 (L) 0.7 - 4.0 K/uL   Monocytes Relative 2 %   Monocytes Absolute 0.2 0.1 - 1.0 K/uL   Eosinophils Relative 0 %   Eosinophils Absolute 0.0 0.0 - 0.5 K/uL   Basophils Relative 0 %   Basophils Absolute 0.0 0.0 - 0.1 K/uL   Immature Granulocytes 1 %   Abs Immature Granulocytes 0.05 0.00 - 0.07 K/uL  D-dimer, quantitative  Result Value Ref Range   D-Dimer, Quant 0.32 0.00 - 0.50 ug/mL-FEU  Troponin I (High Sensitivity)  Result Value Ref Range   Troponin I (High Sensitivity) 10 <18 ng/L      Assessment & Plan:   Problem List Items Addressed This Visit       Cardiovascular and  Mediastinum   Essential hypertension    Chronic.  Controlled.  Continue with current medication regimen of Cardizem.  Continue to follow up with Cardiology and EP.   Labs ordered today.  Return to clinic in 6 months for reevaluation.  Call sooner if concerns arise.        Cardiomyopathy - Primary    Chronic. Followed by Cardiology.  Continue to collaborate with specialist.  Has an appt with Cardiology on Thursday.         Other   Depression    Chronic. Ongoing.  Will increase Buspar to  TID.  Effexor increased to  daily.  Follow up in 2 months.  Call sooner if concerns arise.       Relevant Medications   venlafaxine XR (EFFEXOR XR) 75 MG 24 hr capsule   busPIRone (BUSPAR) 10 MG tablet   Anxiety    Chronic. Ongoing.  Will increase Buspar to  TID.  Effexor increased to  daily.  Follow up in 2 months.  Call sooner if concerns arise.       Relevant Medications   venlafaxine XR (EFFEXOR XR) 75 MG 24 hr capsule   busPIRone (BUSPAR) 10 MG tablet   Elevated LDL cholesterol level    Labs ordered at visit today.  Will make recommendations based on lab results.        Elevated glucose    Labs ordered at visit today.  Will make recommendations based on lab results.          Follow up plan: Return in about 2 months (around 04/05/2023) for Depression/Anxiety FU.

## 2023-02-03 NOTE — Assessment & Plan Note (Signed)
Chronic. Ongoing.  Will increase Buspar to  TID.  Effexor increased to  daily.  Follow up in 2 months.  Call sooner if concerns arise.

## 2023-02-03 NOTE — Assessment & Plan Note (Signed)
Chronic. Followed by Cardiology.  Continue to collaborate with specialist.  Has an appt with Cardiology on Thursday.

## 2023-02-03 NOTE — Assessment & Plan Note (Signed)
Chronic.  Controlled.  Continue with current medication regimen of Cardizem.  Continue to follow up with Cardiology and EP.  Labs ordered today.  Return to clinic in 6 months for reevaluation.  Call sooner if concerns arise.

## 2023-02-05 ENCOUNTER — Ambulatory Visit (HOSPITAL_COMMUNITY): Payer: Medicaid Other | Admitting: Physician Assistant

## 2023-02-07 IMAGING — CT CT ANGIO CHEST
2 of 6 series · 18 of 46 positions shown · IV contrast (APPLIED)
Comparison: None.

CLINICAL DATA: Rule out pulmonary embolus. Chest pain and positive
D-dimer.

EXAM:
CT ANGIOGRAPHY CHEST WITH CONTRAST
TECHNIQUE: Multidetector CT imaging of the chest was performed using the
standard protocol during bolus administration of intravenous
contrast. Multiplanar CT image reconstructions and MIPs were
obtained to evaluate the vascular anatomy.
CONTRAST:  75mL OMNIPAQUE IOHEXOL 350 MG/ML SOLN

[Series 5: thins · axial · 0.68mm/px · z∈[-486,-228]mm · 15 of 352 slices shown]
[im 15/352  lung]
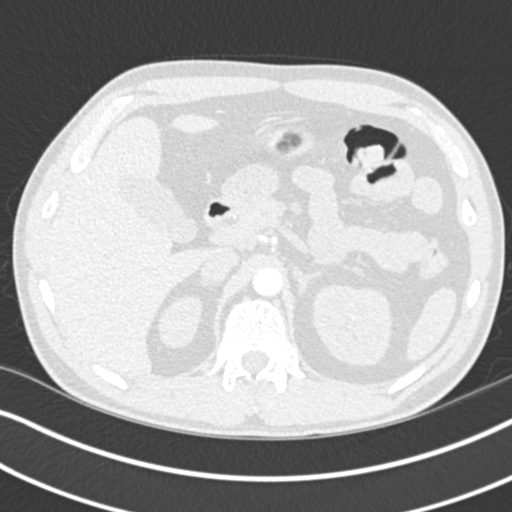
[im 44/352  soft-tissue]
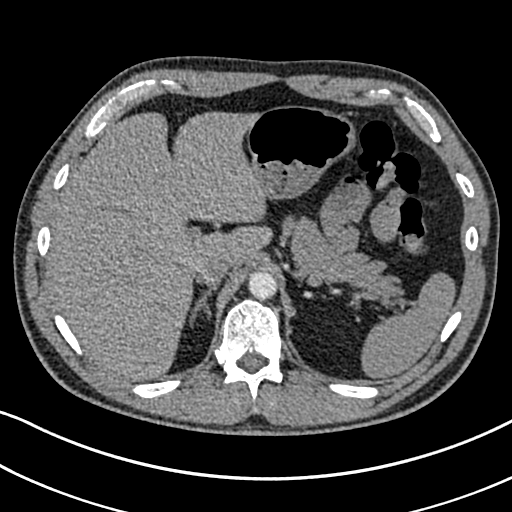
[im 59/352  lung]
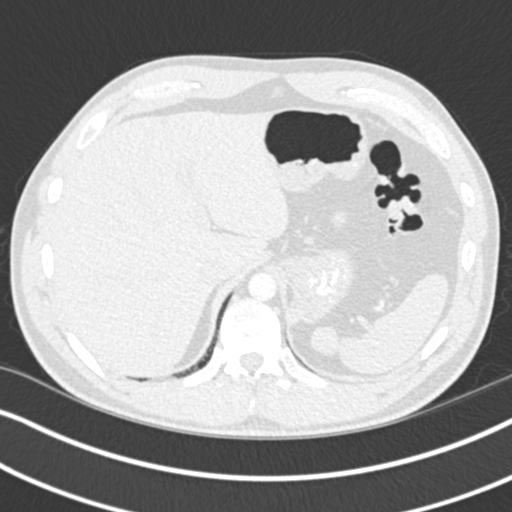
[im 88/352  soft-tissue]
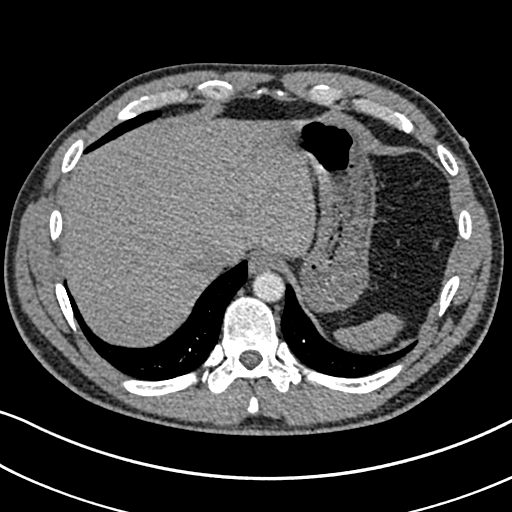
[im 103/352  lung]
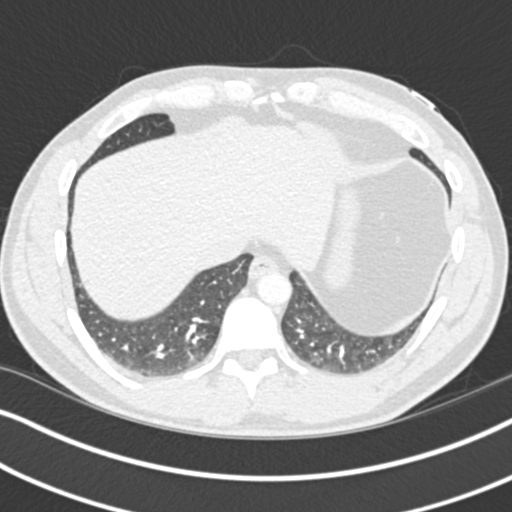
[im 132/352  soft-tissue]
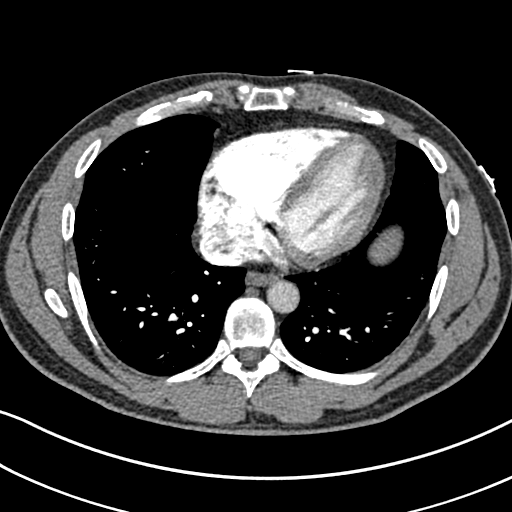
[im 147/352  lung]
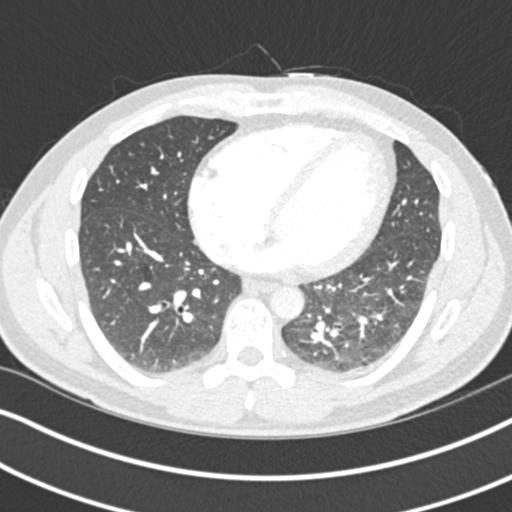
[im 176/352  soft-tissue]
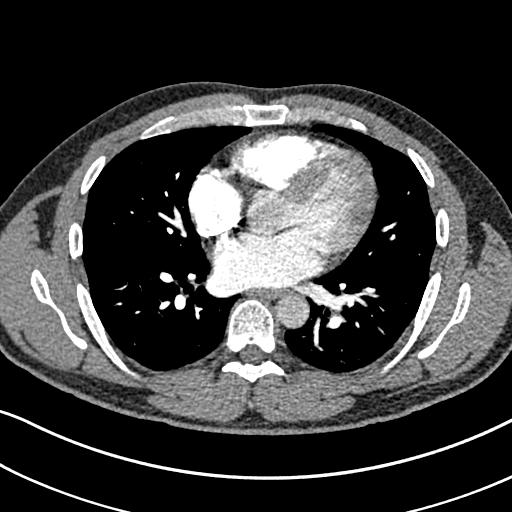
[im 205/352  lung]
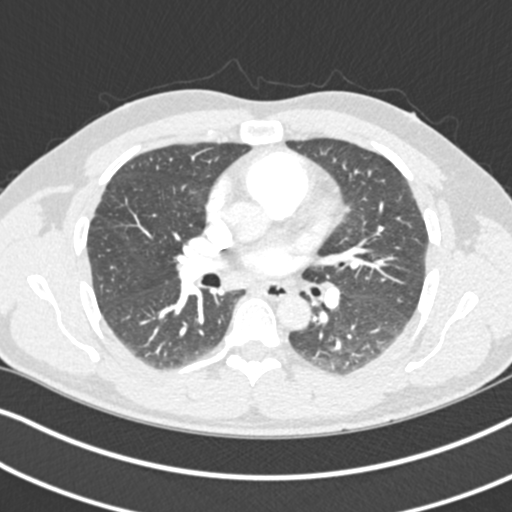
[im 220/352  soft-tissue]
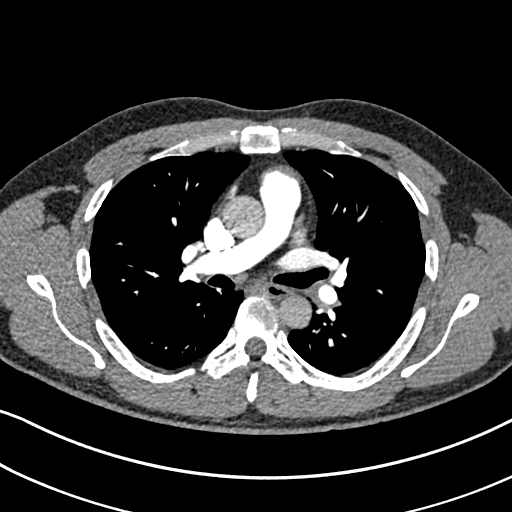
[im 249/352  lung]
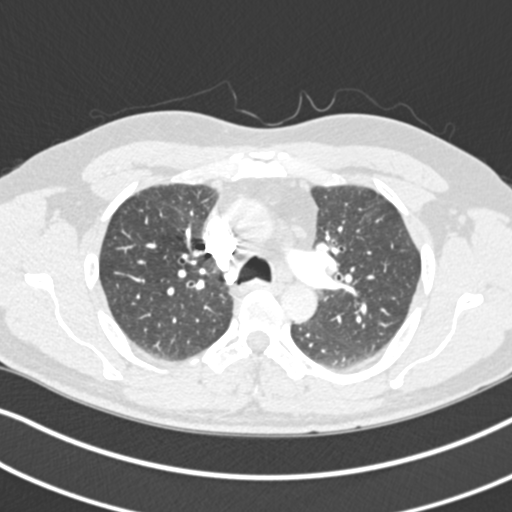
[im 264/352  soft-tissue]
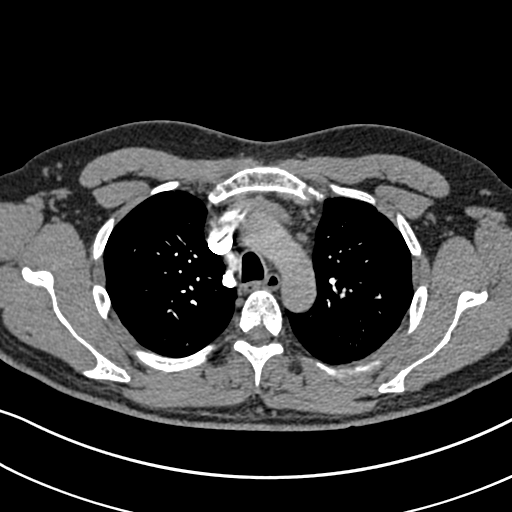
[im 293/352  lung]
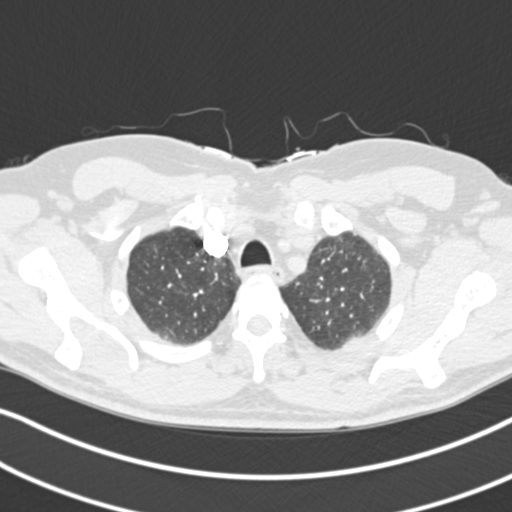
[im 308/352  soft-tissue]
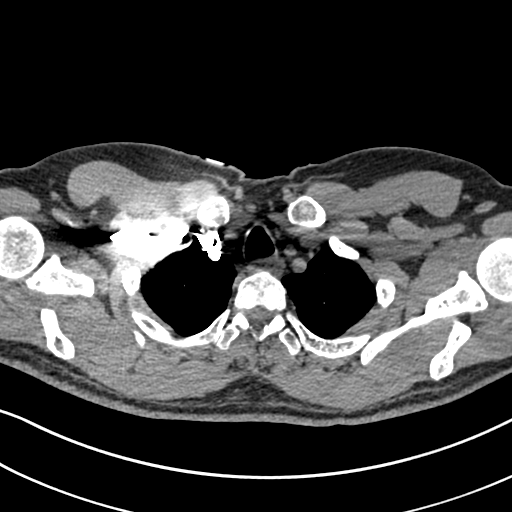
[im 337/352  lung]
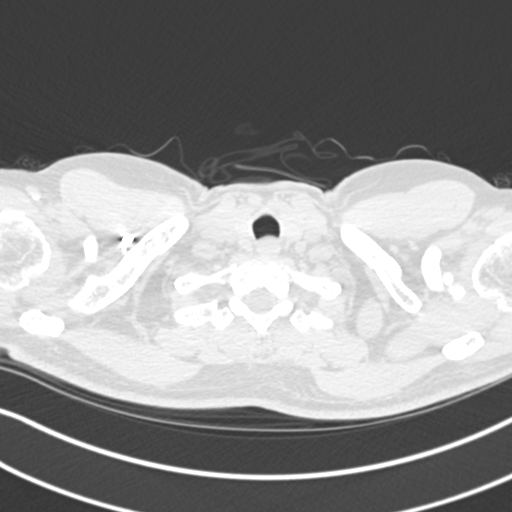

[Series 7: coronal mpr · coronal · 0.58mm/px · 3 of 86 slices shown]
[im 22/86  soft-tissue]
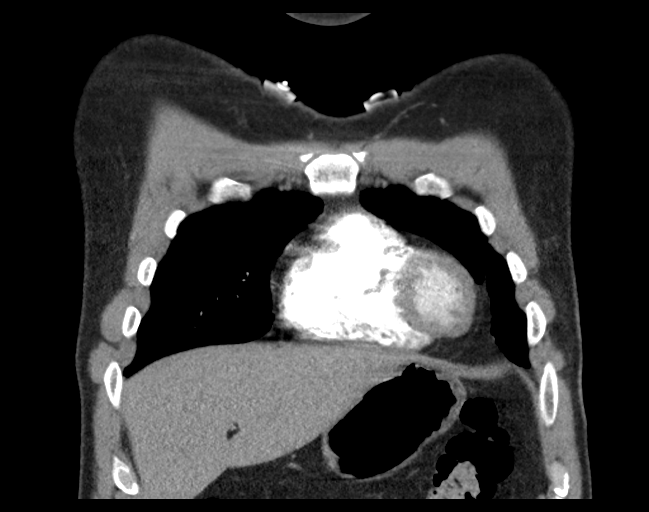
[im 43/86  soft-tissue]
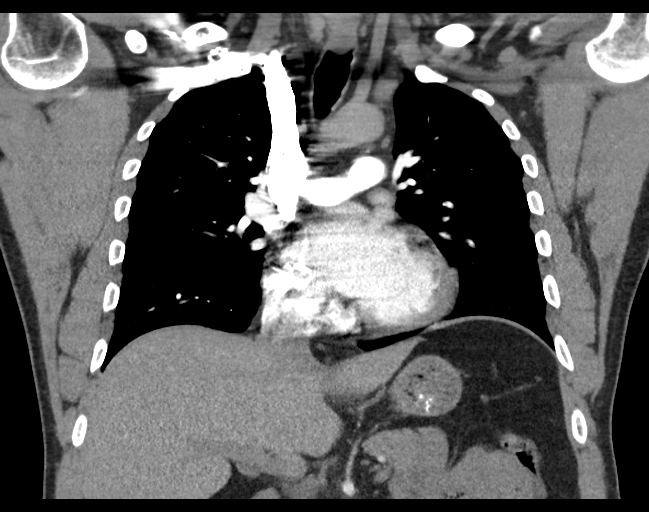
[im 64/86  soft-tissue]
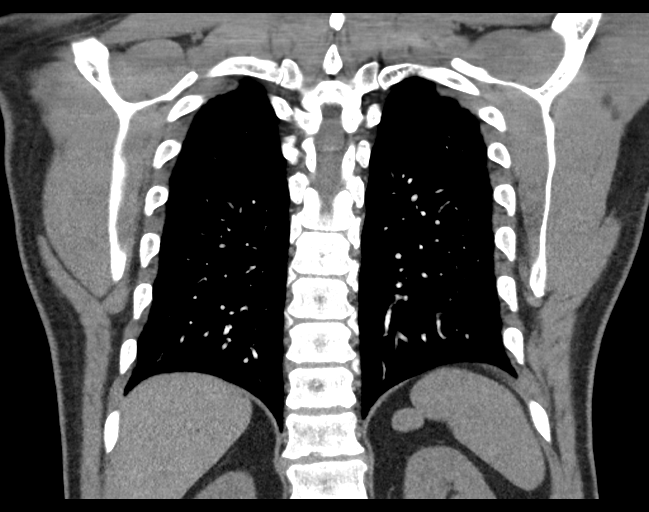

[18 of 46 positions shown; findings below may reference images not displayed]

FINDINGS: Cardiovascular: Satisfactory opacification of the pulmonary arteries
to the segmental level. No evidence of pulmonary embolism. Normal
heart size. No pericardial effusion.

Mediastinum/Nodes: No enlarged mediastinal, hilar, or axillary lymph
nodes. Thyroid gland, trachea, and esophagus demonstrate no
significant findings.

Lungs/Pleura: Lungs are clear. No pleural effusion or pneumothorax.

Upper Abdomen: No acute abnormality.

Musculoskeletal: No chest wall abnormality. No acute or significant
osseous findings.

Review of the MIP images confirms the above findings.
IMPRESSION: Negative exam. No evidence for acute pulmonary embolus.

## 2023-02-07 IMAGING — CR DG CHEST 2V
1 series · 2 of 2 positions shown · non-contrast
Comparison: None.

CLINICAL DATA: Chest pain.

EXAM:
CHEST - 2 VIEW

[Series 1: dg chest 2 view · 0.14mm/px · 2 of 2 slices shown]
[im 1/2]
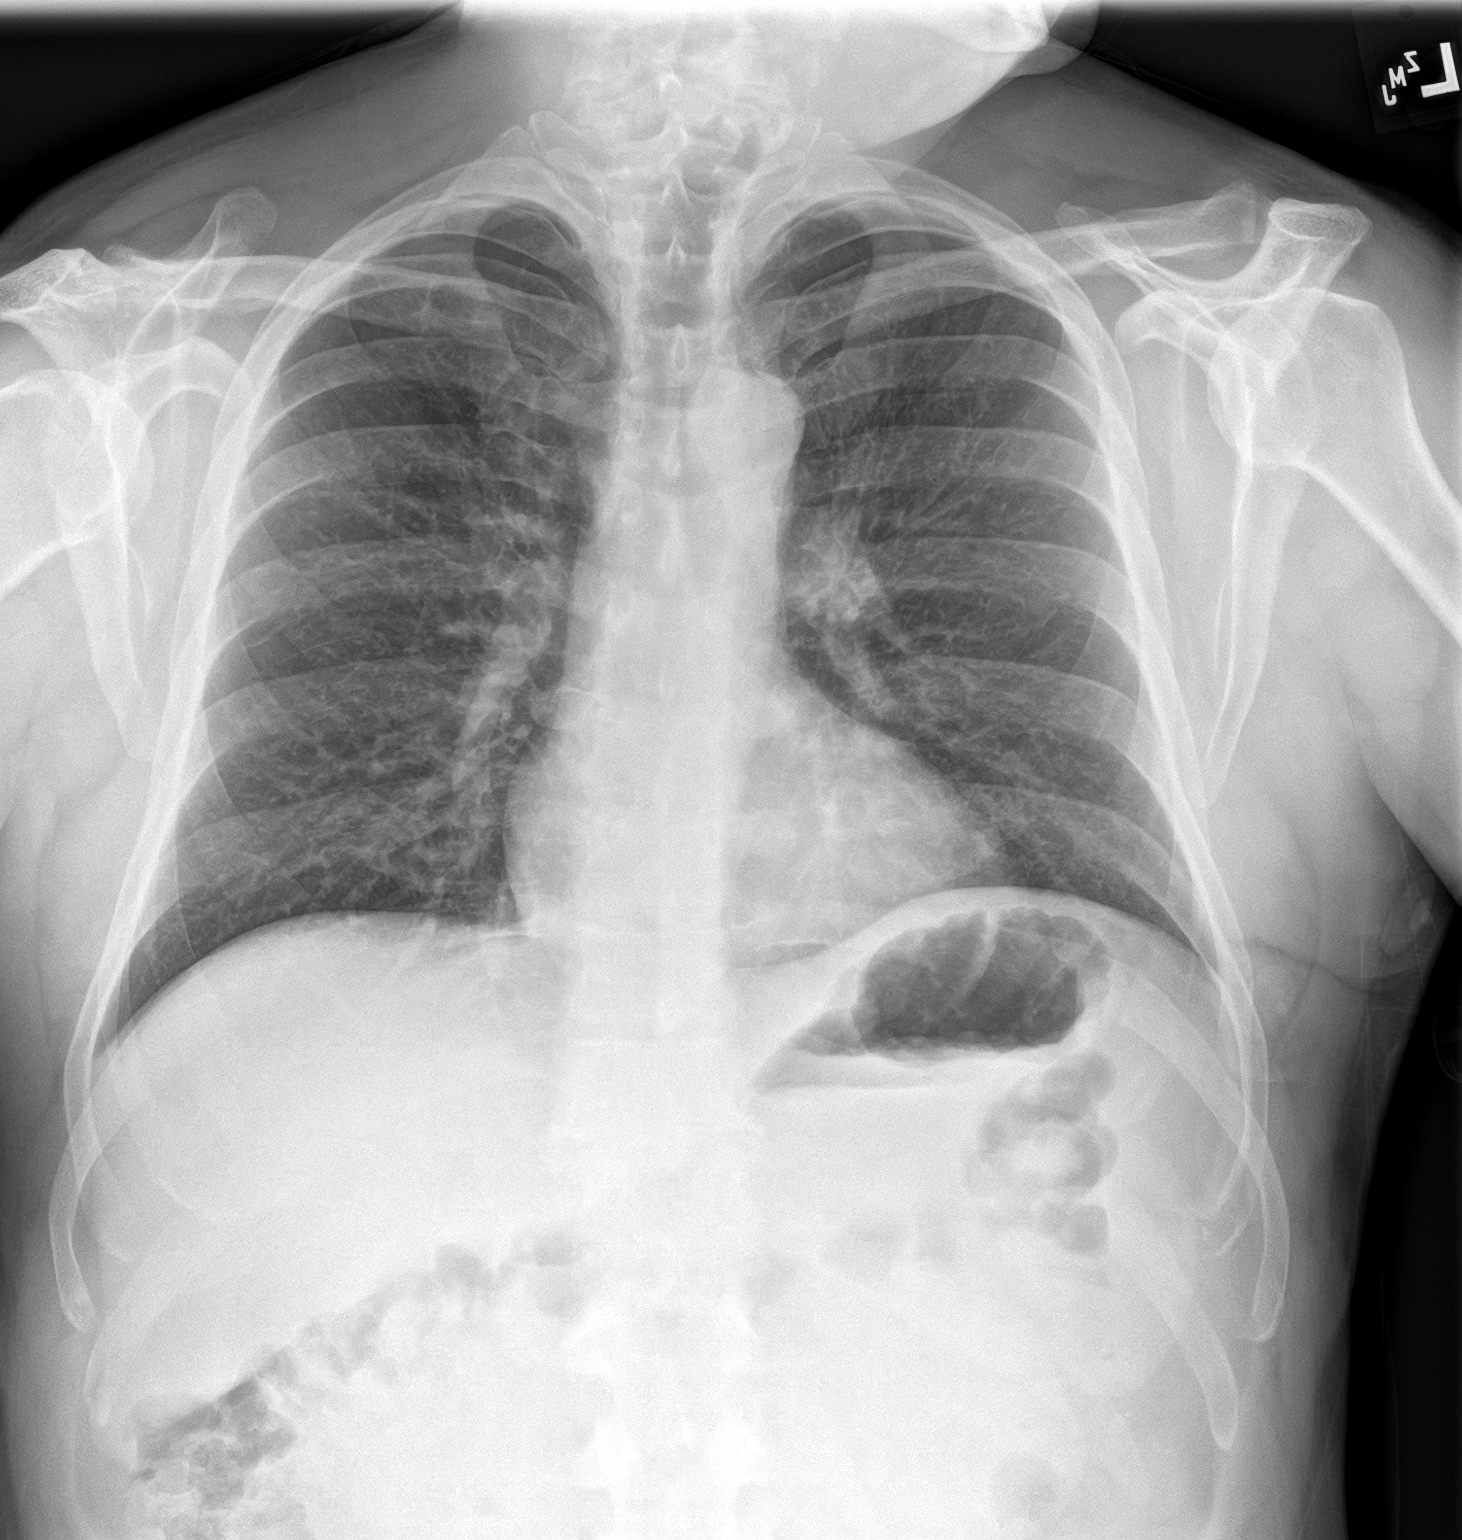
[im 2/2]
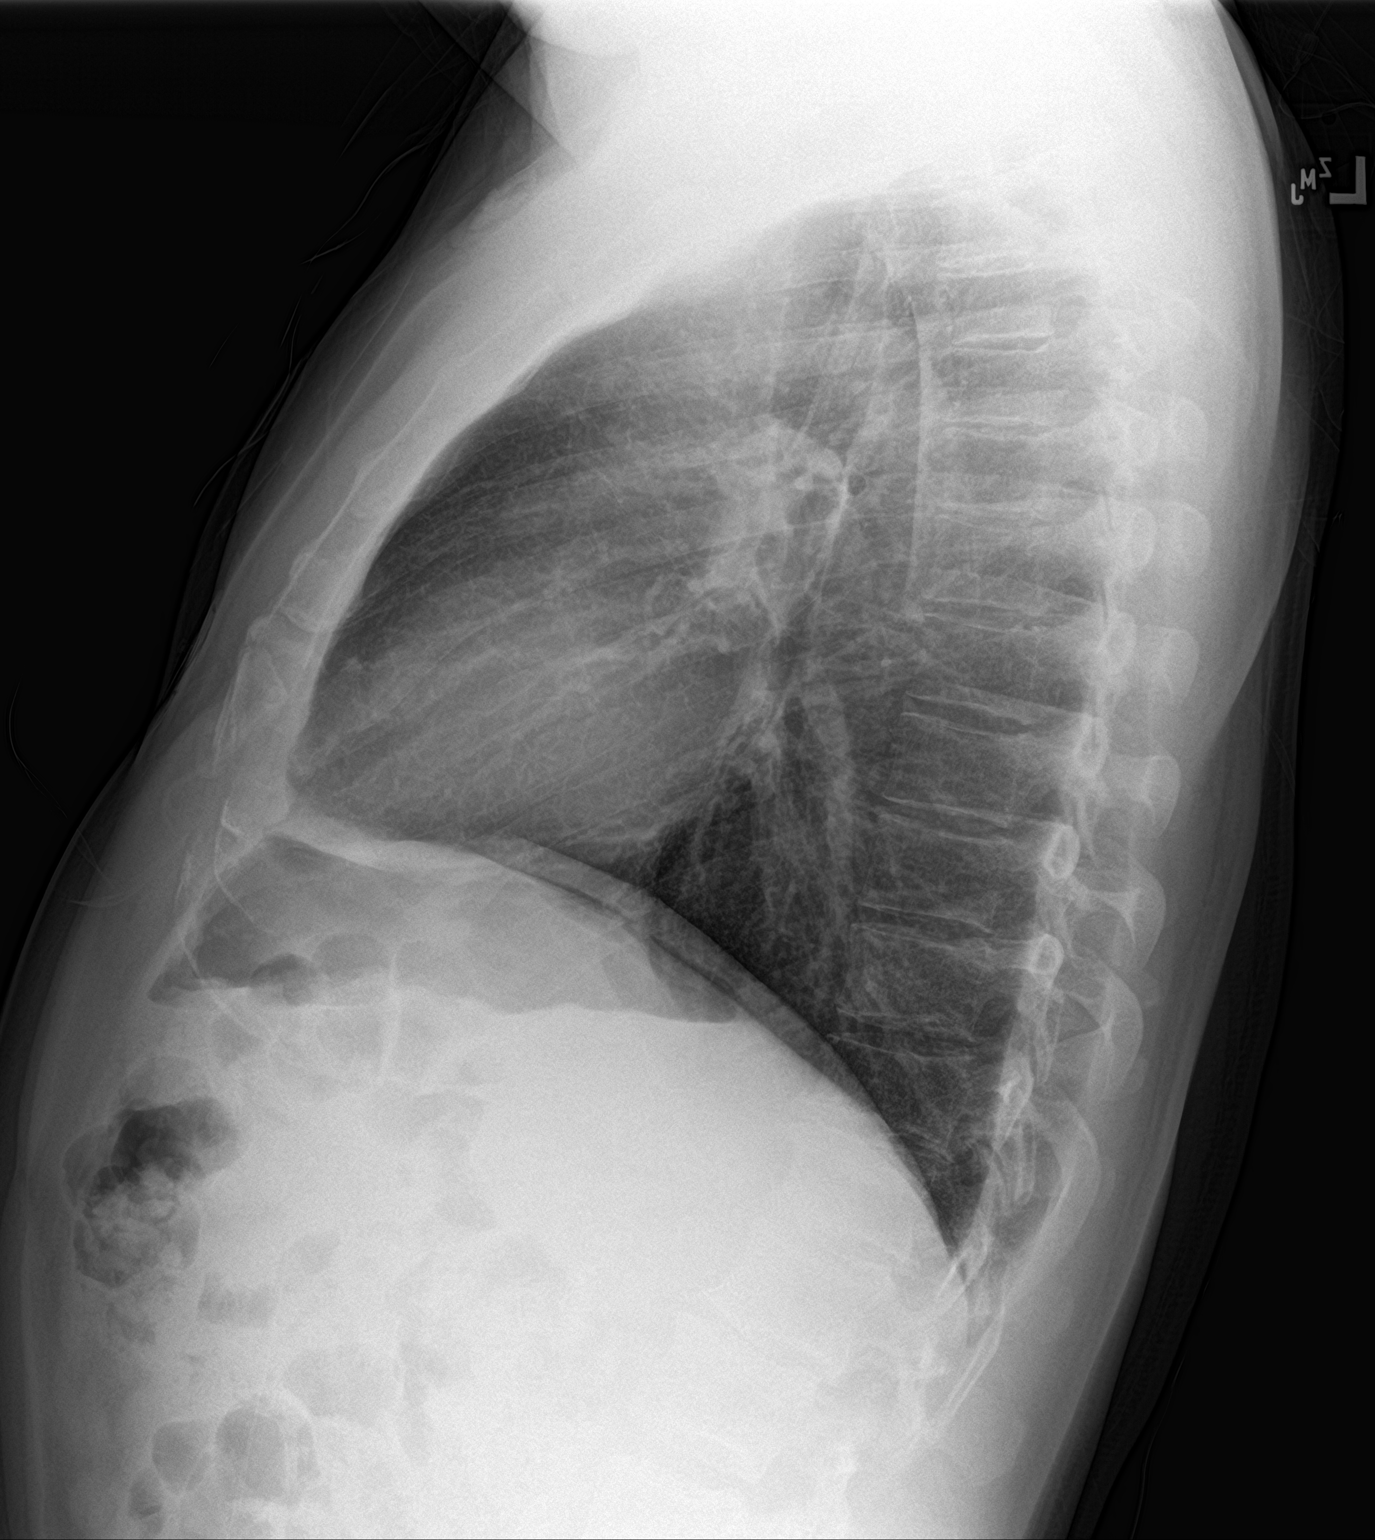

[2 of 2 positions shown; findings below may reference images not displayed]

FINDINGS: The heart size and mediastinal contours are within normal limits.
Both lungs are clear. The visualized skeletal structures are
unremarkable.
IMPRESSION: No active cardiopulmonary disease.

## 2023-02-09 ENCOUNTER — Telehealth: Payer: Self-pay | Admitting: Nurse Practitioner

## 2023-02-09 NOTE — Telephone Encounter (Signed)
Called and LVM asking for the patient to please return my call. Patient is to be taking the 150 mg and the 75 mg of Venlafaxine for a total of 225 mg daily. There is not one tablet for this dose.   OK for [facility] to relay the above information to the patient if he calls back.

## 2023-02-09 NOTE — Telephone Encounter (Signed)
Pt. Given message, verbalizes understanding. States "I'm not going to take two different pill doses. Maybe she can do something else." Please advise pt.

## 2023-02-09 NOTE — Telephone Encounter (Signed)
Medication Refill - Medication: venlafaxine XR (EFFEXOR XR) 75 MG 24 hr capsule   Pt stated his dose of medication, venlafaxine XR (EFFEXOR XR) was recently increased, but he is unsure of the exact MG. Pt is requesting this be refilled with the correct dose.  Has the patient contacted their pharmacy? Yes.    (Agent: If yes, when and what did the pharmacy advise?)  Preferred Pharmacy (with phone number or street name):  [facility] [phone] [place], [place] - [address]  [address] [place] [address]  Phone: [phone] Fax: [phone]  Hours: Not open 24 hours   Has the patient been seen for an appointment in the last year OR does the patient have an upcoming appointment? Yes.    Agent: Please be advised that RX refills may take up to 3 business days. We ask that you follow-up with your pharmacy.

## 2023-02-09 NOTE — Telephone Encounter (Signed)
Patient needs new prescription for venlafaxine XR, it was increased 02/03/23 by provider to 225 mg. Routing for approval.

## 2023-02-25 ENCOUNTER — Other Ambulatory Visit: Payer: Self-pay | Admitting: Nurse Practitioner

## 2023-02-26 NOTE — Telephone Encounter (Signed)
Requested Prescriptions  Refused Prescriptions Disp Refills   busPIRone (BUSPAR) 10 MG tablet [Pharmacy Med Name: BUSPIRONE HCL 10 MG TABLET] 540 tablet 1    Sig: TAKE 2 TABLETS BY MOUTH 3 TIMES DAILY.     Psychiatry: Anxiolytics/Hypnotics - Non-controlled Passed - 02/25/2023 12:30 PM      Passed - Valid encounter within last 12 months    Recent Outpatient Visits           3 weeks ago Cardiomyopathy, unspecified type [facility])   [facility] Larae Grooms, NP   4 weeks ago Acute non-recurrent frontal sinusitis   [place] [facility] Larae Grooms, NP   2 months ago Anxiety   [place] [facility] Larae Grooms, NP   3 months ago Persistent atrial fibrillation [facility])   [place] [facility] Larae Grooms, NP   5 months ago Cardiomyopathy, unspecified type [facility])   [place] [facility] Larae Grooms, NP       Future Appointments             In 1 month Camnitz, Andree Coss, MD [facility]   In 1 month Larae Grooms, NP [facility], [facility]

## 2023-03-17 DIAGNOSIS — M25562 Pain in left knee: Secondary | ICD-10-CM | POA: Diagnosis not present

## 2023-03-24 ENCOUNTER — Encounter: Payer: Self-pay | Admitting: Family Medicine

## 2023-03-24 ENCOUNTER — Ambulatory Visit (INDEPENDENT_AMBULATORY_CARE_PROVIDER_SITE_OTHER): Payer: Medicaid Other | Admitting: Family Medicine

## 2023-03-24 VITALS — BP 157/89 | HR 77 | Temp 97.9°F | Wt 157.8 lb

## 2023-03-24 DIAGNOSIS — F322 Major depressive disorder, single episode, severe without psychotic features: Secondary | ICD-10-CM

## 2023-03-24 NOTE — Assessment & Plan Note (Addendum)
Chronic, ongoing. PHQ 9= 16, GAD 7= 21 today. Recommend evaluation by [facility] for inpatient behavioral health management, follow up after discharge. Continue taking Buspar 60mg  and 75mg  Effexor.

## 2023-03-24 NOTE — Patient Instructions (Addendum)
[facility] ED for admittance
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
## 2023-04-02 ENCOUNTER — Other Ambulatory Visit: Payer: Self-pay | Admitting: Nurse Practitioner

## 2023-04-02 NOTE — Telephone Encounter (Signed)
Requested medications are due for refill today.  unsure  Requested medications are on the active medications list.  ye  Last refill. 08/18/2022  Future visit scheduled.   yes  Notes to clinic.  Medication is historical.    Requested Prescriptions  Pending Prescriptions Disp Refills   ELIQUIS 5 MG TABS tablet [Pharmacy Med Name: ELIQUIS 5 MG TABLET] 180 tablet     Sig: TAKE 1 TABLET BY MOUTH TWICE A DAY     Hematology:  Anticoagulants - apixaban Passed - 04/02/2023  2:02 PM      Passed - PLT in normal range and within 360 days    Platelets  Date Value Ref Range Status  01/30/2023 322 150 - 400 K/uL Final  11/18/2021 226 150 - 450 x10E3/uL Final         Passed - HGB in normal range and within 360 days    Hemoglobin  Date Value Ref Range Status  01/30/2023 15.7 13.0 - 17.0 g/dL Final  16/07/9603 54.0 13.0 - 17.7 g/dL Final         Passed - HCT in normal range and within 360 days    HCT  Date Value Ref Range Status  01/30/2023 45.1 39.0 - 52.0 % Final   Hematocrit  Date Value Ref Range Status  11/18/2021 44.6 37.5 - 51.0 % Final         Passed - Cr in normal range and within 360 days    Creatinine, Ser  Date Value Ref Range Status  01/30/2023 0.99 0.61 - 1.24 mg/dL Final         Passed - AST in normal range and within 360 days    AST  Date Value Ref Range Status  11/04/2022 23 0 - 40 IU/L Final         Passed - ALT in normal range and within 360 days    ALT  Date Value Ref Range Status  11/04/2022 27 0 - 44 IU/L Final         Passed - Valid encounter within last 12 months    Recent Outpatient Visits           1 week ago Current severe episode of major depressive disorder without psychotic features, unspecified whether recurrent (HCC)   [facility] Pearley, Sherran Needs, NP   1 month ago Cardiomyopathy, unspecified type [facility])   [place] [facility] Larae Grooms, NP   2 months ago Acute non-recurrent frontal  sinusitis   [place] [facility] Larae Grooms, NP   3 months ago Anxiety   [facility] Larae Grooms, NP   4 months ago Persistent atrial fibrillation [facility])   [place] [facility] Larae Grooms, NP       Future Appointments             Tomorrow Elberta Fortis, Andree Coss, MD [facility]   In 1 week Larae Grooms, NP [facility]   In 1 month Simmons-Robinson, [place], MD [facility]

## 2023-04-03 ENCOUNTER — Ambulatory Visit: Payer: Medicaid Other | Attending: Cardiology | Admitting: Cardiology

## 2023-04-03 ENCOUNTER — Encounter: Payer: Self-pay | Admitting: Cardiology

## 2023-04-03 NOTE — Progress Notes (Unsigned)
This encounter was created in error - please disregard.

## 2023-04-15 ENCOUNTER — Encounter: Payer: Medicaid Other | Admitting: Nurse Practitioner

## 2023-04-23 DIAGNOSIS — H5213 Myopia, bilateral: Secondary | ICD-10-CM | POA: Diagnosis not present

## 2023-05-11 ENCOUNTER — Encounter: Payer: Self-pay | Admitting: Nurse Practitioner
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
## 2023-05-19 ENCOUNTER — Encounter: Payer: Self-pay | Admitting: Cardiology

## 2023-05-19 ENCOUNTER — Ambulatory Visit: Payer: Medicaid Other | Attending: Cardiology | Admitting: Cardiology

## 2023-05-19 VITALS — BP 124/68 | HR 72 | Ht 66.0 in | Wt 162.4 lb

## 2023-05-19 DIAGNOSIS — I493 Ventricular premature depolarization: Secondary | ICD-10-CM | POA: Diagnosis not present

## 2023-05-19 DIAGNOSIS — I480 Paroxysmal atrial fibrillation: Secondary | ICD-10-CM

## 2023-05-19 DIAGNOSIS — I4892 Unspecified atrial flutter: Secondary | ICD-10-CM

## 2023-05-19 NOTE — Progress Notes (Unsigned)
Electrophysiology Office Note:   Date:  05/19/2023  ID:  Patrick Short, DOB [birth year], MRN [id number]  Primary Cardiologist: Yvonne Kendall, MD Electrophysiologist: Lanier Prude, MD  {Click to update primary MD,subspecialty MD or APP then REFRESH:1}    History of Present Illness:   Patrick Short is a 47 y.o. male with h/o atrial fibrillation seen today for routine electrophysiology followup.  Since last being seen in our clinic the patient reports doing.  He has had minimal episodes of atrial fibrillation.  He does state that he has palpitations a few times a week, but does not feel that we need to make any major changes to his medication regimen.  He was started on diltiazem at his last general cardiology visit and feels that his atrial fibrillation has been better controlled since then.  Metoprolol stopped as he had an allergic reaction with concern of issues with an EpiPen and beta-blockers.  he denies chest pain, palpitations, dyspnea, PND, orthopnea, nausea, vomiting, dizziness, syncope, edema, weight gain, or early satiety.     History of paroxysmal atrial fibrillation status post ablation, cardiomyopathy (presumed nonischemic with LVEF of 40-45% by TEE at time of atrial fibrillation ablation in 10/2021 and normalization on repeat echo in 05/2022), hypertension, anxiety, depression, and PTSD, who presents for follow-up after ED visit about 2 weeks ago for lightheadedness.  EKGs showed atrial flutter (initially with 2:1 AV block and subsequently variable block).  He was given his home regimen of flecainide and metoprolol       Review of systems complete and found to be negative unless listed in HPI.   EP Information / Studies Reviewed:    EKG is ordered today. Personal review as below.  EKG Interpretation Date/Time:  Tuesday May 19 2023 10:13:21 EDT Ventricular Rate:  72 PR Interval:  162 QRS Duration:  80 QT Interval:  404 QTC Calculation: 442 R Axis:   7  Text  Interpretation: Sinus rhythm with frequent Premature ventricular complexes Nonspecific ST abnormality When compared with ECG of 30-Jan-2023 12:44, No significant change since last tracing Confirmed by ,  ([id number]) on 05/19/2023 10:20:10 AM     Risk Assessment/Calculations:    CHA2DS2-VASc Score =    {Confirm score is correct.  If not, click here to update score.  REFRESH note.  :1} This indicates a  % annual risk of stroke. The patient's score is based upon: CHF History: 1 HTN History: 1 Age Score: 0 Gender Score: 0   {This patient has a significant risk of stroke if diagnosed with atrial fibrillation.  Please consider VKA or DOAC agent for anticoagulation if the bleeding risk is acceptable.   You can also use the SmartPhrase .[email or web address] for documentation.   :[id number]}          Physical Exam:   VS:  BP 124/68 (BP Location: Left Arm, Patient Position: Sitting, Cuff Size: Normal)   Pulse 72   Ht 5\' 6"  (1.676 m)   Wt 162 lb 6.4 oz (73.7 kg)   SpO2 97%   BMI 26.21 kg/m    Wt Readings from Last 3 Encounters:  05/19/23 162 lb 6.4 oz (73.7 kg)  03/24/23 157 lb 12.8 oz (71.6 kg)  02/03/23 159 lb 12.8 oz (72.5 kg)     GEN: Well nourished, well developed in no acute distress NECK: No JVD; No carotid bruits CARDIAC: Regular rate and rhythm with occasional ectopy, no murmurs, rubs, gallops RESPIRATORY:  Clear to auscultation without rales, wheezing or rhonchi  ABDOMEN:  Soft, non-tender, non-distended EXTREMITIES:  No edema; No deformity   ASSESSMENT AND PLAN:    1.  Paroxysmal atrial fibrillation/atrial flutter: Status post ablation January 2023.  He is continue to have short episodes of atrial fibrillation but otherwise feels well.  He was started on flecainide did not tolerate the medication.  He is having minimal episodes of atrial fibrillation.  Happy with his control.  No changes.  2.  PVCs: Noted on EKG today.  If he continues to have palpitations, 2-week monitor may  be beneficial for PVC burden.  Follow up with [facility] Clinic in 6 months  Signed,  [name], MD

## 2023-05-19 NOTE — Patient Instructions (Signed)
Medication Instructions:  Your physician recommends that you continue on your current medications as directed. Please refer to the Current Medication list given to you today.  *If you need a refill on your cardiac medications before your next appointment, please call your pharmacy*   Lab Work: None ordered   Testing/Procedures: None ordered   Follow-Up: At [facility], you and your health needs are our priority.  As part of our continuing mission to provide you with exceptional heart care, we have created designated Provider Care Teams.  These Care Teams include your primary Cardiologist (physician) and Advanced Practice Providers (APPs -  Physician Assistants and Nurse Practitioners) who all work together to provide you with the care you need, when you need it.  Your next appointment:   6 month(s)  The format for your next appointment:   In Person  Provider:   You will follow up in the Atrial Fibrillation Clinic located at [facility]. Your provider will be: Clint R. Fenton, PA-C  Or  Jon Suarez, PA  Thank you for choosing [facility]!!    Sherri Price, RN [phone]          

## 2023-05-20 ENCOUNTER — Encounter: Payer: Self-pay | Admitting: Family Medicine

## 2023-05-20 ENCOUNTER — Ambulatory Visit: Payer: Medicaid Other | Admitting: Family Medicine

## 2023-05-20 VITALS — BP 138/86 | HR 71 | Temp 97.8°F | Resp 12 | Ht 66.0 in | Wt 165.1 lb

## 2023-05-20 DIAGNOSIS — Z720 Tobacco use: Secondary | ICD-10-CM

## 2023-05-20 DIAGNOSIS — F4310 Post-traumatic stress disorder, unspecified: Secondary | ICD-10-CM

## 2023-05-20 DIAGNOSIS — F3289 Other specified depressive episodes: Secondary | ICD-10-CM | POA: Diagnosis not present

## 2023-05-20 DIAGNOSIS — R062 Wheezing: Secondary | ICD-10-CM

## 2023-05-20 DIAGNOSIS — G2581 Restless legs syndrome: Secondary | ICD-10-CM

## 2023-05-20 DIAGNOSIS — I10 Essential (primary) hypertension: Secondary | ICD-10-CM | POA: Diagnosis not present

## 2023-05-20 DIAGNOSIS — I4819 Other persistent atrial fibrillation: Secondary | ICD-10-CM | POA: Diagnosis not present

## 2023-05-20 DIAGNOSIS — F419 Anxiety disorder, unspecified: Secondary | ICD-10-CM | POA: Diagnosis not present

## 2023-05-20 DIAGNOSIS — J4540 Moderate persistent asthma, uncomplicated: Secondary | ICD-10-CM

## 2023-05-20 MED ORDER — ALBUTEROL SULFATE (2.5 MG/3ML) 0.083% IN NEBU: 2.5000 mg | INHALATION_SOLUTION | Freq: Four times a day (QID) | RESPIRATORY_TRACT | 1 refills | Status: DC | PRN

## 2023-05-20 MED ORDER — ALBUTEROL SULFATE HFA 108 (90 BASE) MCG/ACT IN AERS
INHALATION_SPRAY | RESPIRATORY_TRACT | 1 refills | Status: DC
Start: 2023-05-20 — End: 2023-12-10

## 2023-05-20 MED ORDER — LORAZEPAM 1 MG PO TABS
1.0000 mg | ORAL_TABLET | Freq: Two times a day (BID) | ORAL | 0 refills | Status: DC | PRN
Start: 2023-05-20 — End: 2023-06-10

## 2023-05-20 NOTE — Assessment & Plan Note (Signed)
Managed with Requip. -Chronic.improved on medication  -Continue Requip as prescribed.

## 2023-05-20 NOTE — Assessment & Plan Note (Signed)
Chronic anxiety and PTSD with poor response to multiple medications in the past. Currently off Ativan for about a month. Reports constant racing thoughts and jitteriness. -Refer to psychiatry for further evaluation and management. -Resume Ativan 1mg  twice daily as needed for anxiety until psychiatry appointment.

## 2023-05-20 NOTE — Assessment & Plan Note (Signed)
Chronic  Symptoms improved with reported Ativan previously prescribed by the [facility]

## 2023-05-20 NOTE — Assessment & Plan Note (Signed)
History of hypertension, currently managed with unspecified heart medication. -Continue diltiazem 240mg  daily  -continue follow up with cardiology as scheduled
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
## 2023-05-22 ENCOUNTER — Telehealth: Payer: Self-pay | Admitting: Family Medicine

## 2023-05-22 DIAGNOSIS — J4540 Moderate persistent asthma, uncomplicated: Secondary | ICD-10-CM | POA: Insufficient documentation

## 2023-05-22 NOTE — Telephone Encounter (Signed)
[employer] fax requesting asthma related office note from 05/20/23 visit and signed order confirmation  Both faxed as requested  Ronnald Ramp, MD  [facility]

## 2023-05-22 NOTE — Assessment & Plan Note (Signed)
Asthma Recently diagnosed, managed with Albuterol. -Refilled Albuterol prescription. -prescribed albuterol nebulizer solution

## 2023-05-26 ENCOUNTER — Encounter: Payer: Self-pay | Admitting: Family Medicine

## 2023-05-28 ENCOUNTER — Emergency Department (HOSPITAL_COMMUNITY)
Admission: EM | Admit: 2023-05-28 | Discharge: 2023-05-28 | Disposition: A | Discharge status: Home / Self Care | Payer: Medicaid Other | Attending: Emergency Medicine | Admitting: Emergency Medicine

## 2023-05-28 ENCOUNTER — Emergency Department (HOSPITAL_COMMUNITY): Payer: Medicaid Other

## 2023-05-28 ENCOUNTER — Encounter (HOSPITAL_COMMUNITY): Payer: Self-pay

## 2023-05-28 ENCOUNTER — Emergency Department (HOSPITAL_BASED_OUTPATIENT_CLINIC_OR_DEPARTMENT_OTHER): Payer: Medicaid Other

## 2023-05-28 ENCOUNTER — Other Ambulatory Visit: Payer: Self-pay

## 2023-05-28 DIAGNOSIS — M7989 Other specified soft tissue disorders: Secondary | ICD-10-CM

## 2023-05-28 DIAGNOSIS — R0902 Hypoxemia: Secondary | ICD-10-CM | POA: Diagnosis not present

## 2023-05-28 DIAGNOSIS — Z7901 Long term (current) use of anticoagulants: Secondary | ICD-10-CM | POA: Insufficient documentation

## 2023-05-28 DIAGNOSIS — M79662 Pain in left lower leg: Secondary | ICD-10-CM | POA: Diagnosis not present

## 2023-05-28 DIAGNOSIS — R0789 Other chest pain: Secondary | ICD-10-CM | POA: Diagnosis not present

## 2023-05-28 DIAGNOSIS — I499 Cardiac arrhythmia, unspecified: Secondary | ICD-10-CM | POA: Diagnosis not present

## 2023-05-28 DIAGNOSIS — R079 Chest pain, unspecified: Secondary | ICD-10-CM | POA: Diagnosis not present

## 2023-05-28 DIAGNOSIS — I10 Essential (primary) hypertension: Secondary | ICD-10-CM | POA: Diagnosis not present

## 2023-05-28 DIAGNOSIS — R000 Tachycardia, unspecified: Secondary | ICD-10-CM | POA: Diagnosis not present

## 2023-05-28 LAB — CBC
HCT: 46 % (ref 39.0–52.0)
Hemoglobin: 15.9 g/dL (ref 13.0–17.0)
MCH: 32.5 pg (ref 26.0–34.0)
MCHC: 34.6 g/dL (ref 30.0–36.0)
MCV: 94.1 fL (ref 80.0–100.0)
Platelets: 218 10*3/uL (ref 150–400)
RBC: 4.89 MIL/uL (ref 4.22–5.81)
RDW: 11.8 % (ref 11.5–15.5)
WBC: 16.7 10*3/uL — ABNORMAL HIGH (ref 4.0–10.5)
nRBC: 0 % (ref 0.0–0.2)

## 2023-05-28 LAB — BASIC METABOLIC PANEL
Anion gap: 11 (ref 5–15)
BUN: 11 mg/dL (ref 6–20)
CO2: 22 mmol/L (ref 22–32)
Calcium: 9.1 mg/dL (ref 8.9–10.3)
Chloride: 103 mmol/L (ref 98–111)
Creatinine, Ser: 1.07 mg/dL (ref 0.61–1.24)
GFR, Estimated: >60 mL/min (ref >60)
Glucose, Bld: 104 mg/dL — ABNORMAL HIGH (ref 70–99)
Potassium: 3.3 mmol/L — ABNORMAL LOW (ref 3.5–5.1)
Sodium: 136 mmol/L (ref 135–145)

## 2023-05-28 LAB — TROPONIN I (HIGH SENSITIVITY)
Troponin I (High Sensitivity): 7 ng/L (ref <18)
Troponin I (High Sensitivity): 8 ng/L (ref <18)

## 2023-05-28 MED ORDER — IOHEXOL 350 MG/ML SOLN
75.0000 mL | Freq: Once | INTRAVENOUS | Status: AC | PRN
  Administered 2023-05-28: 75 mL via INTRAVENOUS

## 2023-05-28 MED ORDER — FENTANYL CITRATE PF 50 MCG/ML IJ SOSY
50.0000 ug | PREFILLED_SYRINGE | Freq: Once | INTRAMUSCULAR | Status: AC
  Administered 2023-05-28: 50 ug via INTRAVENOUS
  Filled 2023-05-28: qty 1

## 2023-05-28 MED ORDER — ACETAMINOPHEN 500 MG PO TABS
1000.0000 mg | ORAL_TABLET | Freq: Once | ORAL | Status: AC
  Administered 2023-05-28: 1000 mg via ORAL
  Filled 2023-05-28: qty 2

## 2023-05-28 MED ORDER — POTASSIUM CHLORIDE CRYS ER 20 MEQ PO TBCR
40.0000 meq | EXTENDED_RELEASE_TABLET | Freq: Once | ORAL | Status: AC
  Administered 2023-05-28: 40 meq via ORAL
  Filled 2023-05-28: qty 2

## 2023-05-28 MED ORDER — LORAZEPAM 2 MG/ML IJ SOLN
1.0000 mg | Freq: Once | INTRAMUSCULAR | Status: AC
  Administered 2023-05-28: 1 mg via INTRAVENOUS
  Filled 2023-05-28: qty 1

## 2023-05-28 MED ORDER — BENZONATATE 100 MG PO CAPS
100.0000 mg | ORAL_CAPSULE | Freq: Once | ORAL | Status: AC
  Administered 2023-05-28: 100 mg via ORAL
  Filled 2023-05-28: qty 1

## 2023-05-28 NOTE — Discharge Instructions (Signed)
You were seen in the emergency department for your chest pain and your leg pain.  Your workup showed no signs of blood clots, heart attack or infection of your lungs and no signs of blood clots in your legs.  Your potassium was mildly low which might be causing some muscle cramps in your leg which we repleted for you in the emergency department.  You can follow-up with your primary doctor in the next few days to have your symptoms rechecked.  You should return to the emergency department if you are having significantly worsening chest pain, severe shortness of breath, leg swelling that is getting worse and not going away or if you have any other new or concerning symptoms.

## 2023-05-28 NOTE — ED Notes (Signed)
Discharge instructions reviewed with pt and family at bedside. Pt verbalized understanding and had no questions. Pt a&ox4 and in no acute distress.

## 2023-05-28 NOTE — ED Triage Notes (Signed)
PER EMS: pt is from home with c/o chest pain that started today about 0100, worse when breathing, associated with pain in his left leg. EKG showed PACs and PVCs. Recently diagnosed with lung cancer.   18g Right forearm  BP-174/96, HR-106, 96% RA, CBG-77
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
## 2023-05-28 NOTE — Progress Notes (Signed)
Left lower extremity venous study completed.   Preliminary results relayed to MD and RN in ER.  Please see CV Procedures for preliminary results.   , RVT  9:06 AM 05/28/23

## 2023-05-28 NOTE — ED Provider Notes (Signed)
Patient was signed out to me at 0700 by Dr. Clayborne Dana pending CT PE and lower extremity Doppler.  In short this is a 47 year old male with a past medical history of lung cancer not currently on treatment and A-fib on Eliquis that presented to the emergency department with chest pain and left calf pain.  The patient had workup including EKG and labs.  EKG had PVCs but no acute ischemic changes.  Troponins have been negative.  Potassium was mildly low.  Due to new cancer history will undergo CT PE and lower extremity Doppler study.  Clinical Course as of 05/28/23 1553  Thu May 28, 2023  7829 Upon reevaluation, the patient was asleep in bed resting comfortably feeling well without acute complaints.  Patient's CT and lower extremity Doppler were negative.  Heart is regular rate and rhythm, lungs are clear to auscultation bilaterally.  He has no lower extremity edema and no calf tenderness at this time.  He stable for discharge home with outpatient follow-up and was given strict return precautions. [VK]    Clinical Course User Index [VK] Rexford Maus, DO      Rexford Maus, [place] 05/28/23 1553

## 2023-06-04 ENCOUNTER — Other Ambulatory Visit: Payer: Self-pay | Admitting: Family Medicine

## 2023-06-04 DIAGNOSIS — F419 Anxiety disorder, unspecified: Secondary | ICD-10-CM

## 2023-06-10 ENCOUNTER — Other Ambulatory Visit: Payer: Self-pay | Admitting: Family Medicine

## 2023-06-10 ENCOUNTER — Telehealth: Payer: Self-pay

## 2023-06-10 DIAGNOSIS — F419 Anxiety disorder, unspecified: Secondary | ICD-10-CM

## 2023-06-10 NOTE — Telephone Encounter (Signed)
Copied from CRM [id number]. Topic: General - Inquiry >> Jun 10, 2023 10:47 AM Patrick Short wrote: Patient called and stated he needs two copies of diagnoses & treatment, please advise.  Patients callback # [phone]

## 2023-06-10 NOTE — Telephone Encounter (Signed)
Medication Refill - Medication:  LORazepam (ATIVAN) 1 MG tablet   rOPINIRole (REQUIP) 1 MG tablet  completely out   *Patient aware on the last refill request of 06/04/2023 for LORazepam (ATIVAN) 1 MG tablet that stated refill requested too soon*  Has the patient contacted their pharmacy? Yes.   Advised to contact PCP  Preferred Pharmacy (with phone number or street name):  [id number] - [place],  - [address]  Phone: [phone] Fax: [phone]  Has the patient been seen for an appointment in the last year OR does the patient have an upcoming appointment? YES , follow up scheduled for 9.11.2024

## 2023-06-11 MED ORDER — ROPINIROLE HCL 1 MG PO TABS: 1.0000 mg | ORAL_TABLET | Freq: Every day | ORAL | 0 refills | Status: DC

## 2023-06-11 MED ORDER — LORAZEPAM 1 MG PO TABS
1.0000 mg | ORAL_TABLET | Freq: Two times a day (BID) | ORAL | 0 refills | Status: DC | PRN
Start: 2023-06-16 — End: 2023-07-20

## 2023-06-11 NOTE — Telephone Encounter (Signed)
Requested medication (s) are due for refill today: na   Requested medication (s) are on the active medication list: yes  Last refill:  05/20/23 #30 0 refills  Future visit scheduled: yes in 1 week   Notes to clinic:  not delegated per protocol. Last refill 05/20/23. Do you want to refill Rx?     Requested Prescriptions  Pending Prescriptions Disp Refills   LORazepam (ATIVAN) 1 MG tablet 30 tablet 0    Sig: Take 1 tablet (1 mg total) by mouth 2 (two) times daily as needed for anxiety.     Not Delegated - Psychiatry: Anxiolytics/Hypnotics 2 Failed - 06/10/2023 11:09 AM      Failed - This refill cannot be delegated      Failed - Urine Drug Screen completed in last 360 days      Passed - Patient is not pregnant      Passed - Valid encounter within last 6 months    Recent Outpatient Visits           3 weeks ago Anxiety   [place] [facility] Simmons-Robinson, [place], MD   2 months ago Current severe episode of major depressive disorder without psychotic features, unspecified whether recurrent (HCC)   [place] [facility] Pearley, Sherran Needs, NP   4 months ago Cardiomyopathy, unspecified type [facility])   [place] [facility] Larae Grooms, NP   4 months ago Acute non-recurrent frontal sinusitis   [place] [facility] Larae Grooms, NP   6 months ago Anxiety   [place] [facility] Larae Grooms, NP       Future Appointments             In 1 week Simmons-Robinson, Tawanna Cooler, MD [facility]            Signed Prescriptions Disp Refills   rOPINIRole (REQUIP) 1 MG tablet 90 tablet 0    Sig: Take 1 tablet (1 mg total) by mouth at bedtime.     Neurology:  Parkinsonian Agents Passed - 06/10/2023 11:09 AM      Passed - Last BP in normal range    BP Readings from Last 1 Encounters:  05/28/23 109/76         Passed - Last Heart Rate in normal range     Pulse Readings from Last 1 Encounters:  05/28/23 80         Passed - Valid encounter within last 12 months    Recent Outpatient Visits           3 weeks ago Anxiety   [place] [facility] Simmons-Robinson, [place], MD   2 months ago Current severe episode of major depressive disorder without psychotic features, unspecified whether recurrent (HCC)   [place] [facility] Pearley, Sherran Needs, NP   4 months ago Cardiomyopathy, unspecified type [facility])   [place] [facility] Larae Grooms, NP   4 months ago Acute non-recurrent frontal sinusitis   [place] [facility] Larae Grooms, NP   6 months ago Anxiety   [place] [facility] Larae Grooms, NP       Future Appointments             In 1 week Simmons-Robinson, Tawanna Cooler, MD [facility], [facility]

## 2023-06-11 NOTE — Telephone Encounter (Signed)
Requested by patient. Future visit in 1 week .  Requested Prescriptions  Pending Prescriptions Disp Refills   LORazepam (ATIVAN) 1 MG tablet 30 tablet 0    Sig: Take 1 tablet (1 mg total) by mouth 2 (two) times daily as needed for anxiety.     Not Delegated - Psychiatry: Anxiolytics/Hypnotics 2 Failed - 06/10/2023 11:09 AM      Failed - This refill cannot be delegated      Failed - Urine Drug Screen completed in last 360 days      Passed - Patient is not pregnant      Passed - Valid encounter within last 6 months    Recent Outpatient Visits           3 weeks ago Anxiety   [place] [facility] Simmons-Robinson, [place], MD   2 months ago Current severe episode of major depressive disorder without psychotic features, unspecified whether recurrent (HCC)   [place] [facility] Pearley, Sherran Needs, NP   4 months ago Cardiomyopathy, unspecified type [facility])   [place] [facility] Larae Grooms, NP   4 months ago Acute non-recurrent frontal sinusitis   [place] [facility] Larae Grooms, NP   6 months ago Anxiety   [place] [facility] Larae Grooms, NP       Future Appointments             In 1 week Simmons-Robinson, Tawanna Cooler, MD [place] [facility]             rOPINIRole (REQUIP) 1 MG tablet 90 tablet 0    Sig: Take 1 tablet (1 mg total) by mouth at bedtime.     Neurology:  Parkinsonian Agents Passed - 06/10/2023 11:09 AM      Passed - Last BP in normal range    BP Readings from Last 1 Encounters:  05/28/23 109/76         Passed - Last Heart Rate in normal range    Pulse Readings from Last 1 Encounters:  05/28/23 80         Passed - Valid encounter within last 12 months    Recent Outpatient Visits           3 weeks ago Anxiety   [place] [facility] Simmons-Robinson, [place], MD   2 months ago Current severe episode of major  depressive disorder without psychotic features, unspecified whether recurrent (HCC)   [place] [facility] Pearley, Sherran Needs, NP   4 months ago Cardiomyopathy, unspecified type [facility])   [place] [facility] Larae Grooms, NP   4 months ago Acute non-recurrent frontal sinusitis    [facility] Larae Grooms, NP   6 months ago Anxiety    [facility] [facility] Larae Grooms, NP       Future Appointments             In 1 week Simmons-Robinson, Tawanna Cooler, MD [facility], [facility]

## 2023-06-24 ENCOUNTER — Ambulatory Visit: Payer: Medicaid Other | Admitting: Family Medicine

## 2023-06-24 NOTE — Progress Notes (Deleted)
Established patient visit   Patient: Patrick Short   DOB: [birth year]   47 y.o. Male  MRN: [id number] Visit Date: 06/24/2023  Today's healthcare provider: Ronnald Ramp, MD   No chief complaint on file.  Subjective       Discussed the use of AI scribe software for clinical note transcription with the patient, who gave verbal consent to proceed.  History of Present Illness             Past Medical History:  Diagnosis Date   Allergy    Anxiety    Arthritis    Blood transfusion without reported diagnosis    Cancer (HCC)    Clotting disorder (HCC)    Depression    Dysrhythmia    Hypertension    Mineral deficiency    Neuromuscular disorder (HCC)    Paroxysmal atrial fibrillation (HCC)    PTSD (post-traumatic stress disorder)     Medications: Outpatient Medications Prior to Visit  Medication Sig   albuterol (PROVENTIL) (2.5 MG/3ML) 0.083% nebulizer solution Take 3 mLs (2.5 mg total) by nebulization every 6 (six) hours as needed for wheezing or shortness of breath.   albuterol (VENTOLIN HFA) 108 (90 Base) MCG/ACT inhaler TAKE 2 PUFFS BY MOUTH EVERY 6 HOURS AS NEEDED FOR WHEEZE OR SHORTNESS OF BREATH   apixaban (ELIQUIS) 5 MG TABS tablet TAKE 1 TABLET BY MOUTH TWICE A DAY   diltiazem (CARDIZEM CD) 240 MG 24 hr capsule Take 1 capsule (240 mg total) by mouth daily.   EPINEPHrine 0.3 mg/0.3 mL IJ SOAJ injection Inject 0.3 mg into the muscle as needed for anaphylaxis.   LORazepam (ATIVAN) 1 MG tablet Take 1 tablet (1 mg total) by mouth 2 (two) times daily as needed for anxiety.   rOPINIRole (REQUIP) 1 MG tablet Take 1 tablet (1 mg total) by mouth at bedtime.   traZODone (DESYREL) 50 MG tablet Take 1-2 tablets (50-100 mg total) by mouth at bedtime as needed. for sleep   No facility-administered medications prior to visit.    Review of Systems  {Insert previous labs (optional):23779} {See past labs  Heme  Chem  Endocrine  Serology  Results Review  (optional):1}   Objective    There were no vitals taken for this visit. {Insert last BP/Wt (optional):23777}{See vitals history (optional):1}   Physical Exam  ***  No results found for any visits on 06/24/23.  Assessment & Plan     Problem List Items Addressed This Visit     Anxiety - Primary    Assessment and Plan              No follow-ups on file.         Ronnald Ramp, MD  [facility] [phone] (phone) [phone] (fax)  [facility]

## 2023-06-25 ENCOUNTER — Other Ambulatory Visit: Payer: Self-pay | Admitting: Internal Medicine

## 2023-06-25 NOTE — Telephone Encounter (Signed)
Left voicemail to schedule follow up appt

## 2023-06-25 NOTE — Telephone Encounter (Signed)
Please contact pt for future appointment. Pt due for follow up.

## 2023-06-29 ENCOUNTER — Telehealth: Payer: Self-pay | Admitting: Family Medicine

## 2023-06-29 NOTE — Telephone Encounter (Signed)
Pt reports that his anxiety is terrible and he would like to ask if it is possible for him to get a Rx for LORazepam (ATIVAN) 1 MG tablet that will last him more than 3 weeks.

## 2023-06-30 ENCOUNTER — Telehealth: Payer: Self-pay | Admitting: Family Medicine

## 2023-06-30 NOTE — Telephone Encounter (Signed)
Patient called back to f/u on the request for Ativan.

## 2023-07-02 ENCOUNTER — Ambulatory Visit: Payer: Medicaid Other | Admitting: Family Medicine

## 2023-07-02 NOTE — Progress Notes (Deleted)
Established patient visit   Patient: Patrick Short   DOB: 09-04-1976   47 y.o. Male  MRN: [id number] Visit Date: 07/02/2023  Today's healthcare provider: Ronnald Ramp, MD   No chief complaint on file.  Subjective       Discussed the use of AI scribe software for clinical note transcription with the patient, who gave verbal consent to proceed.  History of Present Illness             Past Medical History:  Diagnosis Date   Allergy    Anxiety    Arthritis    Blood transfusion without reported diagnosis    Cancer (HCC)    Clotting disorder (HCC)    Depression    Dysrhythmia    Hypertension    Mineral deficiency    Neuromuscular disorder (HCC)    Paroxysmal atrial fibrillation (HCC)    PTSD (post-traumatic stress disorder)     Medications: Outpatient Medications Prior to Visit  Medication Sig   albuterol (PROVENTIL) (2.5 MG/3ML) 0.083% nebulizer solution Take 3 mLs (2.5 mg total) by nebulization every 6 (six) hours as needed for wheezing or shortness of breath.   albuterol (VENTOLIN HFA) 108 (90 Base) MCG/ACT inhaler TAKE 2 PUFFS BY MOUTH EVERY 6 HOURS AS NEEDED FOR WHEEZE OR SHORTNESS OF BREATH   apixaban (ELIQUIS) 5 MG TABS tablet TAKE 1 TABLET BY MOUTH TWICE A DAY   diltiazem (CARDIZEM CD) 240 MG 24 hr capsule Take 1 capsule (240 mg total) by mouth daily. Overdue follow up visit.  PLEASE CALL OFFICE TO SCHEDULE APPOINTMENT PRIOR TO NEXT REFILL   EPINEPHrine 0.3 mg/0.3 mL IJ SOAJ injection Inject 0.3 mg into the muscle as needed for anaphylaxis.   LORazepam (ATIVAN) 1 MG tablet Take 1 tablet (1 mg total) by mouth 2 (two) times daily as needed for anxiety.   rOPINIRole (REQUIP) 1 MG tablet Take 1 tablet (1 mg total) by mouth at bedtime.   traZODone (DESYREL) 50 MG tablet Take 1-2 tablets (50-100 mg total) by mouth at bedtime as needed. for sleep   No facility-administered medications prior to visit.    Review of Systems  {Insert previous labs  (optional):23779} {See past labs  Heme  Chem  Endocrine  Serology  Results Review (optional):1}   Objective    There were no vitals taken for this visit. {Insert last BP/Wt (optional):23777}{See vitals history (optional):1}   Physical Exam  ***  No results found for any visits on 07/02/23.  Assessment & Plan     Problem List Items Addressed This Visit   None   Assessment and Plan              No follow-ups on file.         Ronnald Ramp, MD  [facility] [phone] (phone) [phone] (fax)  [facility]

## 2023-07-13 DIAGNOSIS — J449 Chronic obstructive pulmonary disease, unspecified: Secondary | ICD-10-CM | POA: Diagnosis not present

## 2023-07-20 ENCOUNTER — Encounter: Payer: Self-pay | Admitting: Family Medicine

## 2023-07-20 ENCOUNTER — Ambulatory Visit (INDEPENDENT_AMBULATORY_CARE_PROVIDER_SITE_OTHER): Payer: Medicaid Other | Admitting: Family Medicine

## 2023-07-20 VITALS — BP 140/88 | HR 70 | Temp 98.6°F | Ht 66.0 in | Wt 165.0 lb

## 2023-07-20 DIAGNOSIS — F4310 Post-traumatic stress disorder, unspecified: Secondary | ICD-10-CM | POA: Diagnosis not present

## 2023-07-20 DIAGNOSIS — I10 Essential (primary) hypertension: Secondary | ICD-10-CM | POA: Diagnosis not present

## 2023-07-20 DIAGNOSIS — Z23 Encounter for immunization: Secondary | ICD-10-CM | POA: Diagnosis not present

## 2023-07-20 DIAGNOSIS — I4811 Longstanding persistent atrial fibrillation: Secondary | ICD-10-CM | POA: Diagnosis not present

## 2023-07-20 DIAGNOSIS — F419 Anxiety disorder, unspecified: Secondary | ICD-10-CM

## 2023-07-20 MED ORDER — LORAZEPAM 1 MG PO TABS
1.0000 mg | ORAL_TABLET | Freq: Two times a day (BID) | ORAL | 0 refills | Status: DC
Start: 2023-07-20 — End: 2023-08-26

## 2023-07-20 NOTE — Assessment & Plan Note (Signed)
Elevated blood pressure noted during visit. Currently on Diltiazem 240mg  daily. -Chronic  -Continue Diltiazem 240mg  daily. -Cardiology follow-up on 07/30/2023.

## 2023-07-20 NOTE — Assessment & Plan Note (Signed)
Daily use of Ativan 1mg  twice a day. History of multiple failed treatments including Wellbutrin, Paxil, Zoloft, Celexa, and Ketamine infusions. Patient reports significant benefit from Ativan. -Continue Ativan 1mg  twice daily. -Plan for psychiatric consultation at the end of November. -Consider counseling for PTSD and anxiety.

## 2023-07-20 NOTE — Assessment & Plan Note (Signed)
History of ablation in 2022 with initial improvement, but symptoms returned after 7-8 months. Multiple cardiology specialists involved in care. Chronic  Continue eliquis 5mg  BID  -Continue current management under cardiology.
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
## 2023-07-20 NOTE — Assessment & Plan Note (Signed)
He has tried multiple medications for anxiety in the past, including Wellbutrin, Paxil, and Zoloft, but discontinued due to intolerable side effects or ineffectiveness. He also underwent ketamine infusions, which he reports were not well-tolerated. Recommended follow up as scheduled with therapist/counselor for PTSD symptoms  Continue ativan 1mg  twice daily (pt has been taking daily so will change frequency from PRN to BID)

## 2023-07-28 ENCOUNTER — Other Ambulatory Visit: Payer: Self-pay | Admitting: Internal Medicine

## 2023-07-29 ENCOUNTER — Ambulatory Visit (HOSPITAL_COMMUNITY): Payer: Medicaid Other | Admitting: Student

## 2023-08-20 ENCOUNTER — Ambulatory Visit: Payer: Medicaid Other | Admitting: Family Medicine

## 2023-08-20 NOTE — Progress Notes (Deleted)
      Established patient visit   Patient: Patrick Short   DOB: November 11, 1975   47 y.o. Male  MRN: [id number] Visit Date: 08/20/2023  Today's healthcare provider: Ronnald Ramp, MD   No chief complaint on file.  Subjective       Discussed the use of AI scribe software for clinical note transcription with the patient, who gave verbal consent to proceed.  History of Present Illness          Reports anxiety is no longer responsive to ativan 1mg  BID Previously attempted medications other than benzodiazepines include Wellbutrin, Paxil, Zoloft, Buspar.    Past Medical History:  Diagnosis Date   Allergy    Anxiety    Arthritis    Blood transfusion without reported diagnosis    Cancer (HCC)    Clotting disorder (HCC)    Depression    Dysrhythmia    Hypertension    Mineral deficiency    Neuromuscular disorder (HCC)    Paroxysmal atrial fibrillation (HCC)    PTSD (post-traumatic stress disorder)     Medications: Outpatient Medications Prior to Visit  Medication Sig   albuterol (PROVENTIL) (2.5 MG/3ML) 0.083% nebulizer solution Take 3 mLs (2.5 mg total) by nebulization every 6 (six) hours as needed for wheezing or shortness of breath.   albuterol (VENTOLIN HFA) 108 (90 Base) MCG/ACT inhaler TAKE 2 PUFFS BY MOUTH EVERY 6 HOURS AS NEEDED FOR WHEEZE OR SHORTNESS OF BREATH   apixaban (ELIQUIS) 5 MG TABS tablet TAKE 1 TABLET BY MOUTH TWICE A DAY   diltiazem (CARDIZEM CD) 240 MG 24 hr capsule Take 1 capsule (240 mg total) by mouth daily.   EPINEPHrine 0.3 mg/0.3 mL IJ SOAJ injection Inject 0.3 mg into the muscle as needed for anaphylaxis.   LORazepam (ATIVAN) 1 MG tablet Take 1 tablet (1 mg total) by mouth 2 (two) times daily.   rOPINIRole (REQUIP) 1 MG tablet Take 1 tablet (1 mg total) by mouth at bedtime.   traZODone (DESYREL) 50 MG tablet Take 1-2 tablets (50-100 mg total) by mouth at bedtime as needed. for sleep   No facility-administered medications prior to visit.     Review of Systems  {Insert previous labs (optional):23779} {See past labs  Heme  Chem  Endocrine  Serology  Results Review (optional):1}   Objective    There were no vitals taken for this visit. {Insert last BP/Wt (optional):23777}{See vitals history (optional):1}      Physical Exam  ***  No results found for any visits on 08/20/23.  Assessment & Plan     Problem List Items Addressed This Visit   None   Assessment and Plan              No follow-ups on file.         Ronnald Ramp, MD  [facility] [phone] (phone) [phone] (fax)  [facility]

## 2023-08-24 NOTE — Progress Notes (Deleted)
      Established patient visit   Patient: Patrick Short   DOB: [birth year]   47 y.o. Male  MRN: [id number] Visit Date: 08/25/2023  Today's healthcare provider: Ronnald Ramp, MD   No chief complaint on file.  Subjective       Discussed the use of AI scribe software for clinical note transcription with the patient, who gave verbal consent to proceed.  History of Present Illness          Reports anxiety is no longer responsive to ativan 1mg  BID Previously attempted medications other than benzodiazepines include Wellbutrin, Paxil, Zoloft, Buspar.    Past Medical History:  Diagnosis Date  . Allergy   . Anxiety   . Arthritis   . Blood transfusion without reported diagnosis   . Cancer (HCC)   . Clotting disorder (HCC)   . Depression   . Dysrhythmia   . Hypertension   . Mineral deficiency   . Neuromuscular disorder (HCC)   . Paroxysmal atrial fibrillation (HCC)   . PTSD (post-traumatic stress disorder)     Medications: Outpatient Medications Prior to Visit  Medication Sig  . albuterol (PROVENTIL) (2.5 MG/3ML) 0.083% nebulizer solution Take 3 mLs (2.5 mg total) by nebulization every 6 (six) hours as needed for wheezing or shortness of breath.  Marland Kitchen albuterol (VENTOLIN HFA) 108 (90 Base) MCG/ACT inhaler TAKE 2 PUFFS BY MOUTH EVERY 6 HOURS AS NEEDED FOR WHEEZE OR SHORTNESS OF BREATH  . apixaban (ELIQUIS) 5 MG TABS tablet TAKE 1 TABLET BY MOUTH TWICE A DAY  . diltiazem (CARDIZEM CD) 240 MG 24 hr capsule Take 1 capsule (240 mg total) by mouth daily.  Marland Kitchen EPINEPHrine 0.3 mg/0.3 mL IJ SOAJ injection Inject 0.3 mg into the muscle as needed for anaphylaxis.  Marland Kitchen LORazepam (ATIVAN) 1 MG tablet Take 1 tablet (1 mg total) by mouth 2 (two) times daily.  Marland Kitchen rOPINIRole (REQUIP) 1 MG tablet Take 1 tablet (1 mg total) by mouth at bedtime.  . traZODone (DESYREL) 50 MG tablet Take 1-2 tablets (50-100 mg total) by mouth at bedtime as needed. for sleep   No facility-administered  medications prior to visit.    Review of Systems  {Insert previous labs (optional):23779} {See past labs  Heme  Chem  Endocrine  Serology  Results Review (optional):1}   Objective    There were no vitals taken for this visit. {Insert last BP/Wt (optional):23777}{See vitals history (optional):1}      Physical Exam  ***  No results found for any visits on 08/25/23.  Assessment & Plan     Problem List Items Addressed This Visit   None   Assessment and Plan              No follow-ups on file.         Ronnald Ramp, MD  [facility] [phone] (phone) [phone] (fax)  [facility]

## 2023-08-25 ENCOUNTER — Ambulatory Visit: Payer: Medicaid Other | Admitting: Family Medicine

## 2023-08-25 NOTE — Progress Notes (Unsigned)
      Established patient visit   Patient: JASIM MELITO   DOB: 1976-09-21   47 y.o. Male  MRN: [id number] Visit Date: 08/26/2023  Today's healthcare provider: Ronnald Ramp, MD   No chief complaint on file.  Subjective       Discussed the use of AI scribe software for clinical note transcription with the patient, who gave verbal consent to proceed.  History of Present Illness             Past Medical History:  Diagnosis Date   Allergy    Anxiety    Arthritis    Blood transfusion without reported diagnosis    Cancer (HCC)    Clotting disorder (HCC)    Depression    Dysrhythmia    Hypertension    Mineral deficiency    Neuromuscular disorder (HCC)    Paroxysmal atrial fibrillation (HCC)    PTSD (post-traumatic stress disorder)     Medications: Outpatient Medications Prior to Visit  Medication Sig   albuterol (PROVENTIL) (2.5 MG/3ML) 0.083% nebulizer solution Take 3 mLs (2.5 mg total) by nebulization every 6 (six) hours as needed for wheezing or shortness of breath.   albuterol (VENTOLIN HFA) 108 (90 Base) MCG/ACT inhaler TAKE 2 PUFFS BY MOUTH EVERY 6 HOURS AS NEEDED FOR WHEEZE OR SHORTNESS OF BREATH   apixaban (ELIQUIS) 5 MG TABS tablet TAKE 1 TABLET BY MOUTH TWICE A DAY   diltiazem (CARDIZEM CD) 240 MG 24 hr capsule Take 1 capsule (240 mg total) by mouth daily.   EPINEPHrine 0.3 mg/0.3 mL IJ SOAJ injection Inject 0.3 mg into the muscle as needed for anaphylaxis.   LORazepam (ATIVAN) 1 MG tablet Take 1 tablet (1 mg total) by mouth 2 (two) times daily.   rOPINIRole (REQUIP) 1 MG tablet Take 1 tablet (1 mg total) by mouth at bedtime.   traZODone (DESYREL) 50 MG tablet Take 1-2 tablets (50-100 mg total) by mouth at bedtime as needed. for sleep   No facility-administered medications prior to visit.    Review of Systems  {Insert previous labs (optional):23779} {See past labs  Heme  Chem  Endocrine  Serology  Results Review (optional):1}   Objective     There were no vitals taken for this visit. {Insert last BP/Wt (optional):23777}{See vitals history (optional):1}      Physical Exam  ***  No results found for any visits on 08/26/23.  Assessment & Plan     Problem List Items Addressed This Visit   None   Assessment and Plan              No follow-ups on file.         Ronnald Ramp, MD  [facility] [phone] (phone) [phone] (fax)  [facility]

## 2023-08-26 ENCOUNTER — Ambulatory Visit (INDEPENDENT_AMBULATORY_CARE_PROVIDER_SITE_OTHER): Payer: Medicaid Other | Admitting: Family Medicine

## 2023-08-26 ENCOUNTER — Encounter: Payer: Self-pay | Admitting: Family Medicine

## 2023-08-26 VITALS — BP 116/95 | HR 70 | Temp 98.1°F | Resp 18 | Ht 66.0 in | Wt 169.0 lb

## 2023-08-26 DIAGNOSIS — F4310 Post-traumatic stress disorder, unspecified: Secondary | ICD-10-CM | POA: Diagnosis not present

## 2023-08-26 DIAGNOSIS — F419 Anxiety disorder, unspecified: Secondary | ICD-10-CM

## 2023-08-26 MED ORDER — PRAZOSIN HCL 1 MG PO CAPS: 1.0000 mg | ORAL_CAPSULE | Freq: Every day | ORAL | 3 refills | Status: DC

## 2023-08-26 MED ORDER — CLONAZEPAM 0.5 MG PO TABS
0.5000 mg | ORAL_TABLET | Freq: Two times a day (BID) | ORAL | 0 refills | Status: DC
Start: 2023-08-26 — End: 2023-09-22
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
## 2023-08-27 NOTE — Assessment & Plan Note (Signed)
Presents for follow-up. Reports Ativan 1 mg twice daily is ineffective. Symptoms include irritability, overthinking, and restlessness. Discussed switching to Xanax 1 mg twice daily, noting its short-acting nature. Clonazepam considered for its longer duration but requires prior authorization. Prefers non-medication approaches due to past negative experiences. - chronic, not well controlled  - previously referred for psychiatry evaluation due to severity of anxiety and lack of response to prior agents, pt is waiting to reschedule appt  - Start clonazepam pending prior authorization - Continue Ativan until clonazepam is approved - Follow up in two weeks
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
## 2023-08-28 ENCOUNTER — Encounter: Payer: Self-pay | Admitting: Internal Medicine

## 2023-08-28 ENCOUNTER — Ambulatory Visit: Payer: Medicaid Other | Attending: Internal Medicine | Admitting: Internal Medicine

## 2023-08-28 VITALS — BP 112/80 | HR 68 | Ht 67.0 in | Wt 166.0 lb

## 2023-08-28 DIAGNOSIS — I428 Other cardiomyopathies: Secondary | ICD-10-CM | POA: Diagnosis not present

## 2023-08-28 DIAGNOSIS — I4819 Other persistent atrial fibrillation: Secondary | ICD-10-CM

## 2023-08-28 DIAGNOSIS — M79605 Pain in left leg: Secondary | ICD-10-CM | POA: Diagnosis not present

## 2023-08-28 DIAGNOSIS — I480 Paroxysmal atrial fibrillation: Secondary | ICD-10-CM | POA: Diagnosis not present

## 2023-08-28 MED ORDER — DILTIAZEM HCL ER COATED BEADS 240 MG PO CP24: 240.0000 mg | ORAL_CAPSULE | Freq: Every day | ORAL | 3 refills | Status: DC

## 2023-08-28 MED ORDER — APIXABAN 5 MG PO TABS: 5.0000 mg | ORAL_TABLET | Freq: Two times a day (BID) | ORAL | 3 refills | Status: DC

## 2023-08-28 NOTE — Patient Instructions (Signed)
 Medication Instructions:  No changes *If you need a refill on your cardiac medications before your next appointment, please call your pharmacy*   Lab Work: None ordered If you have labs (blood work) drawn today and your tests are completely normal, you will receive your results only by: MyChart Message (if you have MyChart) OR A paper copy in the mail If you have any lab test that is abnormal or we need to change your treatment, we will call you to review the results.   Testing/Procedures: None ordered   Follow-Up: At [facility], you and your health needs are our priority.  As part of our continuing mission to provide you with exceptional heart care, we have created designated Provider Care Teams.  These Care Teams include your primary Cardiologist (physician) and Advanced Practice Providers (APPs -  Physician Assistants and Nurse Practitioners) who all work together to provide you with the care you need, when you need it.  We recommend signing up for the patient portal called "MyChart".  Sign up information is provided on this After Visit Summary.  MyChart is used to connect with patients for Virtual Visits (Telemedicine).  Patients are able to view lab/test results, encounter notes, upcoming appointments, etc.  Non-urgent messages can be sent to your provider as well.   To learn more about what you can do with MyChart, go to [email or web address].    Your next appointment:   12 month(s)  Provider:   You may see Yvonne Kendall, MD or one of the following Advanced Practice Providers on your designated Care Team:   Nicolasa Ducking, NP Eula Listen, PA-C Cadence Fransico Michael, PA-C Charlsie Quest, NP Carlos Levering, NP

## 2023-09-07 NOTE — Progress Notes (Unsigned)
      Established patient visit   Patient: Patrick Short   DOB: June 03, 1976   47 y.o. Male  MRN: [id number] Visit Date: 09/08/2023  Today's healthcare provider: Ronnald Ramp, MD   No chief complaint on file.  Subjective       Discussed the use of AI scribe software for clinical note transcription with the patient, who gave verbal consent to proceed.  History of Present Illness             Past Medical History:  Diagnosis Date   Allergy    Anxiety    Arthritis    Blood transfusion without reported diagnosis    Cancer (HCC)    Clotting disorder (HCC)    Depression    Dysrhythmia    Hypertension    Mineral deficiency    Neuromuscular disorder (HCC)    Paroxysmal atrial fibrillation (HCC)    PTSD (post-traumatic stress disorder)     Medications: Outpatient Medications Prior to Visit  Medication Sig   albuterol (PROVENTIL) (2.5 MG/3ML) 0.083% nebulizer solution Take 3 mLs (2.5 mg total) by nebulization every 6 (six) hours as needed for wheezing or shortness of breath.   albuterol (VENTOLIN HFA) 108 (90 Base) MCG/ACT inhaler TAKE 2 PUFFS BY MOUTH EVERY 6 HOURS AS NEEDED FOR WHEEZE OR SHORTNESS OF BREATH   apixaban (ELIQUIS) 5 MG TABS tablet Take 1 tablet (5 mg total) by mouth 2 (two) times daily.   clonazePAM (KLONOPIN) 0.5 MG tablet Take 1 tablet (0.5 mg total) by mouth 2 (two) times daily.   diltiazem (CARDIZEM CD) 240 MG 24 hr capsule Take 1 capsule (240 mg total) by mouth daily.   EPINEPHrine 0.3 mg/0.3 mL IJ SOAJ injection Inject 0.3 mg into the muscle as needed for anaphylaxis.   prazosin (MINIPRESS) 1 MG capsule Take 1 capsule (1 mg total) by mouth at bedtime.   rOPINIRole (REQUIP) 1 MG tablet Take 1 tablet (1 mg total) by mouth at bedtime.   traZODone (DESYREL) 50 MG tablet Take 1-2 tablets (50-100 mg total) by mouth at bedtime as needed. for sleep   No facility-administered medications prior to visit.    Review of Systems  {Insert previous labs  (optional):23779} {See past labs  Heme  Chem  Endocrine  Serology  Results Review (optional):1}   Objective    There were no vitals taken for this visit. {Insert last BP/Wt (optional):23777}{See vitals history (optional):1}    Physical Exam  ***  No results found for any visits on 09/08/23.  Assessment & Plan     Problem List Items Addressed This Visit   None   Assessment and Plan              No follow-ups on file.         Ronnald Ramp, MD  [facility] [phone] (phone) [phone] (fax)  [facility]

## 2023-09-08 ENCOUNTER — Ambulatory Visit: Payer: Medicaid Other | Admitting: Family Medicine

## 2023-09-08 ENCOUNTER — Other Ambulatory Visit: Payer: Self-pay | Admitting: Physician Assistant

## 2023-09-08 VITALS — BP 136/80 | HR 90 | Resp 16 | Ht 67.0 in | Wt 168.0 lb

## 2023-09-08 DIAGNOSIS — F419 Anxiety disorder, unspecified: Secondary | ICD-10-CM | POA: Diagnosis not present

## 2023-09-08 DIAGNOSIS — I4819 Other persistent atrial fibrillation: Secondary | ICD-10-CM | POA: Diagnosis not present

## 2023-09-08 DIAGNOSIS — Z139 Encounter for screening, unspecified: Secondary | ICD-10-CM

## 2023-09-08 DIAGNOSIS — F4310 Post-traumatic stress disorder, unspecified: Secondary | ICD-10-CM

## 2023-09-08 DIAGNOSIS — I10 Essential (primary) hypertension: Secondary | ICD-10-CM | POA: Diagnosis not present

## 2023-09-08 NOTE — Telephone Encounter (Signed)
Requested Prescriptions  Pending Prescriptions Disp Refills   rOPINIRole (REQUIP) 1 MG tablet [Pharmacy Med Name: ROPINIROLE HCL 1 MG TABLET] 90 tablet 0    Sig: TAKE 1 TABLET BY MOUTH AT BEDTIME.     Neurology:  Parkinsonian Agents Passed - 09/08/2023  1:31 AM      Passed - Last BP in normal range    BP Readings from Last 1 Encounters:  08/28/23 112/80         Passed - Last Heart Rate in normal range    Pulse Readings from Last 1 Encounters:  08/28/23 68         Passed - Valid encounter within last 12 months    Recent Outpatient Visits           Today Anxiety   [facility] Simmons-Robinson, [place], MD   1 week ago Anxiety   [place] [facility] Carney, [place], MD   1 month ago Anxiety   [facility], [place], MD   3 months ago Anxiety    [facility] Cam[place], [place], MD   5 months ago Current severe episode of major depressive disorder without psychotic features, unspecified whether recurrent [facility])   [place] [facility] Weber Cooks, NP
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
## 2023-09-09 NOTE — Assessment & Plan Note (Signed)
Blood pressure today was 136/80 mmHg, slightly elevated. Attributed to stress and posture during measurement. Discussed stress management and regular monitoring. - Monitor blood pressure regularly - Implement stress management strategies - continue diltiazem 240mg  daily for Afib

## 2023-09-09 NOTE — Assessment & Plan Note (Signed)
Atrial fibrillation is managed with current medications. No new symptoms or concerns. - Continue current medications - Regular follow-up as needed

## 2023-09-09 NOTE — Assessment & Plan Note (Signed)
   Posttraumatic Stress Disorder (PTSD) PTSD is managed with current medications and therapy sessions with a VA coordinator. Significant stress related to financial and housing issues may exacerbate symptoms. Emphasized the importance of ongoing therapy and VA support. - Continue current medications - Continue therapy sessions with VA coordinator - Referral to social work for additional support

## 2023-09-09 NOTE — Assessment & Plan Note (Signed)
Anxiety, chronic Anxiety is improving significantly with clonazepam 0.5 mg once or twice daily as needed. Reports feeling much better and more level-headed compared to Ativan. Improved sleep with Minipress 1 mg at bedtime. Fiance also notices improvement. - Continue clonazepam 0.5 mg once or twice daily as needed - Continue Minipress 1 mg at bedtime - Referral to social work for assistance with VA benefits and housing issues

## 2023-09-11 ENCOUNTER — Other Ambulatory Visit: Payer: Self-pay

## 2023-09-11 ENCOUNTER — Emergency Department: Payer: Medicaid Other

## 2023-09-11 ENCOUNTER — Emergency Department
Admission: EM | Admit: 2023-09-11 | Discharge: 2023-09-12 | Disposition: A | Discharge status: Home / Self Care | Payer: Medicaid Other | Attending: Emergency Medicine | Admitting: Emergency Medicine

## 2023-09-11 DIAGNOSIS — Z20822 Contact with and (suspected) exposure to covid-19: Secondary | ICD-10-CM | POA: Insufficient documentation

## 2023-09-11 DIAGNOSIS — R0602 Shortness of breath: Secondary | ICD-10-CM | POA: Insufficient documentation

## 2023-09-11 DIAGNOSIS — I10 Essential (primary) hypertension: Secondary | ICD-10-CM | POA: Insufficient documentation

## 2023-09-11 DIAGNOSIS — R059 Cough, unspecified: Secondary | ICD-10-CM | POA: Diagnosis not present

## 2023-09-11 DIAGNOSIS — J069 Acute upper respiratory infection, unspecified: Secondary | ICD-10-CM

## 2023-09-11 DIAGNOSIS — R000 Tachycardia, unspecified: Secondary | ICD-10-CM | POA: Diagnosis not present

## 2023-09-11 DIAGNOSIS — R0689 Other abnormalities of breathing: Secondary | ICD-10-CM | POA: Diagnosis not present

## 2023-09-11 DIAGNOSIS — R069 Unspecified abnormalities of breathing: Secondary | ICD-10-CM | POA: Diagnosis not present

## 2023-09-11 LAB — CBC WITH DIFFERENTIAL/PLATELET
Abs Immature Granulocytes: 0.03 10*3/uL (ref 0.00–0.07)
Basophils Absolute: 0.1 10*3/uL (ref 0.0–0.1)
Basophils Relative: 1 %
Eosinophils Absolute: 0.2 10*3/uL (ref 0.0–0.5)
Eosinophils Relative: 2 %
HCT: 47.4 % (ref 39.0–52.0)
Hemoglobin: 16.7 g/dL (ref 13.0–17.0)
Immature Granulocytes: 0 %
Lymphocytes Relative: 16 %
Lymphs Abs: 1.4 10*3/uL (ref 0.7–4.0)
MCH: 34.5 pg — ABNORMAL HIGH (ref 26.0–34.0)
MCHC: 35.2 g/dL (ref 30.0–36.0)
MCV: 97.9 fL (ref 80.0–100.0)
Monocytes Absolute: 0.3 10*3/uL (ref 0.1–1.0)
Monocytes Relative: 4 %
Neutro Abs: 7 10*3/uL (ref 1.7–7.7)
Neutrophils Relative %: 77 %
Platelets: 238 10*3/uL (ref 150–400)
RBC: 4.84 MIL/uL (ref 4.22–5.81)
RDW: 12.2 % (ref 11.5–15.5)
WBC: 9 10*3/uL (ref 4.0–10.5)
nRBC: 0 % (ref 0.0–0.2)

## 2023-09-11 LAB — COMPREHENSIVE METABOLIC PANEL
ALT: 23 U/L (ref 0–44)
AST: 21 U/L (ref 15–41)
Albumin: 3.8 g/dL (ref 3.5–5.0)
Alkaline Phosphatase: 81 U/L (ref 38–126)
Anion gap: 9 (ref 5–15)
BUN: 16 mg/dL (ref 6–20)
CO2: 23 mmol/L (ref 22–32)
Calcium: 9 mg/dL (ref 8.9–10.3)
Chloride: 106 mmol/L (ref 98–111)
Creatinine, Ser: 1.02 mg/dL (ref 0.61–1.24)
GFR, Estimated: >60 mL/min (ref >60)
Glucose, Bld: 113 mg/dL — ABNORMAL HIGH (ref 70–99)
Potassium: 3.6 mmol/L (ref 3.5–5.1)
Sodium: 138 mmol/L (ref 135–145)
Total Bilirubin: 0.8 mg/dL (ref <1.2)
Total Protein: 7.1 g/dL (ref 6.5–8.1)

## 2023-09-11 LAB — RESP PANEL BY RT-PCR (RSV, FLU A&B, COVID)  RVPGX2
Influenza A by PCR: NEGATIVE
Influenza B by PCR: NEGATIVE
Resp Syncytial Virus by PCR: NEGATIVE
SARS Coronavirus 2 by RT PCR: NEGATIVE

## 2023-09-11 LAB — BLOOD GAS, VENOUS
Acid-Base Excess: 4.7 mmol/L — ABNORMAL HIGH (ref 0.0–2.0)
Bicarbonate: 29.2 mmol/L — ABNORMAL HIGH (ref 20.0–28.0)
O2 Saturation: 88 %
Patient temperature: 37
pCO2, Ven: 42 mm[Hg] — ABNORMAL LOW (ref 44–60)
pH, Ven: 7.45 — ABNORMAL HIGH (ref 7.25–7.43)
pO2, Ven: 53 mm[Hg] — ABNORMAL HIGH (ref 32–45)

## 2023-09-11 LAB — TROPONIN I (HIGH SENSITIVITY): Troponin I (High Sensitivity): 8 ng/L (ref <18)

## 2023-09-11 LAB — BRAIN NATRIURETIC PEPTIDE: B Natriuretic Peptide: 78.3 pg/mL (ref 0.0–100.0)

## 2023-09-11 LAB — LACTIC ACID, PLASMA: Lactic Acid, Venous: 1.6 mmol/L (ref 0.5–1.9)

## 2023-09-11 LAB — D-DIMER, QUANTITATIVE: D-Dimer, Quant: 0.4 ug{FEU}/mL (ref 0.00–0.50)

## 2023-09-11 MED ORDER — IOHEXOL 350 MG/ML SOLN
100.0000 mL | Freq: Once | INTRAVENOUS | Status: AC | PRN
  Administered 2023-09-11: 100 mL via INTRAVENOUS

## 2023-09-11 MED ORDER — LORAZEPAM 2 MG/ML IJ SOLN
1.0000 mg | Freq: Once | INTRAMUSCULAR | Status: AC
  Administered 2023-09-12: 1 mg via INTRAVENOUS
  Filled 2023-09-11: qty 1

## 2023-09-11 NOTE — ED Notes (Signed)
Patient transported to CT 

## 2023-09-11 NOTE — ED Provider Notes (Signed)
[facility] Provider Note    Event Date/Time   First MD Initiated Contact with Patient 09/11/23 2203     (approximate)   History   Shortness of Breath   HPI  Patrick Short is a 47 y.o. male paroxysmal atrial fibrillation status post ablation, cardiomyopathy (presumed nonischemic with LVEF of 40-45% by TEE at time of atrial fibrillation ablation in 10/2021 and normalization on repeat echo in 05/2022), hypertension, anxiety, depression, and PTSD who comes in with shortness of breath and looks like last time he was in the visit on 08/28/2023 he was in normal sinus.  He is on apixaban.  He has a history of severe reaction to beta-blocker requiring epinephrine.  He has previously been on diltiazem.  She reports having somewhat of a cough as well as some shortness of breath that started today.  He states that he typically does not get short of breath like this.  He reports being told that he has lung cancer a few months ago.  When I asked how a CT scan back in August did not show any signs of lung cancer I asked him how he was diagnosed and he stated that he does not really remember.  He states that he is not undergoing any treatments.  He reports from an anxiety perspective that he has been doing well on Klonopin.  Denies any SI or feel like he is extremely anxious.  He denies any abdominal pain, falls and hitting his head, swelling his legs or other concerns.  He reports being compliant with his medications.    Physical Exam   Triage Vital Signs: ED Triage Vitals  Encounter Vitals Group     BP 09/11/23 2139 (!) 160/112     Systolic BP Percentile --      Diastolic BP Percentile --      Pulse Rate 09/11/23 2139 92     Resp 09/11/23 2139 (!) 28     Temp 09/11/23 2139 97.9 F (36.6 C)     Temp Source 09/11/23 2139 Oral     SpO2 09/11/23 2139 93 %     Weight 09/11/23 2140 168 lb (76.2 kg)     Height 09/11/23 2140 5\' 7"  (1.702 m)     Head Circumference --      Peak  Flow --      Pain Score 09/11/23 2139 6     Pain Loc --      Pain Education --      Exclude from Growth Chart --     Most recent vital signs: Vitals:   09/11/23 2139  BP: (!) 160/112  Pulse: 92  Resp: (!) 28  Temp: 97.9 F (36.6 C)  SpO2: 93%     General: Awake, no distress.  CV:  Good peripheral perfusion.  Resp:  Normal effort.  Abd:  No distention.  Soft and nontender Other:  No swelling in legs.  No calf tenderness   ED Results / Procedures / Treatments   Labs (all labs ordered are listed, but only abnormal results are displayed) Labs Reviewed  CBC WITH DIFFERENTIAL/PLATELET  COMPREHENSIVE METABOLIC PANEL  BRAIN NATRIURETIC PEPTIDE  BLOOD GAS, VENOUS  TROPONIN I (HIGH SENSITIVITY)     EKG  My interpretation of EKG:  Normal sinus rate of 89 without any ST elevations or T wave inversions occasional PACs  RADIOLOGY I have reviewed the xray personally and interpreted and possible pneumonia in the left lung.  PROCEDURES:  Critical Care  performed: No  .1-3 Lead EKG Interpretation  Performed by: Concha Se, MD Authorized by: Concha Se, MD     Interpretation: normal     ECG rate:  90   ECG rate assessment: normal     Rhythm: sinus rhythm     Ectopy: none     Conduction: normal      MEDICATIONS ORDERED IN ED: Medications - No data to display   IMPRESSION / MDM / ASSESSMENT AND PLAN / ED COURSE  I reviewed the triage vital signs and the nursing notes.   Patient's presentation is most consistent with acute presentation with potential threat to life or bodily function.   Patient comes in with shortness of breath and coughing.  Differential includes ACS, CHF, pneumonia.  Seems less likely PE given he is on blood thinner but if we have to image his abdomen we will consider imaging of his chest.  This x-ray is concerning for potential pneumonia  On repeat assessment he does report some abdominal pain and he has an air-fluid level noted in his  x-ray.  Will get CT imaging of his chest abdomen pelvis to evaluate for any pneumonia, PE, abdominal discomfort.  Patient handed off to oncoming team pending CT imaging, blood work.   The patient is on the cardiac monitor to evaluate for evidence of arrhythmia and/or significant heart rate changes.      FINAL CLINICAL IMPRESSION(S) / ED DIAGNOSES   Final diagnoses:  SOB (shortness of breath)     Rx / DC Orders   ED Discharge Orders     None        Note:  This document was prepared using Dragon voice recognition software and may include unintentional dictation errors.   Concha Se, MD 09/11/23 2257

## 2023-09-11 NOTE — ED Notes (Signed)
Rt notified that venous blood gas in lab

## 2023-09-11 NOTE — ED Triage Notes (Addendum)
Pt BIB [facility] coming from home. Pt presents with ShOB that started suddenly 2 hours PTA. EMS report pt's lung sounds had wheezing and they admin albuterol neb treatment. Pt has a hx of stage 2 lung CA for which pt has chosen palliative care for.   163/113 SpO2 98% on R/A HR 90-120 a-fib T 98.8

## 2023-09-12 MED ORDER — GUAIFENESIN-CODEINE 100-10 MG/5ML PO SOLN: 5.0000 mL | Freq: Four times a day (QID) | ORAL | 0 refills | Status: DC | PRN

## 2023-09-12 MED ORDER — AZITHROMYCIN 250 MG PO TABS: 250.0000 mg | ORAL_TABLET | Freq: Every day | ORAL | 0 refills | Status: DC

## 2023-09-12 MED ORDER — PREDNISONE 20 MG PO TABS: 40.0000 mg | ORAL_TABLET | Freq: Every day | ORAL | 0 refills | Status: AC

## 2023-09-12 MED ORDER — AZITHROMYCIN 500 MG PO TABS
500.0000 mg | ORAL_TABLET | Freq: Once | ORAL | Status: AC
  Administered 2023-09-12: 500 mg via ORAL
  Filled 2023-09-12: qty 1

## 2023-09-12 NOTE — ED Provider Notes (Signed)
-----------------------------------------   2:08 AM on 09/12/2023 ----------------------------------------- Patient care assumed from Dr. Fuller Plan.  Patient's workup was overall reassuring.  CBC is normal with a normal white blood cell count chemistry reassuring, troponin negative D-dimer negative lactic acid is 1.6.  COVID/flu/RSV negative.  Patient CTA of the chest shows scattered opacities in lower lungs possibly infectious or inflammatory.  Patient states he has been coughing for several weeks although worse this week per patient.  Patient CT scan to the abdomen shows no acute process.  Patient is very reassured by the workup.  Given the possible infectious versus inflammatory changes in the lung bases we will prescribe azithromycin as well as a short course of prednisone.  Will also provide a cough medication for the patient to be used if needed and have the patient follow-up with his doctor for recheck/reevaluation.  Patient agreeable to this plan.   Minna Antis, MD 09/12/23 [phone]

## 2023-09-12 NOTE — ED Notes (Signed)
Water given and pt reports feeling calmer since getting ativan. Respirations are even and unlabored and no longer tacypnea

## 2023-09-14 ENCOUNTER — Ambulatory Visit: Payer: Medicaid Other | Admitting: Family Medicine

## 2023-09-16 LAB — CULTURE, BLOOD (ROUTINE X 2)
Culture: NO GROWTH
Special Requests: ADEQUATE

## 2023-09-18 ENCOUNTER — Ambulatory Visit: Payer: Self-pay | Admitting: *Deleted

## 2023-09-18 NOTE — Telephone Encounter (Signed)
  Chief Complaint: SOB, chest discomfort, wheezing hx Stage II lung CA Symptoms: see above. SOB with coughing, dizziness lightheadedness. Coughing makes worse. Completed prednisone and antibiotics . No better since leaving hospital  Frequency: 6 days Pertinent Negatives: Patient denies chest pain difficulty breathing no fever Disposition: [] ED /[] Urgent Care (no appt availability in office) / [] Appointment(In office/virtual)/ []  [facility]/ [] Home Care/ [x] Refused Recommended Disposition /[] [facility]/ []  Follow-up with PCP Additional Notes:   Recommended UC/ED does not want to go to ED.  No available appt today . Patient would like to know if he can be seen today or by a provider on Monday. Please advise and patient would like a call back.     Reason for Disposition  [1] MILD difficulty breathing (e.g., minimal/no SOB at rest, SOB with walking, pulse <100) AND [2] NEW-onset or WORSE than normal  Answer Assessment - Initial Assessment Questions 1. RESPIRATORY STATUS: "Describe your breathing?" (e.g., wheezing, shortness of breath, unable to speak, severe coughing)      Shortness of breath , chest pain with coughing , wheezing  2. ONSET: "When did this breathing problem begin?"      Since discharge from hospital 6 days ago  3. PATTERN "Does the difficult breathing come and go, or has it been constant since it started?"      With coughing  4. SEVERITY: "How bad is your breathing?" (e.g., mild, moderate, severe)    - MILD: No SOB at rest, mild SOB with walking, speaks normally in sentences, can lie down, no retractions, pulse < 100.    - MODERATE: SOB at rest, SOB with minimal exertion and prefers to sit, cannot lie down flat, speaks in phrases, mild retractions, audible wheezing, pulse 100-120.    - SEVERE: Very SOB at rest, speaks in single words, struggling to breathe, sitting hunched forward, retractions, pulse > 120      Shortness of breath with dizziness,  coughing   5. RECURRENT SYMPTOM: "Have you had difficulty breathing before?" If Yes, ask: "When was the last time?" and "What happened that time?"      Yes in hospital  6. CARDIAC HISTORY: "Do you have any history of heart disease?" (e.g., heart attack, angina, bypass surgery, angioplasty)      Na  7. LUNG HISTORY: "Do you have any history of lung disease?"  (e.g., pulmonary embolus, asthma, emphysema)     Hx stage II lung CA 8. CAUSE: "What do you think is causing the breathing problem?"      Took prednisone and antibiotics and not better 9. OTHER SYMPTOMS: "Do you have any other symptoms? (e.g., dizziness, runny nose, cough, chest pain, fever)     Cough SOB dizziness 10. O2 SATURATION MONITOR:  "Do you use an oxygen saturation monitor (pulse oximeter) at home?" If Yes, ask: "What is your reading (oxygen level) today?" "What is your usual oxygen saturation reading?" (e.g., 95%)       na 11. PREGNANCY: "Is there any chance you are pregnant?" "When was your last menstrual period?"       na 12. TRAVEL: "Have you traveled out of the country in the last month?" (e.g., travel history, exposures)       na  Protocols used: Breathing Difficulty-A-AH

## 2023-09-18 NOTE — Telephone Encounter (Signed)
Recommend going to urgent care.

## 2023-09-21 NOTE — Telephone Encounter (Signed)
I spoke to patient. His symptoms still persist. I offered an appointment this afternoon with Dr. Roxan Hockey, but he will not be able to make it in time. He declined being scheduled with another provider tomorrow, or going to UC/ED. Dr. Danella Penton first available after today is 09/25/23 at 2:40 pm. Patient accepted this appointment date and time. I urged him to seek UC, ED or call the office during office hours with worsening symptoms. He verbalized understanding and agreement with plan.

## 2023-09-21 NOTE — Telephone Encounter (Signed)
Ok to override virtual appt slot in my clinic today if patient able to make it to the clinic

## 2023-09-22 ENCOUNTER — Other Ambulatory Visit: Payer: Self-pay | Admitting: Family Medicine

## 2023-09-22 ENCOUNTER — Other Ambulatory Visit: Payer: Self-pay

## 2023-09-22 DIAGNOSIS — F419 Anxiety disorder, unspecified: Secondary | ICD-10-CM
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
## 2023-09-22 NOTE — Patient Instructions (Signed)
Visit Information  Patrick Short was given information about Medicaid Managed Care team care coordination services as a part of their [facility] Community Plan Medicaid benefit. Patrick Short verbally consented to engagement with the [facility] Managed Care team.   If you are experiencing a medical emergency, please call 911 or report to your local emergency department or urgent care.   If you have a non-emergency medical problem during routine business hours, please contact your provider's office and ask to speak with a nurse.   For questions related to your [facility], please call: [phone] or visit the homepage here: [email or web address]  If you would like to schedule transportation through your [facility], please call the following number at least 2 days in advance of your appointment: [phone]   Rides for urgent appointments can also be made after hours by calling Member Services.  Call the Behavioral Health Crisis Line at [phone]) [phone], at any time, 24 hours a day, 7 days a week. If you are in danger or need immediate medical attention call 911.  If you would like help to quit smoking, call 1-800-QUIT-NOW (620-376-6169) OR Espaol: 1-855-Djelo-Ya (6-387-564-3329) o para ms informacin haga clic aqu or Text READY to 518-841 to register via text  Patrick Short - following are the goals we discussed in your visit today:   Goals Addressed   None      Social Worker will follow up on 10/23/23.   Patrick Short, Patrick Short, MHA [facility]  Managed Medicaid Social Worker [phone]   Following is a copy of your plan of care:  There are no care plans that you recently modified to display for this patient.

## 2023-09-25 ENCOUNTER — Ambulatory Visit (INDEPENDENT_AMBULATORY_CARE_PROVIDER_SITE_OTHER): Payer: Medicaid Other | Admitting: Family Medicine

## 2023-09-25 VITALS — BP 146/94 | HR 67 | Resp 16 | Wt 170.4 lb

## 2023-09-25 DIAGNOSIS — J40 Bronchitis, not specified as acute or chronic: Secondary | ICD-10-CM

## 2023-09-25 MED ORDER — BENZONATATE 100 MG PO CAPS: 200.0000 mg | ORAL_CAPSULE | Freq: Two times a day (BID) | ORAL | 0 refills | Status: DC | PRN

## 2023-09-25 MED ORDER — PREDNISONE 20 MG PO TABS: 40.0000 mg | ORAL_TABLET | Freq: Every day | ORAL | 0 refills | Status: AC

## 2023-09-25 MED ORDER — HYDROCOD POLI-CHLORPHE POLI ER 10-8 MG/5ML PO SUER: 5.0000 mL | Freq: Two times a day (BID) | ORAL | 0 refills | Status: DC | PRN
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
## 2023-10-05 ENCOUNTER — Encounter: Payer: Self-pay | Admitting: Pulmonary Disease

## 2023-10-05 ENCOUNTER — Ambulatory Visit: Payer: Medicaid Other | Admitting: Pulmonary Disease

## 2023-10-05 VITALS — BP 150/96 | HR 70 | Temp 97.1°F | Ht 67.0 in | Wt 169.2 lb

## 2023-10-05 DIAGNOSIS — F17210 Nicotine dependence, cigarettes, uncomplicated: Secondary | ICD-10-CM

## 2023-10-05 DIAGNOSIS — I4819 Other persistent atrial fibrillation: Secondary | ICD-10-CM | POA: Diagnosis not present

## 2023-10-05 DIAGNOSIS — J449 Chronic obstructive pulmonary disease, unspecified: Secondary | ICD-10-CM

## 2023-10-05 DIAGNOSIS — R0602 Shortness of breath: Secondary | ICD-10-CM | POA: Diagnosis not present

## 2023-10-05 DIAGNOSIS — I255 Ischemic cardiomyopathy: Secondary | ICD-10-CM

## 2023-10-05 LAB — NITRIC OXIDE: Nitric Oxide: 8

## 2023-10-05 MED ORDER — STIOLTO RESPIMAT 2.5-2.5 MCG/ACT IN AERS: 2.0000 | INHALATION_SPRAY | Freq: Every day | RESPIRATORY_TRACT | 0 refills | Status: DC

## 2023-10-05 NOTE — Patient Instructions (Addendum)
VISIT SUMMARY:  During today's visit, we discussed your ongoing shortness of breath and coughing, which have been persistent for the past eight to nine months. 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 We have outlined a plan to address these issues and will follow up with you in the coming weeks.  YOUR PLAN:  -CHRONIC OBSTRUCTIVE PULMONARY DISEASE (COPD): COPD is a chronic lung disease that makes it hard to breathe. We will start you on a new inhaler to see if it helps with your symptoms.  This will be an inhaler called Stiolto, this is 2 puffs once daily.  We provided you with samples.  We have also ordered pulmonary function tests to better understand your lung function. Please use the inhaler as directed and follow up in 6-8 weeks.  -ATRIAL FIBRILLATION (AFIB): AFib is an irregular and often rapid heart rate. We will order an EKG and an echocardiogram to assess your heart function and recommend an earlier follow-up with your cardiologist. Continue taking Eliquis as prescribed.  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 Since you need the RSV vaccine, we recommend contacting [facility] to obtain it.  INSTRUCTIONS:  Please follow up in 6-8 weeks for COPD. We also recommend scheduling an earlier appointment with your cardiologist. Additionally, we have ordered an EKG and an echocardiogram to assess your heart function. Contact [facility]  to get the RSV vaccine.

## 2023-10-05 NOTE — Progress Notes (Unsigned)
Subjective:    Patient ID: Patrick Short, male    DOB: 02/05/1976, 47 y.o.   MRN: [id number]  Patient Care Team: Ronnald Ramp, MD as PCP - General (Family Medicine) End, Cristal Deer, MD as PCP - Cardiology (Cardiology) Regan Lemming, MD as PCP - Electrophysiology (Cardiology) [facility]or Care Management  Chief Complaint  Patient presents with   Consult    ED for SOB on 11/29. SOB. Wheezing. Dry cough. Has had 2 rounds of antibiotics and prednisone.     BACKGROUND:   HPI    Review of Systems A 10 point review of systems was performed and it is as noted above otherwise negative.   Past Medical History:  Diagnosis Date   Allergy    Anxiety    Arthritis    Blood transfusion without reported diagnosis    Cancer (HCC)    Clotting disorder (HCC)    Depression    Dysrhythmia    Hypertension    Mineral deficiency    Neuromuscular disorder (HCC)    Paroxysmal atrial fibrillation (HCC)    PTSD (post-traumatic stress disorder)     Past Surgical History:  Procedure Laterality Date   APPENDECTOMY     ATRIAL FIBRILLATION ABLATION N/A 10/21/2021   Procedure: ATRIAL FIBRILLATION ABLATION;  Surgeon: Lanier Prude, MD;  Location: [facility] INVASIVE CV LAB;  Service: Cardiovascular;  Laterality: N/A;   EYE SURGERY     laceration to pupil   FRACTURE SURGERY Left    Rod- which was replaced due to rejection of original rod   JOINT REPLACEMENT     SPINAL FUSION     SPLENECTOMY, TOTAL     TEE WITHOUT CARDIOVERSION N/A 10/21/2021   Procedure: TRANSESOPHAGEAL ECHOCARDIOGRAM (TEE);  Surgeon: Lanier Prude, MD;  Location: [facility] INVASIVE CV LAB;  Service: Cardiovascular;  Laterality: N/A;    Patient Active Problem List   Diagnosis Date Noted   Pain of left lower extremity 08/28/2023   Moderate persistent asthma without complication 05/22/2023   Hypercoagulable state due to paroxysmal atrial fibrillation (HCC) 10/30/2022   Elevated glucose 09/08/2022    BMI 26.0-26.9,adult 09/08/2022   Cardiomyopathy (HCC) 10/25/2021   Sleep disturbance 09/10/2021   Atrial fibrillation (HCC) 07/02/2021   PTSD (post-traumatic stress disorder) 03/20/2021   Chronic pain 03/20/2021   Elevated LDL cholesterol level 03/20/2021   Restless leg syndrome 01/28/2021   Depression 01/09/2021   Anxiety 01/09/2021   Essential hypertension 01/09/2021   Tobacco abuse 01/08/2021    Family History  Problem Relation Age of Onset   Hypertension Mother    Atrial fibrillation Mother    Hypertension Father    Other Son        Trisomy 21   Heart disease Maternal Grandmother    Hypertension Maternal Grandmother    Hypertension Paternal Grandfather    Coronary artery disease Paternal Grandfather    Heart disease Paternal Grandfather     Social History   Tobacco Use   Smoking status: Every Day    Current packs/day: 0.50    Average packs/day: 0.7 packs/day for 37.5 years (25.0 ttl pk-yrs)    Types: Cigarettes   Smokeless tobacco: Never   Tobacco comments:    0.5 PPD khj 10/05/2023    Started smoking at 47 years old.    Smoked 2 PPD at his heaviest   Substance Use Topics   Alcohol use: Not Currently    Allergies  Allergen Reactions   Aspirin Anaphylaxis and Shortness Of  Breath   Bee Venom Anaphylaxis   Penicillins Rash and Anaphylaxis   Sulfa Antibiotics Anaphylaxis    Current Meds  Medication Sig   albuterol (VENTOLIN HFA) 108 (90 Base) MCG/ACT inhaler TAKE 2 PUFFS BY MOUTH EVERY 6 HOURS AS NEEDED FOR WHEEZE OR SHORTNESS OF BREATH   apixaban (ELIQUIS) 5 MG TABS tablet Take 1 tablet (5 mg total) by mouth 2 (two) times daily.   clonazePAM (KLONOPIN) 0.5 MG tablet TAKE 1 TABLET BY MOUTH 2 TIMES DAILY.   diltiazem (CARDIZEM CD) 240 MG 24 hr capsule Take 1 capsule (240 mg total) by mouth daily.   EPINEPHrine 0.3 mg/0.3 mL IJ SOAJ injection Inject 0.3 mg into the muscle as needed for anaphylaxis.   prazosin (MINIPRESS) 1 MG capsule Take 1 capsule (1 mg  total) by mouth at bedtime.   rOPINIRole (REQUIP) 1 MG tablet TAKE 1 TABLET BY MOUTH AT BEDTIME.   traZODone (DESYREL) 50 MG tablet Take 1-2 tablets (50-100 mg total) by mouth at bedtime as needed. for sleep    Immunization History  Administered Date(s) Administered   Influenza, Seasonal, Injecte, Preservative Fre 07/20/2023   Influenza,inj,Quad PF,6+ Mos 09/10/2021, 08/20/2022   Pneumococcal Polysaccharide-23 09/10/2021   Td 01/09/2019        Objective:     BP (!) 150/96 (BP Location: Right Arm, Cuff Size: Normal)   Pulse 70   Temp (!) 97.1 F (36.2 C)   Ht 5\' 7"  (1.702 m)   Wt 169 lb 3.2 oz (76.7 kg)   SpO2 96%   BMI 26.50 kg/m   SpO2: 96 % O2 Device: None (Room air)  GENERAL: HEAD: Normocephalic, atraumatic.  EYES: Pupils equal, round, reactive to light.  No scleral icterus.  MOUTH:  NECK: Supple. No thyromegaly. Trachea midline. No JVD.  No adenopathy. PULMONARY: Good air entry bilaterally.  No adventitious sounds. CARDIOVASCULAR: S1 and S2. Regular rate and rhythm.  ABDOMEN: MUSCULOSKELETAL: No joint deformity, no clubbing, no edema.  NEUROLOGIC:  SKIN: Intact,warm,dry. PSYCH:        Assessment & Plan:     ICD-10-CM   1. SOB (shortness of breath)  R06.02 Nitric oxide    EKG 12-Lead    2. Persistent atrial fibrillation (HCC)  I48.19     3. Ischemic cardiomyopathy  I25.5     4. Tobacco dependence due to cigarettes  F17.210       Orders Placed This Encounter  Procedures   Nitric oxide   EKG 12-Lead    No orders of the defined types were placed in this encounter.    Advised if symptoms do not improve or worsen, to please contact office for sooner follow up or seek emergency care.    I spent xxx minutes of dedicated to the care of this patient on the date of this encounter to include pre-visit review of records, face-to-face time with the patient discussing conditions above, post visit ordering of testing, clinical documentation with the  electronic health record, making appropriate referrals as documented, and communicating necessary findings to members of the patients care team.   C. Danice Goltz, MD Advanced Bronchoscopy PCCM [facility]    *This note was dictated using voice recognition software/Dragon.  Despite best efforts to proofread, errors can occur which can change the meaning. Any transcriptional errors that result from this process are unintentional and may not be fully corrected at the time of dictation.

## 2023-10-15 ENCOUNTER — Telehealth: Payer: Self-pay

## 2023-10-15 ENCOUNTER — Other Ambulatory Visit: Payer: Self-pay | Admitting: Pulmonary Disease

## 2023-10-15 MED ORDER — STIOLTO RESPIMAT 2.5-2.5 MCG/ACT IN AERS: 2.0000 | INHALATION_SPRAY | Freq: Every day | RESPIRATORY_TRACT | 11 refills | Status: DC

## 2023-10-15 MED ORDER — STIOLTO RESPIMAT 2.5-2.5 MCG/ACT IN AERS: 2.0000 | INHALATION_SPRAY | Freq: Every day | RESPIRATORY_TRACT | 3 refills | Status: DC

## 2023-10-15 NOTE — Progress Notes (Signed)
 Patient notes benefit of Stiolto 2 puffs daily for his COPD, will proceed to send it to the pharmacy.  Gailen Shelter, MD Advanced Bronchoscopy PCCM  [facility]

## 2023-10-15 NOTE — Telephone Encounter (Signed)
 Per Patient Schedule message-  Patrick Short Jt  P [facility] Admin Pool (supporting Mychart, Generic)2 hours ago (12:23 PM)   Seems to be helping     Patrick Short Jt  P [facility] Admin Pool (supporting Mychart, Generic)2 hours ago (12:22 PM)   Can you call prescription for inhaler to pharmacy please.

## 2023-10-15 NOTE — Telephone Encounter (Addendum)
 I have sent in the prescription. Message sent to the patient.   Nothing further needed.

## 2023-10-23 ENCOUNTER — Other Ambulatory Visit: Payer: Self-pay

## 2023-10-23 NOTE — Patient Instructions (Signed)
  Medicaid Managed Care   Unsuccessful Outreach Note  10/23/2023 Name: Patrick Short MRN: [id number] DOB: 1975-10-30  Referred by: Sharma Coyer, MD Reason for referral : High Risk Managed Medicaid (MM social work unsuccessful telephone outreach )   An unsuccessful telephone outreach was attempted today. The patient was referred to the case management team for assistance with care management and care coordination.   Follow Up Plan: A HIPAA compliant phone message was left for the patient providing contact information and requesting a return call.   Thersia Delene ROBINS, MHA [facility]  Managed [facility] Social Worker [phone]

## 2023-10-23 NOTE — Patient Outreach (Signed)
  Medicaid Managed Care   Unsuccessful Outreach Note  10/23/2023 Name: UZZIEL RUSSEY MRN: [id number] DOB: 1975-10-30  Referred by: Sharma Coyer, MD Reason for referral : High Risk Managed Medicaid (MM social work unsuccessful telephone outreach )   An unsuccessful telephone outreach was attempted today. The patient was referred to the case management team for assistance with care management and care coordination.   Follow Up Plan: A HIPAA compliant phone message was left for the patient providing contact information and requesting a return call.   Thersia Delene ROBINS, MHA [facility]  Managed [facility] Social Worker [phone]

## 2023-11-03 NOTE — Progress Notes (Unsigned)
Established patient visit   Patient: Patrick Short   DOB: December 05, 1975   48 y.o. Male  MRN: [id number] Visit Date: 11/04/2023  Today's healthcare provider: Ronnald Ramp, MD   No chief complaint on file.  Subjective     HPI   Pt stated--requesting increase klonopin Request refill--trazodone Last edited by Shelly Bombard, CMA on 11/04/2023  3:28 PM.       Discussed the use of AI scribe software for clinical note transcription with the patient, who gave verbal consent to proceed.  History of Present Illness   The patient, a 48 year old individual with a history of anxiety and PTSD, presents for a follow-up visit. He reports a recent increase in anger and frustration, which he describes as going from "zero to a thousand in point zero seconds." He notes that his mood can shift rapidly from happiness to anger.  The patient is currently on a regimen of Trazodone, Minipress, and Klonopin. He takes two 50mg  tablets of Trazodone at night and has found that Minipress has significantly improved his symptoms. However, he reports that the current dosage of Klonopin does not last long enough to manage his symptoms effectively. He takes it twice a day, once in the morning and once at night, approximately twelve hours apart.  In addition to his psychiatric medications, the patient is also on a blood thinner, Eliquis, which he also takes twice a day. He has recently been scheduled for an echocardiogram by his pulmonologist and has appointments with cardiology and pulmonology in the near future.  The patient has recently started therapy through a [employer] service organization called [facility]. He has had a positive experience with this service so far, although he has recently had to adjust to a new therapist due to his previous therapist's resignation.  Despite these interventions, the patient continues to struggle with his mental health. He describes feeling overwhelmed with emotions, a  significant change from his previous emotional state. He is also dealing with ongoing housing issues, which may be contributing to his stress and anxiety.         Past Medical History:  Diagnosis Date   Allergy    Anxiety    Arthritis    Blood transfusion without reported diagnosis    Cancer (HCC)    Clotting disorder (HCC)    Depression    Dysrhythmia    Hypertension    Mineral deficiency    Neuromuscular disorder (HCC)    Paroxysmal atrial fibrillation (HCC)    PTSD (post-traumatic stress disorder)     Medications: Outpatient Medications Prior to Visit  Medication Sig   apixaban (ELIQUIS) 5 MG TABS tablet Take 1 tablet (5 mg total) by mouth 2 (two) times daily.   diltiazem (CARDIZEM CD) 240 MG 24 hr capsule Take 1 capsule (240 mg total) by mouth daily.   EPINEPHrine 0.3 mg/0.3 mL IJ SOAJ injection Inject 0.3 mg into the muscle as needed for anaphylaxis.   rOPINIRole (REQUIP) 1 MG tablet TAKE 1 TABLET BY MOUTH AT BEDTIME.   Tiotropium Bromide-Olodaterol (STIOLTO RESPIMAT) 2.5-2.5 MCG/ACT AERS Inhale 2 puffs into the lungs daily.   [DISCONTINUED] clonazePAM (KLONOPIN) 0.5 MG tablet TAKE 1 TABLET BY MOUTH 2 TIMES DAILY.   [DISCONTINUED] prazosin (MINIPRESS) 1 MG capsule Take 1 capsule (1 mg total) by mouth at bedtime.   [DISCONTINUED] traZODone (DESYREL) 50 MG tablet Take 1-2 tablets (50-100 mg total) by mouth at bedtime as needed. for sleep   albuterol (PROVENTIL) (2.5 MG/3ML) 0.083%  nebulizer solution Take 3 mLs (2.5 mg total) by nebulization every 6 (six) hours as needed for wheezing or shortness of breath. (Patient not taking: Reported on 11/04/2023)   albuterol (VENTOLIN HFA) 108 (90 Base) MCG/ACT inhaler TAKE 2 PUFFS BY MOUTH EVERY 6 HOURS AS NEEDED FOR WHEEZE OR SHORTNESS OF BREATH (Patient not taking: Reported on 11/04/2023)   [DISCONTINUED] azithromycin (ZITHROMAX Z-PAK) 250 MG tablet Take 1 tablet (250 mg total) by mouth daily. (Patient not taking: Reported on 10/05/2023)    [DISCONTINUED] benzonatate (TESSALON) 100 MG capsule Take 2 capsules (200 mg total) by mouth 2 (two) times daily as needed for cough. (Patient not taking: Reported on 10/05/2023)   [DISCONTINUED] chlorpheniramine-HYDROcodone (TUSSIONEX) 10-8 MG/5ML Take 5 mLs by mouth every 12 (twelve) hours as needed for cough. (Patient not taking: Reported on 10/05/2023)   No facility-administered medications prior to visit.    Review of Systems  Last metabolic panel Lab Results  Component Value Date   GLUCOSE 113 (H) 09/11/2023   NA 138 09/11/2023   K 3.6 09/11/2023   CL 106 09/11/2023   CO2 23 09/11/2023   BUN 16 09/11/2023   CREATININE 1.02 09/11/2023   GFRNONAA >60 09/11/2023   CALCIUM 9.0 09/11/2023   PROT 7.1 09/11/2023   ALBUMIN 3.8 09/11/2023   LABGLOB 2.7 11/04/2022   AGRATIO 1.9 11/04/2022   BILITOT 0.8 09/11/2023   ALKPHOS 81 09/11/2023   AST 21 09/11/2023   ALT 23 09/11/2023   ANIONGAP 9 09/11/2023   Last lipids Lab Results  Component Value Date   CHOL 170 09/08/2022   HDL 44 09/08/2022   LDLCALC 100 (H) 09/08/2022   TRIG 146 09/08/2022   CHOLHDL 3.9 09/08/2022   Last hemoglobin A1c Lab Results  Component Value Date   HGBA1C 5.3 11/04/2022   Last thyroid functions Lab Results  Component Value Date   TSH 1.780 11/04/2022   T4TOTAL 8.7 11/04/2022        Objective    BP (!) 135/98 (BP Location: Right Arm, Patient Position: Sitting, Cuff Size: Normal)   Pulse 66   Ht 5\' 7"  (1.702 m)   Wt 173 lb (78.5 kg)   SpO2 98%   BMI 27.10 kg/m  BP Readings from Last 3 Encounters:  11/04/23 (!) 135/98  10/05/23 (!) 150/96  09/25/23 (!) 146/94   Wt Readings from Last 3 Encounters:  11/04/23 173 lb (78.5 kg)  10/05/23 169 lb 3.2 oz (76.7 kg)  09/25/23 170 lb 6.4 oz (77.3 kg)        Physical Exam Constitutional:      General: He is not in acute distress.    Appearance: Normal appearance. He is not ill-appearing, toxic-appearing or diaphoretic.  Neurological:      Mental Status: He is alert.  Psychiatric:        Attention and Perception: Attention normal.        Mood and Affect: Mood is anxious and depressed. Affect is tearful.        Speech: Speech normal.        Behavior: Behavior normal. Behavior is cooperative.        Thought Content: Thought content does not include suicidal ideation. Thought content does not include suicidal plan.       No results found for any visits on 11/04/23.  Assessment & Plan     Problem List Items Addressed This Visit       Cardiovascular and Mediastinum   Essential hypertension   Elevated blood pressure  likely secondary to emotional stress and frustration. Acknowledges the impact of stress on blood pressure. Chronic  Continue diltiazem 240mg  daily  -has follow up scheduled with cardiology  - Monitor blood pressure regularly - Address stress management in therapy sessions      Relevant Medications   prazosin (MINIPRESS) 1 MG capsule     Other   PTSD (post-traumatic stress disorder) - Primary   Continue minipress and trazodone for sleep  Continue trazodone 100mg  at bedtime and minipress 1mg  daily      Relevant Medications   prazosin (MINIPRESS) 1 MG capsule   traZODone (DESYREL) 50 MG tablet   Anxiety   Chronic anxiety and PTSD with episodes of severe emotional dysregulation. Current medications include trazodone and clonazepam, with clonazepam's duration being insufficient. Minipress has shown improvement. Engaged in therapy through [facility], which is beneficial. Prefers to increase clonazepam to 1 mg twice daily for better symptom control. Discussed that this is the maximum dosage and may require prior authorization. - Increase clonazepam to 1 mg twice daily, no additional increases after this dose  - Refill trazodone 50 mg, 2 tablets at night - continue to follow up with military therapist       Relevant Medications   traZODone (DESYREL) 50 MG tablet   clonazePAM (KLONOPIN) 1 MG tablet        No follow-ups on file.         Ronnald Ramp, MD  [facility] [phone] (phone) [phone] (fax)  [facility]

## 2023-11-04 ENCOUNTER — Encounter: Payer: Self-pay | Admitting: Family Medicine

## 2023-11-04 ENCOUNTER — Ambulatory Visit: Payer: Medicaid Other | Admitting: Family Medicine

## 2023-11-04 VITALS — BP 135/98 | HR 66 | Ht 67.0 in | Wt 173.0 lb

## 2023-11-04 DIAGNOSIS — I10 Essential (primary) hypertension: Secondary | ICD-10-CM

## 2023-11-04 DIAGNOSIS — F419 Anxiety disorder, unspecified: Secondary | ICD-10-CM | POA: Diagnosis not present

## 2023-11-04 DIAGNOSIS — F4310 Post-traumatic stress disorder, unspecified: Secondary | ICD-10-CM | POA: Diagnosis not present

## 2023-11-04 MED ORDER — PRAZOSIN HCL 1 MG PO CAPS
1.0000 mg | ORAL_CAPSULE | Freq: Every day | ORAL | 1 refills | Status: DC
  Filled 2023-11-27: qty 90, 90d supply, fill #0
  Filled 2024-02-23 (×2): qty 30, 30d supply, fill #1
  Filled 2024-03-28: qty 30, 30d supply, fill #2

## 2023-11-04 MED ORDER — CLONAZEPAM 1 MG PO TABS: 1.0000 mg | ORAL_TABLET | Freq: Two times a day (BID) | ORAL | 0 refills | Status: DC

## 2023-11-04 MED ORDER — TRAZODONE HCL 50 MG PO TABS: 100.0000 mg | ORAL_TABLET | Freq: Every evening | ORAL | 1 refills | Status: DC | PRN

## 2023-11-04 NOTE — Assessment & Plan Note (Signed)
Elevated blood pressure likely secondary to emotional stress and frustration. Acknowledges the impact of stress on blood pressure. Chronic  Continue diltiazem 240mg  daily  -has follow up scheduled with cardiology  - Monitor blood pressure regularly - Address stress management in therapy sessions

## 2023-11-04 NOTE — Assessment & Plan Note (Signed)
Continue minipress and trazodone for sleep  Continue trazodone 100mg  at bedtime and minipress 1mg  daily

## 2023-11-04 NOTE — Assessment & Plan Note (Addendum)
Chronic anxiety and PTSD with episodes of severe emotional dysregulation. Current medications include trazodone and clonazepam, with clonazepam's duration being insufficient. Minipress has shown improvement. Engaged in therapy through [facility], which is beneficial. Prefers to increase clonazepam to 1 mg twice daily for better symptom control. Discussed that this is the maximum dosage and may require prior authorization. - Increase clonazepam to 1 mg twice daily, no additional increases after this dose  - Refill trazodone 50 mg, 2 tablets at night - continue to follow up with military therapist

## 2023-11-09 NOTE — Telephone Encounter (Signed)
The echo order was placed for [facility] to scheduled and of course we don't have any PFT appts to schedule yet

## 2023-11-09 NOTE — Telephone Encounter (Signed)
Synetta Fail please advise. The patient needs to be scheduled for PFT and Echo.

## 2023-11-12 ENCOUNTER — Other Ambulatory Visit: Payer: Self-pay

## 2023-11-17 ENCOUNTER — Ambulatory Visit: Payer: Medicaid Other

## 2023-11-19 ENCOUNTER — Ambulatory Visit (HOSPITAL_COMMUNITY): Payer: Medicaid Other | Admitting: Physician Assistant

## 2023-11-23 ENCOUNTER — Encounter (HOSPITAL_COMMUNITY): Payer: Self-pay

## 2023-11-23 ENCOUNTER — Ambulatory Visit (HOSPITAL_COMMUNITY): Payer: Medicaid Other | Admitting: Physician Assistant

## 2023-11-25 ENCOUNTER — Ambulatory Visit: Payer: Medicaid Other | Admitting: Pulmonary Disease

## 2023-11-27 ENCOUNTER — Other Ambulatory Visit: Payer: Self-pay

## 2023-12-01 ENCOUNTER — Telehealth: Payer: Self-pay | Admitting: Pulmonary Disease

## 2023-12-01 ENCOUNTER — Other Ambulatory Visit: Payer: Self-pay

## 2023-12-01 ENCOUNTER — Other Ambulatory Visit: Payer: Self-pay | Admitting: Family Medicine

## 2023-12-01 DIAGNOSIS — F419 Anxiety disorder, unspecified: Secondary | ICD-10-CM

## 2023-12-01 MED ORDER — STIOLTO RESPIMAT 2.5-2.5 MCG/ACT IN AERS
2.0000 | INHALATION_SPRAY | Freq: Every day | RESPIRATORY_TRACT | 3 refills | Status: DC
  Filled 2023-12-01: qty 4, 30d supply, fill #0
  Filled 2024-02-23: qty 4, 30d supply, fill #1
  Filled 2024-04-20 – 2024-05-02 (×2): qty 4, 30d supply, fill #2

## 2023-12-01 NOTE — Patient Instructions (Signed)
Visit Information  Patrick Short was given information about Medicaid Managed Care team care coordination services as a part of their [facility] Community Plan Medicaid benefit. Patrick Short verbally consented to engagement with the [facility] Managed Care team.   If you are experiencing a medical emergency, please call 911 or report to your local emergency department or urgent care.   If you have a non-emergency medical problem during routine business hours, please contact your provider's office and ask to speak with a nurse.   For questions related to your [facility], please call: [phone] or visit the homepage here: [email or web address]  If you would like to schedule transportation through your [facility], please call the following number at least 2 days in advance of your appointment: [phone]   Rides for urgent appointments can also be made after hours by calling Member Services.  Call the Behavioral Health Crisis Line at [phone], at any time, 24 hours a day, 7 days a week. If you are in danger or need immediate medical attention call 911.  If you would like help to quit smoking, call 1-800-QUIT-NOW (604-030-1687) OR Espaol: 1-855-Djelo-Ya (7-564-332-9518) o para ms informacin haga clic aqu or Text READY to 841-660 to register via text  Mr. Fennewald - following are the goals we discussed in your visit today:   Goals Addressed   None      Social Worker will follow up in 30 days.   Patrick Short, Patrick Short, MHA [facility]  Managed Medicaid Social Worker [phone]   Following is a copy of your plan of care:  There are no care plans that you recently modified to display for this patient.

## 2023-12-01 NOTE — Telephone Encounter (Signed)
Medication Refill -  Most Recent Primary Care Visit:  Provider: Ronnald Ramp  Department: [facility]  Visit Type: OFFICE VISIT  Date: 11/04/2023  Medication: prazosin (MINIPRESS) 1 MG capsule - Refilled today 12/01/2023. rOPINIRole (REQUIP) 1 MG tablet traZODone (DESYREL) 50 MG tablet clonazePAM (KLONOPIN) 1 MG tablet - Pt requesting refill - only has enough for about 4 days.  Pt requesting prescriptions be transferred over to pharmacy below.   Has the patient contacted their pharmacy? Yes Contact office.  Is this the correct pharmacy for this prescription? Yes This is the patient's preferred pharmacy:  [facility] [address] [place] [address] Phone: [phone] Fax: [phone]   Has the prescription been filled recently? Yes  Is the patient out of the medication? No  Has the patient been seen for an appointment in the last year OR does the patient have an upcoming appointment? Yes  Can we respond through MyChart? Yes  Agent: Please be advised that Rx refills may take up to 3 business days. We ask that you follow-up with your pharmacy.

## 2023-12-01 NOTE — Patient Outreach (Signed)
  Medicaid Managed Care Social Work Note  12/01/2023 Name:  Patrick Short MRN:  [id number] DOB:  1975-11-29  Patrick Short is an 48 y.o. year old male who is a primary patient of Simmons-Robinson, Tawanna Cooler, MD.  The Medicaid Managed Care Coordination team was consulted for assistance with:  Community Resources   Patrick Short was given information about Medicaid Managed Care Coordination team services today. Patrick Short Patient agreed to services and verbal consent obtained.  Engaged with patient  for by telephone forfollow up visit in response to referral for case management and/or care coordination services.   Patient is participating in a Managed Medicaid Plan:  Yes  Assessments/Interventions:  Review of past medical history, allergies, medications, health status, including review of consultants reports, laboratory and other test data, was performed as part of comprehensive evaluation and provision of chronic care management services.  SDOH: (Social Drivers of Health) assessments and interventions performed: SDOH Interventions    Flowsheet Row Office Visit from 09/25/2023 in [facility] Office Visit from 07/20/2023 in [facility] Office Visit from 03/24/2023 in [facility] Office Visit from 02/03/2023 in [facility] Office Visit from 01/28/2023 in [facility] Office Visit from 12/05/2022 in [facility]  SDOH Interventions        Depression Interventions/Treatment  Medication Medication, Counseling Medication, Currently on Treatment Medication, Currently on Treatment Medication, Currently on Treatment Medication, Currently on Treatment     BSW completed a telephone outreach with patient, he states he is still living in the same place, there a matter of time that they have to leave. Patient states his income was stopped due to some legal reasons that he  has to get worked out. BSW will continue to follow up with patient and scheduled with LCSW.   Advanced Directives Status:  Not addressed in this encounter.  Care Plan                 Allergies  Allergen Reactions   Aspirin Anaphylaxis and Shortness Of Breath   Bee Venom Anaphylaxis   Penicillins Rash and Anaphylaxis   Sulfa Antibiotics Anaphylaxis    Medications Reviewed Today   Medications were not reviewed in this encounter     Patient Active Problem List   Diagnosis Date Noted   Pain of left lower extremity 08/28/2023   Moderate persistent asthma without complication 05/22/2023   Hypercoagulable state due to paroxysmal atrial fibrillation (HCC) 10/30/2022   Elevated glucose 09/08/2022   BMI 26.0-26.9,adult 09/08/2022   Cardiomyopathy (HCC) 10/25/2021   Sleep disturbance 09/10/2021   Atrial fibrillation (HCC) 07/02/2021   PTSD (post-traumatic stress disorder) 03/20/2021   Chronic pain 03/20/2021   Elevated LDL cholesterol level 03/20/2021   Restless leg syndrome 01/28/2021   Depression 01/09/2021   Anxiety 01/09/2021   Essential hypertension 01/09/2021   Tobacco abuse 01/08/2021    Conditions to be addressed/monitored per PCP order:   housing and mental health  There are no care plans that you recently modified to display for this patient.   Follow up:  Patient agrees to Care Plan and Follow-up.  Plan: The Managed Medicaid care management team will reach out to the patient again over the next 30 days.  Date/time of next scheduled Social Work care management/care coordination outreach:  12/29/23  Gus Puma, Kenard Gower, [facility]  Managed [facility] Social Worker [phone]

## 2023-12-01 NOTE — Telephone Encounter (Signed)
Stiolto refilled sent to [facility]. Nothing further needed.

## 2023-12-01 NOTE — Telephone Encounter (Signed)
A refill is not due for prazosin and trazodone, both sent on 11/04/23 #90/1 refill to [facility].

## 2023-12-01 NOTE — Telephone Encounter (Signed)
Pt is asking to have new rx's sent for his meds to the [facility] community pharmacy. Also asking for Stiolto refills

## 2023-12-02 ENCOUNTER — Other Ambulatory Visit: Payer: Self-pay

## 2023-12-02 ENCOUNTER — Ambulatory Visit: Payer: Medicaid Other

## 2023-12-02 MED ORDER — ROPINIROLE HCL 1 MG PO TABS
1.0000 mg | ORAL_TABLET | Freq: Every day | ORAL | 0 refills | Status: DC
  Filled 2023-12-02: qty 90, 90d supply, fill #0

## 2023-12-02 NOTE — Telephone Encounter (Signed)
Requested Prescriptions  Pending Prescriptions Disp Refills   rOPINIRole (REQUIP) 1 MG tablet 90 tablet 0    Sig: Take 1 tablet (1 mg total) by mouth at bedtime.     Neurology:  Parkinsonian Agents Failed - 12/02/2023 12:41 PM      Failed - Last BP in normal range    BP Readings from Last 1 Encounters:  11/04/23 (!) 135/98         Passed - Last Heart Rate in normal range    Pulse Readings from Last 1 Encounters:  11/04/23 66         Passed - Valid encounter within last 12 months    Recent Outpatient Visits           4 weeks ago PTSD (post-traumatic stress disorder)   [place] [facility] Simmons-Robinson, [place], MD   2 months ago Bronchitis   [place] [facility] [place], [place], MD   2 months ago Anxiety   [place] [facility] [place], [place], MD   3 months ago Anxiety   [place] [facility] [place], [place], MD   4 months ago Anxiety   [place] [facility] Ronnald Ramp, MD       Future Appointments             In 1 week Salena Saner, MD [place] [facility] [place]   In 1 month Simmons-Robinson, [place], MD [facility]             clonazePAM (KLONOPIN) 1 MG tablet 60 tablet 0    Sig: Take 1 tablet (1 mg total) by mouth 2 (two) times daily.     Not Delegated - Psychiatry: Anxiolytics/Hypnotics 2 Failed - 12/02/2023 12:41 PM      Failed - This refill cannot be delegated      Failed - Urine Drug Screen completed in last 360 days      Passed - Patient is not pregnant      Passed - Valid encounter within last 6 months    Recent Outpatient Visits           4 weeks ago PTSD (post-traumatic stress disorder)   [place] [facility] Simmons-Robinson, [place], MD   2 months ago Bronchitis   [place] [facility] Simmons-Robinson, [place], MD    2 months ago Anxiety   [place] [facility] [place], [place], MD   3 months ago Anxiety   [facility] [place], [place], MD   4 months ago Anxiety   [place] [facility] [place], Tawanna Cooler, MD       Future Appointments             In 1 week Salena Saner, MD [facility] [place]   In 1 month Simmons-Robinson, Tawanna Cooler, MD [facility], [facility]

## 2023-12-02 NOTE — Telephone Encounter (Signed)
Requested medication (s) are due for refill today - yes  Requested medication (s) are on the active medication list -yes  Future visit scheduled -yes  Last refill: 11/04/23 #60  Notes to clinic: non delegated Rx  Requested Prescriptions  Pending Prescriptions Disp Refills   clonazePAM (KLONOPIN) 1 MG tablet 60 tablet 0    Sig: Take 1 tablet (1 mg total) by mouth 2 (two) times daily.     Not Delegated - Psychiatry: Anxiolytics/Hypnotics 2 Failed - 12/02/2023 12:43 PM      Failed - This refill cannot be delegated      Failed - Urine Drug Screen completed in last 360 days      Passed - Patient is not pregnant      Passed - Valid encounter within last 6 months    Recent Outpatient Visits           4 weeks ago PTSD (post-traumatic stress disorder)   [place] [facility] Simmons-Robinson, [place], MD   2 months ago Bronchitis   [place] [facility] [place], [place], MD   2 months ago Anxiety   [place] [facility] [place], [place], MD   3 months ago Anxiety   [place] [place], [place], MD   4 months ago Anxiety   [place] [facility] Ronnald Ramp, MD       Future Appointments             In 1 week Salena Saner, MD [place] [facility] [place]   In 1 month Simmons-Robinson, [place], MD [facility], [facility]            Signed Prescriptions Disp Refills   rOPINIRole (REQUIP) 1 MG tablet 90 tablet 0    Sig: Take 1 tablet (1 mg total) by mouth at bedtime.     Neurology:  Parkinsonian Agents Failed - 12/02/2023 12:43 PM      Failed - Last BP in normal range    BP Readings from Last 1 Encounters:  11/04/23 (!) 135/98         Passed - Last Heart Rate in normal range    Pulse Readings from Last 1 Encounters:  11/04/23 66         Passed - Valid encounter within last 12 months     Recent Outpatient Visits           4 weeks ago PTSD (post-traumatic stress disorder)   [place] [facility] Simmons-Robinson, [place], MD   2 months ago Bronchitis   [place] [facility] [place], [place], MD   2 months ago Anxiety   [place] [facility] [place], [place], MD   3 months ago Anxiety   [place] [facility] [place], [place], MD   4 months ago Anxiety    [facility] Ronnald Ramp, MD       Future Appointments             In 1 week Salena Saner, MD  [place] [facility] [place]   In 1 month Simmons-Robinson, [place], MD [facility], [facility]               Requested Prescriptions  Pending Prescriptions Disp Refills   clonazePAM (KLONOPIN) 1 MG tablet 60 tablet 0    Sig: Take 1 tablet (1 mg total) by mouth 2 (two) times daily.  Not Delegated - Psychiatry: Anxiolytics/Hypnotics 2 Failed - 12/02/2023 12:43 PM      Failed - This refill cannot be delegated      Failed - Urine Drug Screen completed in last 360 days      Passed - Patient is not pregnant      Passed - Valid encounter within last 6 months    Recent Outpatient Visits           4 weeks ago PTSD (post-traumatic stress disorder)   [place] [facility] Simmons-Robinson, [place], MD   2 months ago Bronchitis   [place] [facility] [place], [place], MD   2 months ago Anxiety   [place] [facility] [place], [place], MD   3 months ago Anxiety   [place] [facility] [place], [place], MD   4 months ago Anxiety   [place] [facility] Ronnald Ramp, MD       Future Appointments             In 1 week Salena Saner, MD [place] [facility] [place]   In 1 month  Simmons-Robinson, [place], MD [facility], [facility]            Signed Prescriptions Disp Refills   rOPINIRole (REQUIP) 1 MG tablet 90 tablet 0    Sig: Take 1 tablet (1 mg total) by mouth at bedtime.     Neurology:  Parkinsonian Agents Failed - 12/02/2023 12:43 PM      Failed - Last BP in normal range    BP Readings from Last 1 Encounters:  11/04/23 (!) 135/98         Passed - Last Heart Rate in normal range    Pulse Readings from Last 1 Encounters:  11/04/23 66         Passed - Valid encounter within last 12 months    Recent Outpatient Visits           4 weeks ago PTSD (post-traumatic stress disorder)   [place] [facility] Simmons-Robinson, [place], MD   2 months ago Bronchitis   [place] [facility] [place], [place], MD   2 months ago Anxiety   [place] [facility] [place], [place], MD   3 months ago Anxiety   [place] [facility] [place], [place], MD   4 months ago Anxiety    [facility] [place], Tawanna Cooler, MD       Future Appointments             In 1 week Salena Saner, MD [facility]  Pulmonary Care at [place]   In 1 month Simmons-Robinson, Tawanna Cooler, MD [facility], [place]

## 2023-12-03 ENCOUNTER — Other Ambulatory Visit: Payer: Self-pay

## 2023-12-03 MED ORDER — CLONAZEPAM 1 MG PO TABS
1.0000 mg | ORAL_TABLET | Freq: Two times a day (BID) | ORAL | 0 refills | Status: DC
  Filled 2023-12-03: qty 60, 30d supply, fill #0

## 2023-12-04 ENCOUNTER — Ambulatory Visit: Payer: Medicaid Other | Admitting: Licensed Clinical Social Worker

## 2023-12-07 ENCOUNTER — Other Ambulatory Visit: Payer: Self-pay | Admitting: Licensed Clinical Social Worker

## 2023-12-07 ENCOUNTER — Other Ambulatory Visit: Payer: Self-pay

## 2023-12-07 DIAGNOSIS — F4310 Post-traumatic stress disorder, unspecified: Secondary | ICD-10-CM

## 2023-12-07 DIAGNOSIS — F3289 Other specified depressive episodes: Secondary | ICD-10-CM

## 2023-12-07 DIAGNOSIS — F419 Anxiety disorder, unspecified: Secondary | ICD-10-CM

## 2023-12-07 NOTE — Patient Outreach (Signed)
 Medicaid Managed Care Social Work Note  12/07/2023 Name:  Patrick Short MRN:  [id number] DOB:  1976-06-05  Patrick Short is an 48 y.o. year old male who is a primary patient of Simmons-Robinson, Tawanna Cooler, MD.  The Medicaid Managed Care Coordination team was consulted for assistance with:  Mental Health Counseling and Resources  Patrick Short was given information about Medicaid Managed Care Coordination team services today. Patrick Short Patient agreed to services and verbal consent obtained.  Engaged with patient  for by telephone forinitial visit in response to referral for case management and/or care coordination services.   Patient is participating in a Managed Medicaid Plan:  Yes  Assessments/Interventions:  Review of past medical history, allergies, medications, health status, including review of consultants reports, laboratory and other test data, was performed as part of comprehensive evaluation and provision of chronic care management services.  SDOH: (Social Drivers of Health) assessments and interventions performed: SDOH Interventions    Flowsheet Row Patient Outreach Telephone from 12/07/2023 in [facility] Office Visit from 09/25/2023 in [facility] Office Visit from 07/20/2023 in [facility] Office Visit from 03/24/2023 in [facility] Office Visit from 02/03/2023 in [facility] Office Visit from 01/28/2023 in [facility]  SDOH Interventions        Transportation Interventions Payor Benefit  [Pt has [facility] Transportation number and has used service] -- -- -- -- --  Depression Interventions/Treatment  Medication Medication Medication, Counseling Medication, Currently on Treatment Medication, Currently on Treatment Medication, Currently on Treatment  Stress Interventions Community Resources Provided, Provide Counseling -- -- -- -- --        Advanced Directives Status:  See Care Plan for related entries.  Care Plan                 Allergies  Allergen Reactions   Aspirin Anaphylaxis and Shortness Of Breath   Bee Venom Anaphylaxis   Penicillins Rash and Anaphylaxis   Sulfa Antibiotics Anaphylaxis    Medications Reviewed Today     Reviewed by Gustavus Bryant, LCSW (Social Worker) on 12/07/23 at 1331  Med List Status: <None>   Medication Order Taking? Sig Documenting Provider Last Dose Status Informant  albuterol (PROVENTIL) (2.5 MG/3ML) 0.083% nebulizer solution [id number] No Take 3 mLs (2.5 mg total) by nebulization every 6 (six) hours as needed for wheezing or shortness of breath.  Patient not taking: Reported on 11/04/2023   Simmons-Robinson, Tawanna Cooler, MD Not Taking Active   albuterol (VENTOLIN HFA) 108 (90 Base) MCG/ACT inhaler [id number] No TAKE 2 PUFFS BY MOUTH EVERY 6 HOURS AS NEEDED FOR WHEEZE OR SHORTNESS OF BREATH  Patient not taking: Reported on 11/04/2023   Simmons-Robinson, Tawanna Cooler, MD Not Taking Active   apixaban (ELIQUIS) 5 MG TABS tablet [id number] No Take 1 tablet (5 mg total) by mouth 2 (two) times daily. End, Cristal Deer, MD Taking Active   clonazePAM (KLONOPIN) 1 MG tablet [id number]  Take 1 tablet (1 mg total) by mouth 2 (two) times daily. Simmons-Robinson, Makiera, MD  Active   diltiazem (CARDIZEM CD) 240 MG 24 hr capsule [id number] No Take 1 capsule (240 mg total) by mouth daily. End, Cristal Deer, MD Taking Active   EPINEPHrine 0.3 mg/0.3 mL IJ SOAJ injection [id number] No Inject 0.3 mg into the muscle as needed for anaphylaxis. Larae Grooms, NP Taking Active   prazosin (MINIPRESS) 1 MG capsule [id number]  Take 1 capsule (1  mg total) by mouth at bedtime. Simmons-Robinson, Makiera, MD  Active   rOPINIRole (REQUIP) 1 MG tablet [id number]  Take 1 tablet (1 mg total) by mouth at bedtime. Simmons-Robinson, Makiera, MD  Active   Tiotropium Bromide-Olodaterol (STIOLTO RESPIMAT) 2.5-2.5 MCG/ACT AERS [id number]   Inhale 2 puffs into the lungs daily. Salena Saner, MD  Active   traZODone (DESYREL) 50 MG tablet [id number]  Take 2 tablets (100 mg total) by mouth at bedtime as needed. for sleep Ronnald Ramp, MD  Active   Med List Note Louanna Raw, CPhT 08/14/22 2317): Pt was on eliquis but stopped taking due to side effects, has been in contact with cardiology and was ordered to monitor heart rate instead of taking blood thinner.             Patient Active Problem List   Diagnosis Date Noted   Pain of left lower extremity 08/28/2023   Moderate persistent asthma without complication 05/22/2023   Hypercoagulable state due to paroxysmal atrial fibrillation (HCC) 10/30/2022   Elevated glucose 09/08/2022   BMI 26.0-26.9,adult 09/08/2022   Cardiomyopathy (HCC) 10/25/2021   Sleep disturbance 09/10/2021   Atrial fibrillation (HCC) 07/02/2021   PTSD (post-traumatic stress disorder) 03/20/2021   Chronic pain 03/20/2021   Elevated LDL cholesterol level 03/20/2021   Restless leg syndrome 01/28/2021   Depression 01/09/2021   Anxiety 01/09/2021   Essential hypertension 01/09/2021   Tobacco abuse 01/08/2021    Conditions to be addressed/monitored per PCP order:  Anxiety and Depression  Care Plan : LCSW Plan of Care  Updates made by Gustavus Bryant, LCSW since 12/07/2023 12:00 AM     Problem: Anxiety Identification (Anxiety)      Goal: Anxiety Symptoms Identified   Start Date: 12/07/2023  Note:   Timeframe:  Short-Range Goal Priority:  High Start Date:  12/07/23            Expected End Date:  ongoing                     Follow Up Date--12/21/23 at 3 pm  - keep 90 percent of scheduled appointments -consider counseling or psychiatry -consider bumping up your self-care  -consider creating a stronger support network   Why is this important?             Combating depression/anxiety may take some time.            If you don't feel better right away, don't give up on your  treatment plan.    Current barriers:   Chronic Mental Health needs related to symptoms of depression, stress and anxiety. Patient requires Support, Education, Resources, Referrals, Advocacy, and Care Coordination, in order to meet Unmet Mental Health Needs and to find a therapist and psychiatrist. Mental Health Concerns  Possible Lung Cancer Dx Transportation, Housing and Financial Barriers Patient lacks knowledge of local and available community resources and counseling agencies.   Clinical Goal(s): verbalize understanding of plan for management of Anxiety, PTSD, Depression, and Stress symptoms and demonstrate a reduction in symptoms. Patient will connect with a provider for ongoing mental health treatment, increase coping skills, healthy habits, self-management skills, and stress reduction      Patient will implement clinical interventions discussed today to decrease symptoms of depression and increase knowledge and/or ability of: coping skills.    Clinical Interventions:  Assessed patient's previous and current treatment, coping skills, support system and barriers to care. Patient provided hx  Verbalization of feelings encouraged, motivational interviewing  employed Emotional support provided, positive coping strategies explored. Establishing healthy boundaries emphasized and healthy self-care education provided Patient was educated on available mental health resources within his area that accept Medicaid and offer counseling and psychiatry. Patient was advised to contact the back of his insurance card for assistance with benefits as well. Patient is agreeable to referral to [facility] for counseling and psychiatry. [facility] LCSW made referrals on 12/07/23 and patient is aware that these referrals will be with two separate [facility] practices due to therapist availability.  Patient educated on the difference between therapy and psychiatry per patient request Email sent to patient today with available  mental health resources within his area that accept Medicaid and offer the services that he is interested in. Email included some crisis support resources and [facility]'s walk in clinic hours. Patient will review resources over the next two weeks. Emotional support provided. CBT intervention implemented regarding "being mentally fit" by combating negative thinking and replacing it with uplifting support, hope and positivity. Patient reports that he is on medication for anxiety that his PCP prescribed. Patient reports significant worsening anxiety impacting his ability to function appropriately and carry out daily task. He reports his physical health is also triggered his mental health at this time. Assessed social determinant of health barriers Patient reports having [facility] Medicaid transportation number.  Patient receives strong support from spouse Toniann Fail. LCSW provided education on relaxation techniques such as meditation, deep breathing, massage, grounding exercises or yoga that can activate the body's relaxation response and ease symptoms of stress and anxiety. LCSW ask that when pt is struggling with difficult emotions and racing thoughts that they start this relaxation response process. LCSW provided extensive education on healthy coping skills for anxiety. SW used active and reflective listening, validated patient's feelings/concerns, and provided emotional support. Patient will work on implementing appropriate self-care habits into their daily routine such as: staying positive, writing a gratitude list, drinking water, staying active around the house, taking their medications as prescribed, combating negative thoughts or emotions and staying connected with their family and friends. Positive reinforcement provided for this decision to work on this.   Motivational Interviewing employed Depression screen reviewed  PHQ2/ PHQ9 completed or reviewed  Mindfulness or Relaxation training provided Active  listening / Reflection utilized  Advance Care and HCPOA education provided Emotional Support Provided Problem Solving /Task Center strategies reviewed Provided psychoeducation for mental health needs  Provided brief CBT  Reviewed mental health medications and discussed importance of compliance:  Quality of sleep assessed & Sleep Hygiene techniques promoted  Participation in counseling encouraged  Verbalization of feelings encouraged  Suicidal Ideation/Homicidal Ideation assessed: Patient denies SI/HI  Review resources, discussed options and provided patient information about  Mental Health Resources Inter-disciplinary care team collaboration (see longitudinal plan of care) Urgent referrals placed within the [facility] system. Patient was informed that this [facility] LCSW will be going on short term leave in two weeks and that his next follow up call will be with another [facility] LCSW. Patient expressed understanding.   Patient Goals/Self-Care Activities: Take medications as prescribed   Attend all scheduled provider appointments Call pharmacy for medication refills 3-7 days in advance of running out of medications Perform all self care activities independently  Perform IADL's (shopping, preparing meals, housekeeping, managing finances) independently Call provider office for new concerns or questions Work with the social worker to address care coordination needs and will continue to work with the clinical team to address health care and disease management related  needs call 1-800-273-TALK (toll free, 24 hour hotline) If in a crisis, CALL 988 or go to [facility] [address], [place] [phone]) Utilize healthy coping skills and supportive resources discussed Contact PCP with any questions or concerns Keep 90 percent of counseling appointments Call your insurance provider for more information about your Enhanced Benefits  Check out counseling resources  provided  Incorporate into daily practice - relaxation techniques, deep breathing exercises, and mindfulness meditation strategies. Talk about feelings with friends, family members, spiritual advisor, etc. Contact LCSW directly [phone]), if you have questions, need assistance, or if additional social work needs are identified between now and our next scheduled telephone outreach call. Call 988 for mental health hotline/crisis line if needed (24/7 available) Try techniques to reduce symptoms of anxiety/negative thinking (deep breathing, distraction, positive self talk, etc)  - develop a personal safety plan - exercise at least 2 to 3 times per week - have a plan for how to handle bad days - journal feelings and what helps to feel better or worse - spend time or talk with others at least 2 to 3 times per week - watch for early signs of feeling worse - begin personal counseling - call and visit an old friend - check out volunteer opportunities   Follow Up Plan:  The patient has been provided with contact information for the care management team and has been advised to call with any mental health or health related questions or concerns.  The care management team will reach out to the patient again over the next 30 business  days.   If you are experiencing a Mental Health or Behavioral Health Crisis or need someone to talk to, please call the Suicide and Crisis Lifeline: 988    Patient Goals: Initial goal     Follow up:  Patient agrees to Care Plan and Follow-up.  Plan: The Managed Medicaid care management team will reach out to the patient again over the next 30 days.  Dickie La, BSW, MSW, LCSW Licensed Clinical Social Worker [employer]   [facility] [email or web address] Direct Dial: [phone]

## 2023-12-07 NOTE — Patient Instructions (Signed)
 Visit Information  [name] was given information about Medicaid Managed Care team care coordination services as a part of their [facility] Community Plan Medicaid benefit. Patrick Short verbally consented to engagement with the [facility] Managed Care team.   If you are experiencing a medical emergency, please call 911 or report to your local emergency department or urgent care.   If you have a non-emergency medical problem during routine business hours, please contact your provider's office and ask to speak with a nurse.   For questions related to your [facility], please call: [phone] or visit the homepage here: [email or web address]  If you would like to schedule transportation through your [facility], please call the following number at least 2 days in advance of your appointment: [phone]   Rides for urgent appointments can also be made after hours by calling Member Services.  Call the Behavioral Health Crisis Line at [phone], at any time, 24 hours a day, 7 days a week. If you are in danger or need immediate medical attention call 911.  If you would like help to quit smoking, call 1-800-QUIT-NOW ((718)655-9122) OR Espaol: 1-855-Djelo-Ya (1-324-401-0272) o para ms informacin haga clic aqu or Text READY to 536-644 to register via text  Following is a copy of your plan of care:  Care Plan : LCSW Plan of Care  Updates made by Gustavus Bryant, LCSW since 12/07/2023 12:00 AM     Problem: Anxiety Identification (Anxiety)      Goal: Anxiety Symptoms Identified   Start Date: 12/07/2023  Note:   Timeframe:  Short-Range Goal Priority:  High Start Date:  12/07/23            Expected End Date:  ongoing                     Follow Up Date--12/21/23 at 3 pm  - keep 90 percent of scheduled appointments -consider counseling or psychiatry -consider bumping up your  self-care  -consider creating a stronger support network   Why is this important?             Combating depression/anxiety may take some time.            If you don't feel better right away, don't give up on your treatment plan.    Current barriers:   Chronic Mental Health needs related to symptoms of depression, stress and anxiety. Patient requires Support, Education, Resources, Referrals, Advocacy, and Care Coordination, in order to meet Unmet Mental Health Needs and to find a therapist and psychiatrist. Mental Health Concerns  Possible Lung Cancer Dx Transportation, Housing and Financial Barriers Patient lacks knowledge of local and available community resources and counseling agencies.   Clinical Goal(s): verbalize understanding of plan for management of Anxiety, PTSD, Depression, and Stress symptoms and demonstrate a reduction in symptoms. Patient will connect with a provider for ongoing mental health treatment, increase coping skills, healthy habits, self-management skills, and stress reduction      Patient will implement clinical interventions discussed today to decrease symptoms of depression and increase knowledge and/or ability of: coping skills.   Patient Goals/Self-Care Activities: Take medications as prescribed   Attend all scheduled provider appointments Call pharmacy for medication refills 3-7 days in advance of running out of medications Perform all self care activities independently  Perform IADL's (shopping, preparing meals, housekeeping, managing finances) independently Call provider office for new concerns or questions Work with the social worker to  address care coordination needs and will continue to work with the clinical team to address health care and disease management related needs call 1-800-273-TALK (toll free, 24 hour hotline) If in a crisis, CALL 988 or go to [facility] [address], [place] [phone]) Utilize  healthy coping skills and supportive resources discussed Contact PCP with any questions or concerns Keep 90 percent of counseling appointments Call your insurance provider for more information about your Enhanced Benefits  Check out counseling resources provided  Incorporate into daily practice - relaxation techniques, deep breathing exercises, and mindfulness meditation strategies. Talk about feelings with friends, family members, spiritual advisor, etc. Contact LCSW directly [phone]), if you have questions, need assistance, or if additional social work needs are identified between now and our next scheduled telephone outreach call. Call 988 for mental health hotline/crisis line if needed (24/7 available) Try techniques to reduce symptoms of anxiety/negative thinking (deep breathing, distraction, positive self talk, etc)  - develop a personal safety plan - exercise at least 2 to 3 times per week - have a plan for how to handle bad days - journal feelings and what helps to feel better or worse - spend time or talk with others at least 2 to 3 times per week - watch for early signs of feeling worse - begin personal counseling - call and visit an old friend - check out volunteer opportunities   Follow Up Plan:  The patient has been provided with contact information for the care management team and has been advised to call with any mental health or health related questions or concerns.  The care management team will reach out to the patient again over the next 30 business  days.   If you are experiencing a Mental Health or Behavioral Health Crisis or need someone to talk to, please call the Suicide and Crisis Lifeline: 988    Patient Goals: Initial goal     24- Hour Availability:    [facility]  [address] [place], [place] [phone] Crisis [phone]   [facility] [phone]  [place] [facility]   [phone]    [facility]  [phone] (after hours)   Therapeutic Alternative/Mobile Crisis   [phone]   [place] National Suicide Hotline  818-818-0912 [place] Child[place]) [place] [address]   Call [phone] for mental health emergencies   [facility]  [phone]);  [place] and [employer]  [phone]); [place], [place], [place], [place], [place], [place], [place]    [facility] for [facility] Residents For 24/7 walk-up access to mental health services for [facility] children (4+), adolescents and adults, please visit the [facility] located at [address] in [place], [place].  *[facility] also provides comprehensive outpatient behavioral health services in a variety of locations around the [place].  Connect With [place] [address] [place], [place] [address] HelpLine: 857-438-1631 or 1-706-075-0400  Get Directions  Find Help 24/7 By Phone Call our 24-hour HelpLine at 903-476-2621 or 810-434-1485 for immediate assistance for mental health and substance abuse issues.  Walk-In Help [place]: [facility] (Ages 4 and Up) [place]: Emergency Dept., [facility] Additional Resources National Hopeline Network: 1-800-SUICIDE The National Suicide Prevention Lifeline: 1-800-273-TALK     The following coping skill education was provided for stress relief and mental health management: "When your car dies or a  deadline looms, how do you respond? Long-term, low-grade or acute stress takes a serious toll on your body and mind, so don't ignore feelings of constant tension. Stress is a natural part of life. However, too much stress can harm our health, especially if it continues every day. This is chronic stress and can put you at risk for heart problems like heart disease and depression. Understand what's happening inside your body and  learn simple coping skills to combat the negative impacts of everyday stressors.  Types of Stress There are two types of stress: Emotional - types of emotional stress are relationship problems, pressure at work, financial worries, experiencing discrimination or having a major life change. Physical - Examples of physical stress include being sick having pain, not sleeping well, recovery from an injury or having an alcohol and drug use disorder. Fight or Flight Sudden or ongoing stress activates your nervous system and floods your bloodstream with adrenaline and cortisol, two hormones that raise blood pressure, increase heart rate and spike blood sugar. These changes pitch your body into a fight or flight response. That enabled our ancestors to outrun saber-toothed tigers, and it's helpful today for situations like dodging a car accident. But most modern chronic stressors, such as finances or a challenging relationship, keep your body in that heightened state, which hurts your health. Effects of Too Much Stress If constantly under stress, most of [place] will eventually start to function less well.  Multiple studies link chronic stress to a higher risk of heart disease, stroke, depression, weight gain, memory loss and even premature death, so it's important to recognize the warning signals. Talk to your doctor about ways to manage stress if you're experiencing any of these symptoms: Prolonged periods of poor sleep. Regular, severe headaches. Unexplained weight loss or gain. Feelings of isolation, withdrawal or worthlessness. Constant anger and irritability. Loss of interest in activities. Constant worrying or obsessive thinking. Excessive alcohol or drug use. Inability to concentrate.  10 Ways to Cope with Chronic Stress It's key to recognize stressful situations as they occur because it allows you to focus on managing how you react. We all need to know when to close our eyes and take a deep breath  when we feel tension rising. Use these tips to prevent or reduce chronic stress. 1. Rebalance Work and Home All work and no play? If you're spending too much time at the office, intentionally put more dates in your calendar to enjoy time for fun, either alone or with others. 2. Get Regular Exercise Moving your body on a regular basis balances the nervous system and increases blood circulation, helping to flush out stress hormones. Even a daily 20-minute walk makes a difference. Any kind of exercise can lower stress and improve your mood ? just pick activities that you enjoy and make it a regular habit. 3. Eat Well and Limit Alcohol and Stimulants Alcohol, nicotine and caffeine may temporarily relieve stress but have negative health impacts and can make stress worse in the long run. Well-nourished bodies cope better, so start with a good breakfast, add more organic fruits and vegetables for a well-balanced diet, avoid processed foods and sugar, try herbal tea and drink more water. 4. Connect with Supportive People Talking face to face with another person releases hormones that reduce stress. Lean on those good listeners in your life. 5. Carve Out Hobby Time Do you enjoy gardening, reading, listening to music or some other creative pursuit? Engage in activities that bring you pleasure and  joy; research shows that reduces stress by almost half and lowers your heart rate, too. 6. Practice Meditation, Stress Reduction or Yoga Relaxation techniques activate a state of restfulness that counterbalances your body's fight-or-flight hormones. Even if this also means a 10-minute break in a long day: listen to music, read, go for a walk in nature, do a hobby, take a bath or spend time with a friend. Also consider doing a mindfulness exercise or try a daily deep breathing or imagery practice. Deep Breathing Slow, calm and deep breathing can help you relax. Try these steps to focus on your breathing and repeat as  needed. Find a comfortable position and close your eyes. Exhale and drop your shoulders. Breathe in through your nose; fill your lungs and then your belly. Think of relaxing your body, quieting your mind and becoming calm and peaceful. Breathe out slowly through your nose, relaxing your belly. Think of releasing tension, pain, worries or distress. Repeat steps three and four until you feel relaxed. Imagery This involves using your mind to excite the senses -- sound, vision, smell, taste and feeling. This may help ease your stress. Begin by getting comfortable and then do some slow breathing. Imagine a place you love being at. It could be somewhere from your childhood, somewhere you vacationed or just a place in your imagination. Feel how it is to be in the place you're imagining. Pay attention to the sounds, air, colors, and who is there with you. This is a place where you feel cared for and loved. All is well. You are safe. Take in all the smells, sounds, tastes and feelings. As you do, feel your body being nourished and healed. Feel the calm that surrounds you. Breathe in all the good. Breathe out any discomfort or tension. 7. Sleep Enough If you get less than seven to eight hours of sleep, your body won't tolerate stress as well as it could. If stress keeps you up at night, address the cause, and add extra meditation into your day to make up for the lost z's. Try to get seven to nine hours of sleep each night. Make a regular bedtime schedule. Keep your room dark and cool. Try to avoid computers, TV, cell phones and tablets before bed. 8. Bond with Connections You Enjoy Go out for a coffee with a friend, chat with a neighbor, call a family member, visit with a clergy member, or even hang out with your pet. Clinical studies show that spending even a short time with a companion animal can cut anxiety levels almost in half. 9. Take a Vacation Getting away from it all can reset your stress tolerance  by increasing your mental and emotional outlook, which makes you a happier, more productive person upon return. Leave your cellphone and laptop at home! 10. See a Counselor, Coach or Therapist If negative thoughts overwhelm your ability to make positive changes, it's time to seek professional help. Make an appointment today--your health and life are worth it."  Dickie La, BSW, MSW, LCSW Licensed Clinical Social Worker [employer]   [facility] [email or web address] Direct Dial: [phone]

## 2023-12-08 ENCOUNTER — Other Ambulatory Visit: Payer: Self-pay | Admitting: Family Medicine

## 2023-12-08 ENCOUNTER — Telehealth: Payer: Self-pay | Admitting: Family Medicine

## 2023-12-08 ENCOUNTER — Other Ambulatory Visit: Payer: Self-pay

## 2023-12-08 ENCOUNTER — Other Ambulatory Visit (HOSPITAL_COMMUNITY): Payer: Self-pay

## 2023-12-08 ENCOUNTER — Telehealth: Payer: Self-pay | Admitting: Internal Medicine

## 2023-12-08 ENCOUNTER — Other Ambulatory Visit: Payer: Self-pay | Admitting: Internal Medicine

## 2023-12-08 MED ORDER — DILTIAZEM HCL ER COATED BEADS 240 MG PO CP24
240.0000 mg | ORAL_CAPSULE | Freq: Every day | ORAL | 3 refills | Status: DC
  Filled 2023-12-08: qty 90, 90d supply, fill #0
  Filled 2024-03-17: qty 90, 90d supply, fill #1

## 2023-12-08 MED ORDER — APIXABAN 5 MG PO TABS
5.0000 mg | ORAL_TABLET | Freq: Two times a day (BID) | ORAL | 3 refills | Status: DC
  Filled 2023-12-08: qty 180, 90d supply, fill #0
  Filled 2024-03-17: qty 180, 90d supply, fill #1

## 2023-12-08 NOTE — Telephone Encounter (Signed)
 Copied from CRM [id number]. Topic: General - Other >> Dec 08, 2023 11:57 AM Antwanette L wrote: Reason for CRM: Patient is requesting to sign a DNR. Patient can be contacted by phone at [phone]

## 2023-12-08 NOTE — Telephone Encounter (Signed)
*  STAT* If patient is at the pharmacy, call can be transferred to refill team.   1. Which medications need to be refilled? (please list name of each medication and dose if known)  apixaban (ELIQUIS) 5 MG TABS tablet  diltiazem (CARDIZEM CD) 240 MG 24 hr capsule    2. Would you like to learn more about the convenience, safety, & potential cost savings by using the [facility]?     3. Are you open to using the [facility] (Type [facility].  ).   4. Which pharmacy/location (including street and city if local pharmacy) is medication to be sent to?[place] [facility]    5. Do they need a 30 day or 90 day supply? 90 day NEW PHARMACY

## 2023-12-08 NOTE — Telephone Encounter (Signed)
 Copied from CRM [id number]. Topic: Clinical - Medication Refill >> Dec 08, 2023 11:48 AM Antwanette L wrote: Most Recent Primary Care Visit:  Provider: Ronnald Ramp  Department: [id number]  Visit Type: OFFICE VISIT  Date: 11/04/2023  Medication: apixaban (ELIQUIS) 5 MG TABS tablet  and diltiazem (CARDIZEM CD) 240 MG 24 hr capsule  Has the patient contacted their pharmacy? Yes. Pharmacy told the patient he didn't have any refills.   Is this the correct pharmacy for this prescription? Yes This is the patient's preferred pharmacy:  [facility] [address] [place] [address] Phone: [phone] Fax: [phone]   Has the prescription been filled recently? No  Is the patient out of the medication? Yes. Dr. Cristal Deer End wrote out the prescription and the patient contacted the provider but didn't get a response. Patient said Dr. Neita Garnet could refill the medicine.  Has the patient been seen for an appointment in the last year OR does the patient have an upcoming appointment? Yes  Can we respond through MyChart? Yes and by phone at [phone]  Agent: Please be advised that Rx refills may take up to 3 business days. We ask that you follow-up with your pharmacy.

## 2023-12-09 NOTE — Telephone Encounter (Signed)
 Medications sent to [facility] yesterday (12/08/2023).

## 2023-12-09 NOTE — Telephone Encounter (Signed)
 We can discuss at next visit scheduled for Mar 2025

## 2023-12-10 ENCOUNTER — Encounter (HOSPITAL_COMMUNITY): Payer: Self-pay | Admitting: Physician Assistant

## 2023-12-10 ENCOUNTER — Ambulatory Visit (HOSPITAL_COMMUNITY)
Admission: RE | Admit: 2023-12-10 | Discharge: 2023-12-10 | Disposition: A | Discharge status: Home / Self Care | Payer: Medicaid Other | Source: Ambulatory Visit | Attending: Physician Assistant | Admitting: Physician Assistant

## 2023-12-10 VITALS — BP 132/98 | HR 81 | Ht 67.0 in | Wt 169.4 lb

## 2023-12-10 DIAGNOSIS — Z7901 Long term (current) use of anticoagulants: Secondary | ICD-10-CM | POA: Insufficient documentation

## 2023-12-10 DIAGNOSIS — I480 Paroxysmal atrial fibrillation: Secondary | ICD-10-CM

## 2023-12-10 DIAGNOSIS — I110 Hypertensive heart disease with heart failure: Secondary | ICD-10-CM | POA: Insufficient documentation

## 2023-12-10 DIAGNOSIS — F4310 Post-traumatic stress disorder, unspecified: Secondary | ICD-10-CM | POA: Insufficient documentation

## 2023-12-10 DIAGNOSIS — D6869 Other thrombophilia: Secondary | ICD-10-CM | POA: Diagnosis not present

## 2023-12-10 DIAGNOSIS — Z79899 Other long term (current) drug therapy: Secondary | ICD-10-CM | POA: Insufficient documentation

## 2023-12-10 DIAGNOSIS — I4892 Unspecified atrial flutter: Secondary | ICD-10-CM | POA: Insufficient documentation

## 2023-12-10 DIAGNOSIS — I5022 Chronic systolic (congestive) heart failure: Secondary | ICD-10-CM | POA: Insufficient documentation

## 2023-12-10 NOTE — Telephone Encounter (Signed)
 Patient going to make an appoijtment

## 2023-12-10 NOTE — Progress Notes (Signed)
 Primary Care Physician: Ronnald Ramp, MD Primary Cardiologist: Dr End Primary Electrophysiologist: Dr Elberta Fortis  Referring Physician: Dr Suszanne Conners is a 47 y.o. male with a history of HTN, PTSD, systolic dysfunction, atrial flutter, atrial fibrillation who presents for follow up in the [facility]. Patient is on Eliquis for a CHADS2VASC score of 2. He underwent afib and flutter ablation on 10/21/21. He was later started on flecainide but patient discontinued due to intolerable side effects.   Patient returns for follow up for atrial fibrillation. He reports rare instances of tachypalpitations. He denies any significant bleeding issues on anticoagulation.   Today, he denies symptoms of chest pain, shortness of breath, orthopnea, PND, lower extremity edema, dizziness, presyncope, syncope, snoring, daytime somnolence, bleeding, or neurologic sequela. The patient is tolerating medications without difficulties and is otherwise without complaint today.    Atrial Fibrillation Risk Factors:  he does not have symptoms or diagnosis of sleep apnea. he does not have a history of rheumatic fever.   Atrial Fibrillation Management history:  Previous antiarrhythmic drugs: flecainide  Previous cardioversions: none Previous ablations: 10/21/21 Anticoagulation history: Eliquis   Past Medical History:  Diagnosis Date   Allergy    Anxiety    Arthritis    Blood transfusion without reported diagnosis    Cancer (HCC)    Clotting disorder (HCC)    Depression    Dysrhythmia    Hypertension    Mineral deficiency    Neuromuscular disorder (HCC)    Paroxysmal atrial fibrillation (HCC)    PTSD (post-traumatic stress disorder)     Current Outpatient Medications  Medication Sig Dispense Refill   apixaban (ELIQUIS) 5 MG TABS tablet Take 1 tablet (5 mg total) by mouth 2 (two) times daily. 180 tablet 3   clonazePAM (KLONOPIN) 1 MG tablet Take 1 tablet  (1 mg total) by mouth 2 (two) times daily. 60 tablet 0   diltiazem (CARDIZEM CD) 240 MG 24 hr capsule Take 1 capsule (240 mg total) by mouth daily. 90 capsule 3   EPINEPHrine 0.3 mg/0.3 mL IJ SOAJ injection Inject 0.3 mg into the muscle as needed for anaphylaxis. 1 each 1   prazosin (MINIPRESS) 1 MG capsule Take 1 capsule (1 mg total) by mouth at bedtime. 90 capsule 1   rOPINIRole (REQUIP) 1 MG tablet Take 1 tablet (1 mg total) by mouth at bedtime. 90 tablet 0   Tiotropium Bromide-Olodaterol (STIOLTO RESPIMAT) 2.5-2.5 MCG/ACT AERS Inhale 2 puffs into the lungs daily. 12 g 3   traZODone (DESYREL) 50 MG tablet Take 2 tablets (100 mg total) by mouth at bedtime as needed. for sleep 180 tablet 1   No current facility-administered medications for this encounter.    ROS- All systems are reviewed and negative except as per the HPI above.  Physical Exam: Vitals:   12/10/23 1413  BP: (!) 132/98  Pulse: 81  Weight: 76.8 kg  Height: 5\' 7"  (1.702 m)     GEN: Well nourished, well developed in no acute distress CARDIAC: Regular rate and rhythm, no murmurs, rubs, gallops RESPIRATORY:  Clear to auscultation without rales, wheezing or rhonchi  ABDOMEN: Soft, non-tender, non-distended EXTREMITIES:  No edema; No deformity    Wt Readings from Last 3 Encounters:  12/10/23 76.8 kg  11/04/23 78.5 kg  10/05/23 76.7 kg    EKG today demonstrates  SR, PACs Vent. rate 81 BPM PR interval 160 ms QRS duration 80 ms QT/QTcB 386/448 ms   Echo  05/22/22 demonstrated   1. Left ventricular ejection fraction, by estimation, is 55 to 60%. The  left ventricle has normal function. The left ventricle has no regional  wall motion abnormalities.   2. Right ventricular systolic function is normal. The right ventricular  size is normal.   3. The mitral valve is normal in structure. No evidence of mitral valve  regurgitation.   4. The aortic valve is tricuspid. Aortic valve regurgitation is not  visualized.    Epic records are reviewed at length today  CHA2DS2-VASc Score = 2  The patient's score is based upon: CHF History: 1 HTN History: 1 Diabetes History: 0 Stroke History: 0 Vascular Disease History: 0 Age Score: 0 Gender Score: 0       ASSESSMENT AND PLAN: Paroxysmal Atrial Fibrillation/atrial flutter The patient's CHA2DS2-VASc score is 2, indicating a 2.2% annual risk of stroke.   S/p afib and flutter ablation 10/21/21 Patient did not tolerate flecainide He is in SR with rare tachypalpitations. He is happy with his present rhythm control.  Continue Eliquis 5 mg BID Continue diltiazem 240 mg daily  Secondary Hypercoagulable State (ICD10:  D68.69) The patient is at significant risk for stroke/thromboembolism based upon his CHA2DS2-VASc Score of 2.  Continue Apixaban (Eliquis). No bleeding issues.  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 AF clinic in one year.    [name] PA-C [facility] [address] [place], [place] [address] [phone] 12/10/2023 2:40 PM

## 2023-12-11 ENCOUNTER — Ambulatory Visit
Admission: RE | Admit: 2023-12-11 | Discharge: 2023-12-11 | Disposition: A | Discharge status: Home / Self Care | Payer: Medicaid Other | Source: Ambulatory Visit | Attending: Pulmonary Disease | Admitting: Pulmonary Disease

## 2023-12-11 ENCOUNTER — Ambulatory Visit: Payer: Medicaid Other | Admitting: Pulmonary Disease

## 2023-12-11 ENCOUNTER — Other Ambulatory Visit: Payer: Self-pay

## 2023-12-11 ENCOUNTER — Encounter: Payer: Self-pay | Admitting: Pulmonary Disease

## 2023-12-11 VITALS — BP 122/80 | HR 70 | Temp 97.1°F | Ht 67.0 in | Wt 171.2 lb

## 2023-12-11 DIAGNOSIS — I4819 Other persistent atrial fibrillation: Secondary | ICD-10-CM | POA: Diagnosis not present

## 2023-12-11 DIAGNOSIS — J209 Acute bronchitis, unspecified: Secondary | ICD-10-CM

## 2023-12-11 DIAGNOSIS — R0602 Shortness of breath: Secondary | ICD-10-CM

## 2023-12-11 DIAGNOSIS — R042 Hemoptysis: Secondary | ICD-10-CM

## 2023-12-11 DIAGNOSIS — F17210 Nicotine dependence, cigarettes, uncomplicated: Secondary | ICD-10-CM

## 2023-12-11 DIAGNOSIS — J440 Chronic obstructive pulmonary disease with acute lower respiratory infection: Secondary | ICD-10-CM | POA: Diagnosis present

## 2023-12-11 DIAGNOSIS — I255 Ischemic cardiomyopathy: Secondary | ICD-10-CM

## 2023-12-11 DIAGNOSIS — J449 Chronic obstructive pulmonary disease, unspecified: Secondary | ICD-10-CM

## 2023-12-11 MED ORDER — AZITHROMYCIN 500 MG PO TABS
500.0000 mg | ORAL_TABLET | Freq: Every day | ORAL | 0 refills | Status: AC
  Filled 2023-12-11: qty 3, 3d supply, fill #0

## 2023-12-11 NOTE — Progress Notes (Signed)
 Subjective:    Patient ID: Patrick Short, male    DOB: 1976/07/29, 48 y.o.   MRN: [id number]  Patient Care Team: Ronnald Ramp, MD as PCP - General (Family Medicine) End, Cristal Deer, MD as PCP - Cardiology (Cardiology) Regan Lemming, MD as PCP - Electrophysiology (Cardiology) [facility] Care Management  Chief Complaint  Patient presents with   Follow-up    SOB. Wheezing at night. Cough with green/bloody sputum.    BACKGROUND/INTERVAL: 48 year old current smoker who presents for follow-up on the issues of shortness of breath and productive cough.  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  We initially saw him on 05 October 2019 for at that time started him on Stiolto 2 inhalations daily and as needed albuterol.  He has issues with ischemic cardiomyopathy and atrial fibrillation and follows with cardiology.  HPI Discussed the use of AI scribe software for clinical note transcription with the patient, who gave verbal consent to proceed.  History of Present Illness   Patrick Short is a 48 year old male with presumed COPD who presents for follow-up on his breathing issues.  He experiences variable breathing difficulties, describing 'good days and bad days'. The Stiolto inhaler provides some relief, but its effect is short-lived. He uses a rescue inhaler, Albuterol, a couple of times a day as needed.  He describes his cough as deep, with occasional production of sputum chest change in character from gray to green and occasionally contains specks of blood if he has coughed particularly hard. No significant change in his cough has been noted.  He has not yet completed his scheduled pulmonary function test as it was rescheduled, and he is awaiting a new appointment date. An echocardiogram is scheduled for the 12th of the month.  He has recently returned to work as a Microbiologist, which allows him  to work at his own pace.  He has a history of atrial fibrillation and recently saw his cardiology PA, Mr. Charlean Merl, who advised regular communication between visits. [facility].      Review of Systems A 10 point review of systems was performed and it is as noted above otherwise negative.   Patient Active Problem List   Diagnosis Date Noted   Pain of left lower extremity 08/28/2023   Moderate persistent asthma without complication 05/22/2023   Hypercoagulable state due to paroxysmal atrial fibrillation (HCC) 10/30/2022   Elevated glucose 09/08/2022   BMI 26.0-26.9,adult 09/08/2022   Cardiomyopathy (HCC) 10/25/2021   Sleep disturbance 09/10/2021   Atrial fibrillation (HCC) 07/02/2021   PTSD (post-traumatic stress disorder) 03/20/2021   Chronic pain 03/20/2021   Elevated LDL cholesterol level 03/20/2021   Restless leg syndrome 01/28/2021   Depression 01/09/2021   Anxiety 01/09/2021   Essential hypertension 01/09/2021   Tobacco abuse 01/08/2021    Social History   Tobacco Use   Smoking status: Former    Current packs/day: 0.50    Average packs/day: 0.7 packs/day for 37.5 years (25.0 ttl pk-yrs)    Types: Cigarettes   Smokeless tobacco: Never   Tobacco comments:    6 cigarettes a day-khj 12/11/2023    Started smoking at 48 years old.    Smoked 2 PPD at his heaviest   Substance Use Topics   Alcohol use: Not Currently    Allergies  Allergen Reactions   Aspirin Anaphylaxis and Shortness Of Breath  Bee Venom Anaphylaxis   Penicillins Rash and Anaphylaxis   Sulfa Antibiotics Anaphylaxis    Current Meds  Medication Sig   apixaban (ELIQUIS) 5 MG TABS tablet Take 1 tablet (5 mg total) by mouth 2 (two) times daily.   clonazePAM (KLONOPIN) 1 MG tablet Take 1 tablet (1 mg total) by mouth 2 (two) times daily.   diltiazem (CARDIZEM CD) 240 MG 24 hr capsule Take 1 capsule (240 mg total) by mouth daily.   EPINEPHrine 0.3 mg/0.3  mL IJ SOAJ injection Inject 0.3 mg into the muscle as needed for anaphylaxis.   prazosin (MINIPRESS) 1 MG capsule Take 1 capsule (1 mg total) by mouth at bedtime.   rOPINIRole (REQUIP) 1 MG tablet Take 1 tablet (1 mg total) by mouth at bedtime.   Tiotropium Bromide-Olodaterol (STIOLTO RESPIMAT) 2.5-2.5 MCG/ACT AERS Inhale 2 puffs into the lungs daily.   traZODone (DESYREL) 50 MG tablet Take 2 tablets (100 mg total) by mouth at bedtime as needed. for sleep    Immunization History  Administered Date(s) Administered   Influenza, Seasonal, Injecte, Preservative Fre 07/20/2023   Influenza,inj,Quad PF,6+ Mos 09/10/2021, 08/20/2022   Pneumococcal Polysaccharide-23 09/10/2021   Td 01/09/2019        Objective:     BP 122/80 (BP Location: Right Arm, Cuff Size: Normal)   Pulse 70   SpO2 98%   SpO2: 98 % O2 Device: None (Room air)  GENERAL: Well-developed, well-nourished gentleman, no acute distress, fully ambulatory, no conversational dyspnea. HEAD: Normocephalic, atraumatic.  EYES: Pupils equal, round, reactive to light.  No scleral icterus.  MOUTH: Teeth intact, pharynx clear, oral mucosa moist.  No thrush. NECK: Supple. No thyromegaly. Trachea midline. No JVD.  No adenopathy. PULMONARY: Good air entry bilaterally.  Rhonchi noted, no wheezes. CARDIOVASCULAR: S1 and S2. Regular rate and rhythm. No rubs, murmurs or gallops heard. ABDOMEN: Benign. MUSCULOSKELETAL: No joint deformity, no clubbing, no edema.  NEUROLOGIC: No overt focal deficit, no gait disturbance, speech is fluent. SKIN: Intact,warm,dry. PSYCH: Calm, cooperative.  Chest x-ray obtained today shows changes of COPD, no acute infiltrate, this my independent review, formal interpretation by radiology pending:    Assessment & Plan:     ICD-10-CM   1. Acute bronchitis with COPD (HCC)  J44.0 DG Chest 2 View   J20.9     2. Cough with hemoptysis  R04.2 DG Chest 2 View    3. COPD suggested by initial evaluation (HCC)   J44.9     4. Persistent atrial fibrillation (HCC)  I48.19     5. Ischemic cardiomyopathy  I25.5     6. SOB (shortness of breath)  R06.02       Orders Placed This Encounter  Procedures   DG Chest 2 View    Standing Status:   Future    Number of Occurrences:   1    Expected Date:   12/11/2023    Expiration Date:   12/10/2024    Reason for Exam (SYMPTOM  OR DIAGNOSIS REQUIRED):   Cough with hemoptysis    Preferred imaging location?:   [facility]    Meds ordered this encounter  Medications   azithromycin (ZITHROMAX) 500 MG tablet    Sig: Take 1 tablet (500 mg total) by mouth daily for 3 days.    Dispense:  3 tablet    Refill:  0   Discussion:    Chronic Obstructive Pulmonary Disease (COPD) COPD with intermittent symptoms. Current inhaler (Stiolto) provides short-lived relief. PFT rescheduled. Reports occasional hemoptysis.  Await PFT results before modifying inhaler therapy. Albuterol rescue inhaler use discussed. Plan to prescribe antibiotics for potential infection. - Reschedule PFT - Continue Stiolto - Use Albuterol rescue inhaler as needed, up to twice daily - Order chest x-ray for hemoptysis - Prescribe 3-day course of antibiotics  Acute bronchitis with COPD/minor hemoptysis - Chest x-ray shows only changes of COPD - Azithromycin 500 mg daily x 3 days for bronchitis - Call if hemoptysis fails to resolve/worsens  Atrial Fibrillation (AFib) Under cardiologist care with recent visit. Cardiologist requested regular communication. - Maintain regular communication with cardiologist  Follow-up - Follow-up visit after PFTs are performed.      Advised if symptoms do not improve or worsen, to please contact office for sooner follow up or seek emergency care.    I spent 32 minutes of dedicated to the care of this patient on the date of this encounter to include pre-visit review of records, face-to-face time with the patient discussing conditions above, post visit  ordering of testing, clinical documentation with the electronic health record, making appropriate referrals as documented, and communicating necessary findings to members of the patients care team.     C. Danice Goltz, MD Advanced Bronchoscopy PCCM [facility]    *This note was generated using voice recognition software/Dragon and/or AI transcription program.  Despite best efforts to proofread, errors can occur which can change the meaning. Any transcriptional errors that result from this process are unintentional and may not be fully corrected at the time of dictation.

## 2023-12-11 NOTE — Patient Instructions (Signed)
 VISIT SUMMARY:  Today, we discussed your ongoing breathing issues and your history of atrial fibrillation. We reviewed your current medications and made a plan to address your symptoms and upcoming tests.  YOUR PLAN:  -CHRONIC OBSTRUCTIVE PULMONARY DISEASE (COPD): COPD is a chronic lung condition that makes it hard to breathe. You mentioned that your current inhaler, Stiolto, provides only short-term relief. We will continue with this inhaler and your Albuterol rescue inhaler as needed, up to 3 times daily. We have rescheduled your pulmonary function test (PFT) and will await the results before making any changes to your inhaler therapy. Additionally, we will order a chest x-ray to investigate the occasional blood in your sputum and prescribe a 3-day course of antibiotics to address any potential infection.  -ATRIAL FIBRILLATION (AFIB): AFib is an irregular and often rapid heart rate that can lead to poor blood flow. You are under the care of your cardiologist, Mr. Charlean Merl, who has requested regular communication between visits. Please continue to follow his advice and maintain regular communication with him.  INSTRUCTIONS:  Please reschedule your pulmonary function test (PFT) and complete it as soon as possible. Continue using your Stiolto inhaler and Albuterol rescue inhaler as directed. We have ordered a chest x-ray to investigate the blood in your sputum. Additionally, take the prescribed 3-day course of antibiotics. Follow up with [place] after your PFTs are performed.

## 2023-12-18 ENCOUNTER — Encounter: Payer: Self-pay | Admitting: Pulmonary Disease

## 2023-12-19 ENCOUNTER — Other Ambulatory Visit: Payer: Self-pay | Admitting: Family Medicine

## 2023-12-21 ENCOUNTER — Other Ambulatory Visit: Payer: Self-pay | Admitting: *Deleted

## 2023-12-21 NOTE — Telephone Encounter (Signed)
 Requested Prescriptions  Refused Prescriptions Disp Refills   rOPINIRole (REQUIP) 1 MG tablet [Pharmacy Med Name: ROPINIROLE HCL 1 MG TABLET] 90 tablet 0    Sig: TAKE 1 TABLET BY MOUTH EVERYDAY AT BEDTIME     Neurology:  Parkinsonian Agents Passed - 12/21/2023  1:50 PM      Passed - Last BP in normal range    BP Readings from Last 1 Encounters:  12/11/23 122/80         Passed - Last Heart Rate in normal range    Pulse Readings from Last 1 Encounters:  12/11/23 70         Passed - Valid encounter within last 12 months    Recent Outpatient Visits           1 month ago PTSD (post-traumatic stress disorder)   [place] [facility] Simmons-Robinson, [place], MD   2 months ago Bronchitis   [place] [facility] Simmons-Robinson, [place], MD   3 months ago Anxiety   [facility] [place], [place], MD   3 months ago Anxiety   [place] [facility] Simmons-Robinson, [place], MD   5 months ago Anxiety   [place] [facility] Simmons-Robinson, [place], MD       Future Appointments             In 3 weeks Simmons-Robinson, Tawanna Cooler, MD [facility] [place]   In 1 month Salena Saner, MD [facility] [place] [facility]

## 2023-12-22 NOTE — Patient Outreach (Signed)
 Care Coordination   Follow Up Visit Note   12/22/2023-late entry Name: Patrick Short MRN: [id number] DOB: 1975-12-21  Patrick Short is a 48 y.o. year old male who sees Simmons-Robinson, Tawanna Cooler, MD for primary care. I spoke with  Patrick Short by phone on 12/21/23  What matters to the patients health and wellness today?  Management of anxiety, ongoing mental health follow up.    Goals Addressed             This Visit's Progress    ongoing mental health follow up       Activities and task to complete in order to accomplish goals.   EMOTIONAL / MENTAL HEALTH SUPPORT Keep all upcoming appointment discussed today Continue with compliance of taking medication prescribed by Doctor Self Support options  (continue to utilize coping strategies discussed today to manage anxiety, continue to consider ongoing MH) Please follow up with [facility] to schedule ongoing Mental Health follow up         SDOH assessments and interventions completed:  Yes  SDOH Interventions Today    Flowsheet Row Most Recent Value  SDOH Interventions   Food Insecurity Interventions Intervention Not Indicated  [receives EBT]  Transportation Interventions Payor Benefit, Patient Resources (Friends/Family)  Utilities Interventions Intervention Not Indicated        Care Coordination Interventions:  Yes, provided  Interventions Today    Flowsheet Row Most Recent Value  Chronic Disease   Chronic disease during today's visit Hypertension (HTN)  [anxiety, depression]  General Interventions   General Interventions Discussed/Reviewed General Interventions Discussed, [employer], Communication with  [Pt assessed for community resource/mental health needs- confirms residing in a camper with his spouse. Patient served in the [employer]-recently lost VA benefits-now only covered under medicaid-would like to follow back up with mental health provider]  Communication with --  [follow up with [facility]   Health Neuropsychology-Dr.Rodenbough-pt unable to return for f/u due to mutliple missed appts.]  Exercise Interventions   Exercise Discussed/Reviewed Exercise Discussed  [Walks dogs 3-4x per day for excercise]  Mental Health Interventions   Mental Health Discussed/Reviewed Mental Health Discussed, Anxiety, Depression, Grief and Loss  [Pt acknowledges challenges with anxiety-confirms use of Klonopin-additonal coping strategies discussed-per pt "accepting who I am, use of  isolation" beneficial agreeable to ongoing mental health follow up-would like to return to Dr. Kieth Brightly  Safety Interventions   Safety Discussed/Reviewed Safety Discussed  [denies thoughts of harm to self or others-pt encouraged to contact [phone] in the event of a MH crisis]       Follow up plan: Follow up call scheduled for 01/05/24    Encounter Outcome:  Patient Visit Completed

## 2023-12-22 NOTE — Patient Instructions (Signed)
 Visit Information  Thank you for taking time to visit with me today. Please don't hesitate to contact me if I can be of assistance to you.   Following are the goals we discussed today:   Goals Addressed             This Visit's Progress    ongoing mental health follow up       Activities and task to complete in order to accomplish goals.   EMOTIONAL / MENTAL HEALTH SUPPORT Keep all upcoming appointment discussed today Continue with compliance of taking medication prescribed by Doctor Self Support options  (continue to utilize coping strategies discussed today to manage anxiety, continue to consider ongoing MH) Please follow up with [facility] to schedule ongoing Mental Health follow up         Our next appointment is by telephone on 01/05/24 at 2pm  Please call the care guide team at [phone] if you need to cancel or reschedule your appointment.   If you are experiencing a Mental Health or Behavioral Health Crisis or need someone to talk to, please call the Suicide and Crisis Lifeline: 988   Patient verbalizes understanding of instructions and care plan provided today and agrees to view in MyChart. Active MyChart status and patient understanding of how to access instructions and care plan via MyChart confirmed with patient.     Telephone follow up appointment with care management team member scheduled for: 01/05/24  Verna Czech, LCSW [place]  [facility], [facility] Licensed Clinical Social Worker Care Coordinator  Direct Dial: [phone]

## 2023-12-23 ENCOUNTER — Ambulatory Visit: Payer: Medicaid Other | Attending: Pulmonary Disease

## 2023-12-23 DIAGNOSIS — R0602 Shortness of breath: Secondary | ICD-10-CM

## 2023-12-23 LAB — ECHOCARDIOGRAM COMPLETE
AV Mean grad: 4 mmHg
AV Peak grad: 7.1 mmHg
Ao pk vel: 1.33 m/s
Area-P 1/2: 5.02 cm2
S' Lateral: 4.1 cm

## 2023-12-28 ENCOUNTER — Ambulatory Visit: Payer: Medicaid Other | Admitting: Family Medicine

## 2023-12-28 ENCOUNTER — Encounter: Payer: Self-pay | Admitting: Family Medicine

## 2023-12-28 VITALS — BP 107/80 | HR 69 | Ht 67.0 in | Wt 165.0 lb

## 2023-12-28 DIAGNOSIS — Z7189 Other specified counseling: Secondary | ICD-10-CM

## 2023-12-28 DIAGNOSIS — I10 Essential (primary) hypertension: Secondary | ICD-10-CM | POA: Diagnosis not present

## 2023-12-28 NOTE — Progress Notes (Signed)
 Established patient visit   Patient: Patrick Short   DOB: 12-24-1975   48 y.o. Male  MRN: [id number] Visit Date: 12/28/2023  Today's healthcare provider: Ronnald Ramp, MD   Chief Complaint  Patient presents with   DNR Forms     Discuss DNR from, reason he dont want anyone have to make a decision like that for him, he has already died 3 times. He has just lost his mother in-law 2 weeks ago    Subjective     HPI     DNR Forms     Additional comments: Discuss DNR from, reason he dont want anyone have to make a decision like that for him, he has already died 3 times. He has just lost his mother in-law 2 weeks ago       Last edited by Thedora Hinders, CMA on 12/28/2023  9:42 AM.       Discussed the use of AI scribe software for clinical note transcription with the patient, who gave verbal consent to proceed.  History of Present Illness   Patrick Short is a 48 year old male who presents with a request for a do not resuscitate order.  He wants to have a do not resuscitate (DNR) order on file to make end-of-life decisions himself and avoid burdening his family, particularly his wife. This decision is influenced by his recent experience with his mother-in-law's passing, where his wife had to make difficult decisions regarding her care.  He has a history of needing resuscitation in the past, stating that his heart has stopped three times due to injuries sustained during [employer]. He is not afraid of dying and has been considering a DNR for about three years.  His past medical history includes depression, anxiety, hypertension, PTSD, atrial fibrillation, and lung cancer. He is currently engaged in mental health care and has regular appointments with a neuropsychiatrist, facilitated by his case worker.  Family history reveals that most men in his family have died in their fifties. His grandfather outlived all but one of his nine children. His father passed away at  30 from cancer, and his mother left when his father was diagnosed.  Socially, he is a former Arts development officer and has experienced significant loss, including the recent deaths of his grandparents and mother-in-law. He emphasizes not wanting to place the burden of end-of-life decisions on his wife, having witnessed the emotional toll it took on her with her mother's passing.         Past Medical History:  Diagnosis Date   Allergy    Anxiety    Arthritis    Blood transfusion without reported diagnosis    Cancer (HCC)    Clotting disorder (HCC)    Depression    Dysrhythmia    Hypertension    Mineral deficiency    Neuromuscular disorder (HCC)    Paroxysmal atrial fibrillation (HCC)    PTSD (post-traumatic stress disorder)     Medications: Outpatient Medications Prior to Visit  Medication Sig   apixaban (ELIQUIS) 5 MG TABS tablet Take 1 tablet (5 mg total) by mouth 2 (two) times daily.   clonazePAM (KLONOPIN) 1 MG tablet Take 1 tablet (1 mg total) by mouth 2 (two) times daily.   diltiazem (CARDIZEM CD) 240 MG 24 hr capsule Take 1 capsule (240 mg total) by mouth daily.   EPINEPHrine 0.3 mg/0.3 mL IJ SOAJ injection Inject 0.3 mg into the muscle as needed for anaphylaxis.   prazosin (  MINIPRESS) 1 MG capsule Take 1 capsule (1 mg total) by mouth at bedtime.   rOPINIRole (REQUIP) 1 MG tablet Take 1 tablet (1 mg total) by mouth at bedtime.   Tiotropium Bromide-Olodaterol (STIOLTO RESPIMAT) 2.5-2.5 MCG/ACT AERS Inhale 2 puffs into the lungs daily.   traZODone (DESYREL) 50 MG tablet Take 2 tablets (100 mg total) by mouth at bedtime as needed. for sleep   No facility-administered medications prior to visit.    Review of Systems  Last CBC Lab Results  Component Value Date   WBC 9.0 09/11/2023   HGB 16.7 09/11/2023   HCT 47.4 09/11/2023   MCV 97.9 09/11/2023   MCH 34.5 (H) 09/11/2023   RDW 12.2 09/11/2023   PLT 238 09/11/2023   Last metabolic panel Lab Results  Component Value Date    GLUCOSE 113 (H) 09/11/2023   NA 138 09/11/2023   K 3.6 09/11/2023   CL 106 09/11/2023   CO2 23 09/11/2023   BUN 16 09/11/2023   CREATININE 1.02 09/11/2023   GFRNONAA >60 09/11/2023   CALCIUM 9.0 09/11/2023   PROT 7.1 09/11/2023   ALBUMIN 3.8 09/11/2023   LABGLOB 2.7 11/04/2022   AGRATIO 1.9 11/04/2022   BILITOT 0.8 09/11/2023   ALKPHOS 81 09/11/2023   AST 21 09/11/2023   ALT 23 09/11/2023   ANIONGAP 9 09/11/2023   Last lipids Lab Results  Component Value Date   CHOL 170 09/08/2022   HDL 44 09/08/2022   LDLCALC 100 (H) 09/08/2022   TRIG 146 09/08/2022   CHOLHDL 3.9 09/08/2022   Last hemoglobin A1c Lab Results  Component Value Date   HGBA1C 5.3 11/04/2022   Last thyroid functions Lab Results  Component Value Date   TSH 1.780 11/04/2022   T4TOTAL 8.7 11/04/2022   Last vitamin D Lab Results  Component Value Date   VD25OH 31.3 11/04/2022   Last vitamin B12 and Folate Lab Results  Component Value Date   VITAMINB12 636 11/04/2022        Objective    BP 107/80   Pulse 69   Ht 5\' 7"  (1.702 m)   Wt 165 lb (74.8 kg)   SpO2 100%   BMI 25.84 kg/m   BP Readings from Last 3 Encounters:  12/28/23 107/80  12/11/23 122/80  12/10/23 (!) 132/98   Wt Readings from Last 3 Encounters:  12/28/23 165 lb (74.8 kg)  12/11/23 171 lb 3.2 oz (77.7 kg)  12/10/23 169 lb 6.4 oz (76.8 kg)        Physical Exam Constitutional:      General: He is not in acute distress.    Appearance: Normal appearance. He is not ill-appearing.  Pulmonary:     Effort: Pulmonary effort is normal. No respiratory distress.  Neurological:     Mental Status: He is alert and oriented to person, place, and time.       No results found for any visits on 12/28/23.  Assessment & Plan     Problem List Items Addressed This Visit       Cardiovascular and Mediastinum   Essential hypertension - Primary   Chronic, stable well controlled  Continue diltiazem 240mg  daily  -has follow up  scheduled with cardiology  - Monitor blood pressure regularly      Other Visit Diagnoses       Counseling regarding advance care planning and goals of care               Goals of Care He desires a  Do Not Resuscitate (DNR) order due to past resuscitation experiences, recent loss of his mother-in-law, and a wish to prevent his wife from making end-of-life decisions. He has depression, anxiety, PTSD, hypertension, AFib, and lung cancer. He has contemplated this decision for three years, discussed it with healthcare providers, and is firm in his decision. He understands a DNR means no CPR or mechanical life support if unresponsive and does not want to burden loved ones. He has no current self-harm intentions and is open about his mental health status. - Sign and file the DNR order as requested. - Ensure he understands the implications of the DNR order. - Encourage discussion of his decision with mental health providers during upcoming appointments.  Follow-up Follow-up appointment scheduled for March 31st for a physical examination. - Ensure attendance at the follow-up appointment on March 31st for a physical examination.         No follow-ups on file.         Ronnald Ramp, MD  [facility] [phone] (phone) [phone] (fax)  [facility]

## 2023-12-28 NOTE — Assessment & Plan Note (Signed)
 Chronic, stable well controlled  Continue diltiazem 240mg  daily  -has follow up scheduled with cardiology  - Monitor blood pressure regularly

## 2023-12-29 ENCOUNTER — Ambulatory Visit: Payer: Medicaid Other

## 2023-12-31 ENCOUNTER — Ambulatory Visit: Payer: Self-pay | Admitting: *Deleted

## 2023-12-31 NOTE — Patient Instructions (Signed)
 Visit Information  Thank you for taking time to visit with me today. Please don't hesitate to contact me if I can be of assistance to you.   Following are the goals we discussed today:   Goals Addressed             This Visit's Progress    ongoing mental health follow up       Activities and task to complete in order to accomplish goals.   EMOTIONAL / MENTAL HEALTH SUPPORT Keep all upcoming appointment discussed today Continue with compliance of taking medication prescribed by Doctor Self Support options  (continue to utilize coping strategies discussed today to manage anxiety, continue to follow up with [facility] to schedule initial appointment)        Our next appointment is by telephone on 01/07/24 at 11:30am  Please call the care guide team at [phone] if you need to cancel or reschedule your appointment.   If you are experiencing a Mental Health or Behavioral Health Crisis or need someone to talk to, please call the Suicide and Crisis Lifeline: [phone]   Patient verbalizes understanding of instructions and care plan provided today and agrees to view in MyChart. Active MyChart status and patient understanding of how to access instructions and care plan via MyChart confirmed with patient.     Telephone follow up appointment with care management team member scheduled for: 01/07/24  Verna Czech, LCSW [place]  [facility], [facility] Licensed Clinical Social Worker Care Coordinator  Direct Dial: [phone]

## 2023-12-31 NOTE — Patient Outreach (Signed)
 Care Coordination   Follow Up Visit Note   12/31/2023 Name: Patrick Short MRN: [id number] DOB: 04/05/1976  Patrick Short is a 48 y.o. year old male who sees Simmons-Robinson, Tawanna Cooler, MD for primary care. I spoke with  Patrick Short by phone today.  What matters to the patients health and wellness today?  Patient would like resources for ongoing mental health counseling    Goals Addressed             This Visit's Progress    ongoing mental health follow up       Activities and task to complete in order to accomplish goals.   EMOTIONAL / MENTAL HEALTH SUPPORT Keep all upcoming appointment discussed today Continue with compliance of taking medication prescribed by Doctor Self Support options  (continue to utilize coping strategies discussed today to manage anxiety, continue to follow up with [facility] to schedule initial appointment)        SDOH assessments and interventions completed:  No     Care Coordination Interventions:  Yes, provided  Interventions Today    Flowsheet Row Most Recent Value  Chronic Disease   Chronic disease during today's visit Other  [anxiety, depression]  General Interventions   General Interventions Discussed/Reviewed General Interventions Reviewed  [continues to confirms residing  in a camper-no access to water or sewer, camper cannot be moved due to addition attached -VA pay suspended due to DUI' has not received it in 8 months -confirms having legal representation]  Mental Health Interventions   Mental Health Discussed/Reviewed Mental Health Reviewed, Coping Strategies, Anxiety, Depression, Grief and Loss  [continues to discuss concerns regarding [employer] suspending his pay-manages by "adapting  and overcoming" -agreeable to ongoing MH follow up-collaboration call to [facility] left-CSW/Pt will follow up]  Safety Interventions   Safety Discussed/Reviewed Safety Discussed  [patient agreeable to calling [phone] in the event of a mental  health crisis]       Follow up plan: Follow up call scheduled for 01/07/24    Encounter Outcome:  Patient Visit Completed

## 2024-01-01 ENCOUNTER — Other Ambulatory Visit: Payer: Self-pay | Admitting: Family Medicine

## 2024-01-01 ENCOUNTER — Other Ambulatory Visit: Payer: Self-pay

## 2024-01-01 DIAGNOSIS — F419 Anxiety disorder, unspecified: Secondary | ICD-10-CM

## 2024-01-01 MED ORDER — CLONAZEPAM 1 MG PO TABS
1.0000 mg | ORAL_TABLET | Freq: Two times a day (BID) | ORAL | 0 refills | Status: DC
  Filled 2024-01-01: qty 60, 30d supply, fill #0

## 2024-01-01 NOTE — Patient Instructions (Signed)
 Visit Information  [name] was given information about Medicaid Managed Care team care coordination services as a part of their [facility] Community Plan Medicaid benefit. [name] verbally consented to engagement with the [facility] Managed Care team.   If you are experiencing a medical emergency, please call 911 or report to your local emergency department or urgent care.   If you have a non-emergency medical problem during routine business hours, please contact your provider's office and ask to speak with a nurse.   For questions related to your [facility], please call: [phone] or visit the homepage here: [email or web address]  If you would like to schedule transportation through your [facility], please call the following number at least 2 days in advance of your appointment: [phone]   Rides for urgent appointments can also be made after hours by calling Member Services.  Call the Behavioral Health Crisis Line at [phone], at any time, 24 hours a day, 7 days a week. If you are in danger or need immediate medical attention call 911.  If you would like help to quit smoking, call 1-800-QUIT-NOW ((651)441-4539) OR Espaol: 1-855-Djelo-Ya (1-324-401-0272) o para ms informacin haga clic aqu or Text READY to 536-644 to register via text  Mr. Muzyka - following are the goals we discussed in your visit today:   Goals Addressed   None     The  Patient                                              has been provided with contact information for the Managed Medicaid care management team and has been advised to call with any health related questions or concerns.  Gus Puma, Kenard Gower, MHA [facility]  Managed Medicaid Social Worker [phone]   Following is a copy of your plan of care:  There are no care plans that you recently modified to display for this  patient.

## 2024-01-01 NOTE — Patient Outreach (Signed)
 Medicaid Managed Care Social Work Note  01/01/2024 Name:  Patrick Short MRN:  [id number] DOB:  13-Feb-1976  Patrick Short is an 48 y.o. year old male who is a primary patient of Simmons-Robinson, Tawanna Cooler, MD.  The [facility] Managed Care Coordination team was consulted for assistance with:   follow up   Patrick Short was given information about Medicaid Managed Care Coordination team services today. Patrick Short Patient agreed to services and verbal consent obtained.  Engaged with patient  for by telephone forfollow up visit in response to referral for case management and/or care coordination services.   Patient is participating in a Managed Medicaid Plan:  Yes  Assessments/Interventions:  Review of past medical history, allergies, medications, health status, including review of consultants reports, laboratory and other test data, was performed as part of comprehensive evaluation and provision of chronic care management services.  SDOH: (Social Drivers of Health) assessments and interventions performed: SDOH Interventions    Flowsheet Row Patient Outreach Telephone from 12/21/2023 in [facility] Care Coordination Patient Outreach Telephone from 12/07/2023 in [place] HEALTH POPULATION HEALTH DEPARTMENT Office Visit from 09/25/2023 in [facility] Office Visit from 07/20/2023 in [facility] Office Visit from 03/24/2023 in [facility] Office Visit from 02/03/2023 in [facility]  SDOH Interventions        Food Insecurity Interventions Intervention Not Indicated  [receives EBT] -- -- -- -- --  Catering manager, Patient Resources (Friends/Family) Payor Benefit  [Pt has Education officer, environmental and has used service] -- -- -- --  Utilities Interventions Intervention Not Indicated -- -- -- -- --  Depression Interventions/Treatment  -- Medication Medication Medication,  Counseling Medication, Currently on Treatment Medication, Currently on Treatment  Stress Interventions -- [employer] Provided, Provide Counseling -- -- -- --     BSW completed a telephone outreach with patient, he states he enjoy speaking with LCSW on yesterday. Patient states not much has changed since the last appointment. Patient states his income is still suspended. BSW located a resource for [name] residents with Lung Cancer called the Lung Cancer initiative. BSW and patient agreed for the application to be emailed to address on file. No other resources are needed at this time. BSW provided patient with contact information for any future needs.   Advanced Directives Status:  Not addressed in this encounter.  Care Plan                 Allergies  Allergen Reactions   Aspirin Anaphylaxis and Shortness Of Breath   Bee Venom Anaphylaxis   Penicillins Rash and Anaphylaxis   Sulfa Antibiotics Anaphylaxis    Medications Reviewed Today   Medications were not reviewed in this encounter     Patient Active Problem List   Diagnosis Date Noted   Pain of left lower extremity 08/28/2023   Moderate persistent asthma without complication 05/22/2023   Hypercoagulable state due to paroxysmal atrial fibrillation (HCC) 10/30/2022   Elevated glucose 09/08/2022   BMI 26.0-26.9,adult 09/08/2022   Cardiomyopathy (HCC) 10/25/2021   Sleep disturbance 09/10/2021   Atrial fibrillation (HCC) 07/02/2021   PTSD (post-traumatic stress disorder) 03/20/2021   Chronic pain 03/20/2021   Elevated LDL cholesterol level 03/20/2021   Restless leg syndrome 01/28/2021   Depression 01/09/2021   Anxiety 01/09/2021   Essential hypertension 01/09/2021   Tobacco abuse 01/08/2021    Conditions to be addressed/monitored per PCP order:   Follow  up  There are no care plans that you recently modified to display for this patient.   Follow up:  Patient agrees to Care Plan and Follow-up.  Plan: The  Patient has  been provided with contact information for the Managed Medicaid care management team and has been advised to call with any health related questions or concerns.    Patrick Short, MHA [facility]  Managed [facility] Social Worker [phone]

## 2024-01-05 ENCOUNTER — Encounter: Admitting: *Deleted

## 2024-01-05 ENCOUNTER — Other Ambulatory Visit: Payer: Self-pay

## 2024-01-07 ENCOUNTER — Ambulatory Visit: Payer: Self-pay | Admitting: *Deleted

## 2024-01-07 NOTE — Patient Instructions (Signed)
 Visit Information  Thank you for taking time to visit with me today. Please don't hesitate to contact me if I can be of assistance to you.   Following are the goals we discussed today:   Goals Addressed             This Visit's Progress    ongoing mental health follow up       Activities and task to complete in order to accomplish goals.   EMOTIONAL / MENTAL HEALTH SUPPORT Keep all upcoming appointment discussed today Continue with compliance of taking medication prescribed by Doctor Self Support options  (continue to utilize coping strategies discussed today to manage anxiety, follow up with [facility] scheduled for 01/18/24, consider follow up with the [employer] regarding benefits explanation        Our next appointment is by telephone on 01/21/24 at 2pm  Please call the care guide team at [phone] if you need to cancel or reschedule your appointment.   If you are experiencing a Mental Health or Behavioral Health Crisis or need someone to talk to, please call the Suicide and Crisis Lifeline: 988   Patient verbalizes understanding of instructions and care plan provided today and agrees to view in MyChart. Active MyChart status and patient understanding of how to access instructions and care plan via MyChart confirmed with patient.     Telephone follow up appointment with care management team member scheduled for: 01/21/24  Verna Czech, LCSW [place]  [facility]Based Care Institute, [facility] Licensed Clinical Social Worker Care Coordinator  Direct Dial: [phone]

## 2024-01-07 NOTE — Patient Outreach (Signed)
 Care Coordination   Follow Up Visit Note   01/07/2024 Name: Patrick Short MRN: [id number] DOB: 01/27/1976  Patrick Short is a 48 y.o. year old male who sees Simmons-Robinson, Tawanna Cooler, MD for primary care. I spoke with  Patrick Short by phone today.  What matters to the patients health and wellness today?  Ongoing mental health follow up.    Goals Addressed             This Visit's Progress    ongoing mental health follow up       Activities and task to complete in order to accomplish goals.   EMOTIONAL / MENTAL HEALTH SUPPORT Keep all upcoming appointment discussed today Continue with compliance of taking medication prescribed by Doctor Self Support options  (continue to utilize coping strategies discussed today to manage anxiety, follow up with [facility] scheduled for 01/18/24, consider follow up with the [facility] regarding benefits explanation        SDOH assessments and interventions completed:  No     Care Coordination Interventions:  Yes, provided  Interventions Today    Flowsheet Row Most Recent Value  Chronic Disease   Chronic disease during today's visit Other  [anxiety, depression]  General Interventions   General Interventions Discussed/Reviewed General Interventions Discussed, [employer] continues to discuss concerns related to his VA pay which been suspended, not sure when payments will resume, working with attorney and case worker through the satellite office-recommended he visit the [place] for follow up]  Mental Health Interventions   Mental Health Discussed/Reviewed Mental Health Discussed, Coping Strategies, Anxiety  [Patient confirms follow appt with [facility] on 01/20/24 1:30pm-Positive Reinforcement provided for arranging follow up and identifying useful strategies to cope with anxiety. Encouraged self care by F/U with [facility]  [id number]]       Follow up plan: Follow up call scheduled for  01/21/24    Encounter Outcome:  Patient Visit Completed

## 2024-01-11 ENCOUNTER — Encounter: Payer: Self-pay | Admitting: Family Medicine

## 2024-01-11 ENCOUNTER — Ambulatory Visit: Payer: Self-pay | Admitting: Family Medicine

## 2024-01-11 VITALS — BP 107/63 | HR 63 | Ht 66.0 in | Wt 168.0 lb

## 2024-01-11 DIAGNOSIS — Z131 Encounter for screening for diabetes mellitus: Secondary | ICD-10-CM

## 2024-01-11 DIAGNOSIS — I4819 Other persistent atrial fibrillation: Secondary | ICD-10-CM | POA: Diagnosis not present

## 2024-01-11 DIAGNOSIS — G2581 Restless legs syndrome: Secondary | ICD-10-CM | POA: Diagnosis not present

## 2024-01-11 DIAGNOSIS — Z1211 Encounter for screening for malignant neoplasm of colon: Secondary | ICD-10-CM

## 2024-01-11 DIAGNOSIS — Z23 Encounter for immunization: Secondary | ICD-10-CM

## 2024-01-11 DIAGNOSIS — Z0001 Encounter for general adult medical examination with abnormal findings: Secondary | ICD-10-CM

## 2024-01-11 DIAGNOSIS — Z0000 Encounter for general adult medical examination without abnormal findings: Secondary | ICD-10-CM

## 2024-01-11 DIAGNOSIS — Z13220 Encounter for screening for lipoid disorders: Secondary | ICD-10-CM | POA: Diagnosis not present

## 2024-01-11 DIAGNOSIS — E7800 Pure hypercholesterolemia, unspecified: Secondary | ICD-10-CM | POA: Diagnosis not present

## 2024-01-11 DIAGNOSIS — I10 Essential (primary) hypertension: Secondary | ICD-10-CM | POA: Diagnosis not present
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
## 2024-01-12 ENCOUNTER — Encounter: Payer: Self-pay | Admitting: Family Medicine

## 2024-01-12 LAB — CBC
Hematocrit: 43.9 % (ref 37.5–51.0)
Hemoglobin: 15 g/dL (ref 13.0–17.7)
MCH: 32.9 pg (ref 26.6–33.0)
MCHC: 34.2 g/dL (ref 31.5–35.7)
MCV: 96 fL (ref 79–97)
Platelets: 219 10*3/uL (ref 150–450)
RBC: 4.56 x10E6/uL (ref 4.14–5.80)
RDW: 11.7 % (ref 11.6–15.4)
WBC: 10.4 10*3/uL (ref 3.4–10.8)

## 2024-01-12 LAB — LIPID PANEL
Chol/HDL Ratio: 4.9 ratio (ref 0.0–5.0)
Cholesterol, Total: 185 mg/dL (ref 100–199)
HDL: 38 mg/dL — ABNORMAL LOW (ref >39)
LDL Chol Calc (NIH): 118 mg/dL — ABNORMAL HIGH (ref 0–99)
Triglycerides: 161 mg/dL — ABNORMAL HIGH (ref 0–149)
VLDL Cholesterol Cal: 29 mg/dL (ref 5–40)

## 2024-01-12 LAB — HEMOGLOBIN A1C
Est. average glucose Bld gHb Est-mCnc: 103 mg/dL
Hgb A1c MFr Bld: 5.2 % (ref 4.8–5.6)

## 2024-01-12 LAB — CMP14+EGFR
ALT: 18 IU/L (ref 0–44)
AST: 18 IU/L (ref 0–40)
Albumin: 4.5 g/dL (ref 4.1–5.1)
Alkaline Phosphatase: 85 IU/L (ref 44–121)
BUN/Creatinine Ratio: 10 (ref 9–20)
BUN: 10 mg/dL (ref 6–24)
Bilirubin Total: 0.3 mg/dL (ref 0.0–1.2)
CO2: 21 mmol/L (ref 20–29)
Calcium: 9.2 mg/dL (ref 8.7–10.2)
Chloride: 107 mmol/L — ABNORMAL HIGH (ref 96–106)
Creatinine, Ser: 1.04 mg/dL (ref 0.76–1.27)
Globulin, Total: 2.3 g/dL (ref 1.5–4.5)
Glucose: 85 mg/dL (ref 70–99)
Potassium: 4.2 mmol/L (ref 3.5–5.2)
Sodium: 142 mmol/L (ref 134–144)
Total Protein: 6.8 g/dL (ref 6.0–8.5)
eGFR: 89 mL/min/{1.73_m2} (ref >59)

## 2024-01-20 ENCOUNTER — Ambulatory Visit (INDEPENDENT_AMBULATORY_CARE_PROVIDER_SITE_OTHER): Admitting: Psychiatry

## 2024-01-20 DIAGNOSIS — F4310 Post-traumatic stress disorder, unspecified: Secondary | ICD-10-CM

## 2024-01-20 NOTE — Progress Notes (Signed)
 Patient's and person visit was changed to virtual due to patient stating he has right difficulties.  When patient arrived to virtual appointment shortly after explaining patient's story patient then stated his microphone was not working was having technical issues with the appointment.  Patient was instructed to reschedule for another appointment.  Patient was not seen nor charged for this visit.

## 2024-01-21 ENCOUNTER — Encounter: Payer: Self-pay | Admitting: *Deleted

## 2024-01-26 ENCOUNTER — Encounter: Payer: Self-pay | Admitting: Psychiatry

## 2024-01-26 ENCOUNTER — Other Ambulatory Visit: Payer: Self-pay

## 2024-01-26 ENCOUNTER — Ambulatory Visit (INDEPENDENT_AMBULATORY_CARE_PROVIDER_SITE_OTHER): Admitting: Psychiatry

## 2024-01-26 VITALS — BP 120/82 | HR 81 | Temp 98.9°F | Ht 66.0 in | Wt 163.8 lb

## 2024-01-26 DIAGNOSIS — F411 Generalized anxiety disorder: Secondary | ICD-10-CM

## 2024-01-26 DIAGNOSIS — F4310 Post-traumatic stress disorder, unspecified: Secondary | ICD-10-CM | POA: Diagnosis not present

## 2024-01-26 DIAGNOSIS — F331 Major depressive disorder, recurrent, moderate: Secondary | ICD-10-CM | POA: Diagnosis not present

## 2024-01-26 MED ORDER — ESCITALOPRAM OXALATE 5 MG PO TABS
5.0000 mg | ORAL_TABLET | Freq: Every day | ORAL | 0 refills | Status: DC
  Filled 2024-01-26: qty 30, 30d supply, fill #0

## 2024-01-26 MED ORDER — TRAZODONE HCL 100 MG PO TABS
100.0000 mg | ORAL_TABLET | Freq: Every evening | ORAL | 0 refills | Status: DC | PRN
  Filled 2024-01-26: qty 30, 30d supply, fill #0

## 2024-01-26 NOTE — Progress Notes (Cosign Needed Addendum)
 Psychiatric Initial Adult Assessment   Patient Identification: Patrick Short MRN:  [id number] Date of Evaluation:  01/26/2024 Referral Source: Rennie Plowman, FNP Chief Complaint:   Chief Complaint  Patient presents with   Establish Care   Visit Diagnosis:    ICD-10-CM   1. Generalized anxiety disorder  F41.1 escitalopram (LEXAPRO) 5 MG tablet    2. Depression, major, recurrent, moderate (HCC)  F33.1 escitalopram (LEXAPRO) 5 MG tablet    3. PTSD (post-traumatic stress disorder)  F43.10 traZODone (DESYREL) 100 MG tablet      History of Present Illness: A 48 year old male presents to [facility] for establishing care and medication management referred by [facility] family practice.  Patient reports that he is coming in for medication management as he has experienced PTSD symptoms from the [employer]	[place] after 9 years with significant amount anxiety.  Patient is very talkative and seems to have a flight of ideas if not stopped or redirected.  Patient reports that in 02/12/00 his father passed away and it started of significant amount of depression in his life.  Patient keeps reorienting back into severe anxiety complaint and then reporting that he has not been managed appropriately.  Patient states currently he takes Klonopin 1 mg twice a day as needed for anxiety patient was reminded of clinic practice of not prescribing benzodiazepines in which he is in agreement.  Patient also reports he is taking 100 mg of trazodone at night for sleep and also 1 mg Minipress at night for night terrors as reported by his PCP.  Patient also endorses that since starting work he has been feeling much better about his mood and states that he has a Civil Service fast streamer.  While in office when asked about legal issues patient pulled up his leg and it appears to have all ankle monitor with an explanation of "restraining order".  Based on this assessment interview is recommended for the patient to start on Lexapro 5 mg once a day  for a month and will be followed up in 2 weeks to determine medication effectiveness and possible adjustment.  Patient also to continue to go to milligrams of trazodone for sleep as well as 1 mg of Minipress for night terrors.  Being prescribed by PCP.  Patient is also recommended to participate in weekly therapy with treatment plan discussed with therapist.  Patient reports significant amount of trauma as well as depression with recent loss of his son and would recommend for him to start he was also placed on Christies wait list are trauma therapist at [facility].  Patient denies SI, HI.  Patient denies AVH.  Patient has had past suicidality with a suicide attempt in 02/12/2019 with a firearm being placed in his mouth in which he was recommended advised not to have a firearm at home in which he denies being one in his camper at this time.  Patient will follow up in 2 weeks.   Associated Signs/Symptoms: Depression Symptoms:  anhedonia, anxiety, (Hypo) Manic Symptoms:  Distractibility, Grandiosity, Anxiety Symptoms:  Excessive Worry, Psychotic Symptoms: Negative PTSD Symptoms: Had a traumatic exposure:  02-12-19 states his son was in a summer camp and drowned.  Past Psychiatric History:  PDMP: PDMP he has been reviewed today with notes of prescriptions of clonazepam being filled monthly since October 7, 24.  Previous Psych Hospitalizations: Denies  Outpatient treatment: Currently being managed at [facility] family practice for PCP.  Medications Current: -Klonopin 1 mg twice a day as needed, for anxiety -Trazodone 100 mg  once a day at night, for sleep -Minipress 1 mg once a day at night, for night terrors  Medication Trials: -Cymbalta, side effects stopped by patient -Wellbutrin, side effects stop by patient -BuSpar, poor response stopped by patient patient Suicide & Violence: 2020, suicide attempt with a firearm patient attempted to shoot himself in the head putting the gun in his mouth but the gun  jammed when he attempted. Patient denies any suicidal attempts since  Psychotherapy: Not currently participating in psychotherapy patient was recommended and has taken a referral list and has also been placed on [facility] wait list here at [facility]  Legal: Patient is present with ankle monitor on his foot reporting that he is using it for a restraining order reporting he should not have "been at the wrong place in the wrong time ".  Patient also states that he is suing the [place] at this time for resources.   Previous Psychotropic Medications: Yes   Substance Abuse History in the last 12 months:  No.  Consequences of Substance Abuse: NA  Past Medical History:  Past Medical History:  Diagnosis Date   Allergy    Anxiety    Arthritis    Blood transfusion without reported diagnosis    Cancer (HCC)    Clotting disorder (HCC)    Depression    Dysrhythmia    Hypertension    Mineral deficiency    Neuromuscular disorder (HCC)    Paroxysmal atrial fibrillation (HCC)    PTSD (post-traumatic stress disorder)     Past Surgical History:  Procedure Laterality Date   APPENDECTOMY     ATRIAL FIBRILLATION ABLATION N/A 10/21/2021   Procedure: ATRIAL FIBRILLATION ABLATION;  Surgeon: Lanier Prude, MD;  Location: [facility] INVASIVE CV LAB;  Service: Cardiovascular;  Laterality: N/A;   EYE SURGERY     laceration to pupil   FRACTURE SURGERY Left    Rod- which was replaced due to rejection of original rod   JOINT REPLACEMENT     SPINAL FUSION     SPLENECTOMY, TOTAL     TEE WITHOUT CARDIOVERSION N/A 10/21/2021   Procedure: TRANSESOPHAGEAL ECHOCARDIOGRAM (TEE);  Surgeon: Lanier Prude, MD;  Location: [facility] INVASIVE CV LAB;  Service: Cardiovascular;  Laterality: N/A;    Family Psychiatric History: Endorses biological mother and father with alcohol use problems  Family History:  Family History  Problem Relation Age of Onset   Hypertension Mother    Atrial fibrillation Mother    Hypertension  Father    Other Son        Trisomy 21   Heart disease Maternal Grandmother    Hypertension Maternal Grandmother    Hypertension Paternal Grandfather    Coronary artery disease Paternal Grandfather    Heart disease Paternal Grandfather     Social History:   Social History   Socioeconomic History   Marital status: Married    Spouse name: Not on file   Number of children: 3   Years of education: Not on file   Highest education level: Bachelor's degree (e.g., BA, AB, BS)  Occupational History   Not on file  Tobacco Use   Smoking status: Former    Current packs/day: 0.50    Average packs/day: 0.7 packs/day for 37.5 years (25.0 ttl pk-yrs)    Types: Cigarettes   Smokeless tobacco: Never   Tobacco comments:    6 cigarettes a day-khj 12/11/2023    Started smoking at 48 years old.    Smoked 2 PPD at his  heaviest   Vaping Use   Vaping status: Never Used  Substance and Sexual Activity   Alcohol use: Not Currently   Drug use: Not Currently    Frequency: 7.0 times per week    Types: Marijuana    Comment: on occasion   Sexual activity: Yes  Other Topics Concern   Not on file  Social History Narrative   Not on file   Social Drivers of Health   Financial Resource Strain: Medium Risk (01/11/2024)   Overall Financial Resource Strain (CARDIA)    Difficulty of Paying Living Expenses: Somewhat hard  Food Insecurity: No Food Insecurity (01/11/2024)   Hunger Vital Sign    Worried About Running Out of Food in the Last Year: Never true    Ran Out of Food in the Last Year: Never true  Transportation Needs: No Transportation Needs (01/11/2024)   PRAPARE - Administrator, Civil Service (Medical): No    Lack of Transportation (Non-Medical): No  Recent Concern: Transportation Needs - Unmet Transportation Needs (12/21/2023)   PRAPARE - Transportation    Lack of Transportation (Medical): Yes    Lack of Transportation (Non-Medical): Yes  Physical Activity: Insufficiently Active  (09/25/2023)   Exercise Vital Sign    Days of Exercise per Week: 3 days    Minutes of Exercise per Session: 20 min  Stress: Stress Concern Present (12/07/2023)   [employer] of Occupational Health - Occupational Stress Questionnaire    Feeling of Stress : Rather much  Social Connections: Socially Integrated (09/25/2023)   Social Connection and Isolation Panel [NHANES]    Frequency of Communication with Friends and Family: Three times a week    Frequency of Social Gatherings with Friends and Family: Once a week    Attends Religious Services: More than 4 times per year    Active Member of [employer] or Organizations: Yes    Attends Engineer, structural: More than 4 times per year    Marital Status: Living with partner    Additional Social History: No additional social history  Allergies:   Allergies  Allergen Reactions   Aspirin Anaphylaxis and Shortness Of Breath   Bee Venom Anaphylaxis   Penicillins Rash and Anaphylaxis   Sulfa Antibiotics Anaphylaxis    Metabolic Disorder Labs: Lab Results  Component Value Date   HGBA1C 5.2 01/11/2024   No results found for: "PROLACTIN" Lab Results  Component Value Date   CHOL 185 01/11/2024   TRIG 161 (H) 01/11/2024   HDL 38 (L) 01/11/2024   CHOLHDL 4.9 01/11/2024   LDLCALC 118 (H) 01/11/2024   LDLCALC 100 (H) 09/08/2022   Lab Results  Component Value Date   TSH 1.780 11/04/2022    Therapeutic Level Labs: No results found for: "LITHIUM" No results found for: "CBMZ" No results found for: "VALPROATE"  Current Medications: Current Outpatient Medications  Medication Sig Dispense Refill   apixaban (ELIQUIS) 5 MG TABS tablet Take 1 tablet (5 mg total) by mouth 2 (two) times daily. 180 tablet 3   clonazePAM (KLONOPIN) 1 MG tablet Take 1 tablet (1 mg total) by mouth 2 (two) times daily. 60 tablet 0   diltiazem (CARDIZEM CD) 240 MG 24 hr capsule Take 1 capsule (240 mg total) by mouth daily. 90 capsule 3   EPINEPHrine 0.3  mg/0.3 mL IJ SOAJ injection Inject 0.3 mg into the muscle as needed for anaphylaxis. 1 each 1   escitalopram (LEXAPRO) 5 MG tablet Take 1 tablet (5 mg total) by mouth  daily. 30 tablet 0   prazosin (MINIPRESS) 1 MG capsule Take 1 capsule (1 mg total) by mouth at bedtime. 90 capsule 1   rOPINIRole (REQUIP) 1 MG tablet Take 1 tablet (1 mg total) by mouth at bedtime. 90 tablet 0   Tiotropium Bromide-Olodaterol (STIOLTO RESPIMAT) 2.5-2.5 MCG/ACT AERS Inhale 2 puffs into the lungs daily. 12 g 3   traZODone (DESYREL) 100 MG tablet Take 1 tablet (100 mg total) by mouth at bedtime as needed. for sleep 30 tablet 0   No current facility-administered medications for this visit.    Musculoskeletal: Strength & Muscle Tone: within normal limits Gait & Station: normal Patient leans: N/A  Psychiatric Specialty Exam: Review of Systems  Constitutional: Negative.   HENT: Negative.    Eyes: Negative.   Respiratory: Negative.    Cardiovascular: Negative.   Gastrointestinal: Negative.   Endocrine: Negative.   Genitourinary: Negative.   Musculoskeletal: Negative.   Skin: Negative.   Allergic/Immunologic: Negative.   Neurological: Negative.   Hematological: Negative.     Blood pressure 120/82, pulse 81, temperature 98.9 F (37.2 C), temperature source Temporal, height 5\' 6"  (1.676 m), weight 163 lb 12.8 oz (74.3 kg), SpO2 97%.Body mass index is 26.44 kg/m.  General Appearance: Well Groomed  Eye Contact:  Good  Speech:  Clear and Coherent  Volume:  Normal  Mood:  Depressed  Affect:  Depressed  Thought Process:  Coherent  Orientation:  Full (Time, Place, and Person)  Thought Content:  Logical  Suicidal Thoughts:  No  Homicidal Thoughts:  No  Memory:  Immediate;   Good Recent;   Good Remote;   Good  Judgement:  Good  Insight:  Good  Psychomotor Activity:  Normal  Concentration:  Concentration: Good and Attention Span: Good  Recall:  Good  Fund of Knowledge:Good  Language: Good  Akathisia:   No  Handed:  Right  AIMS (if indicated):    Assets:  Financial Resources/Insurance Housing Vocational/Educational  ADL's:  Intact  Cognition: WNL  Sleep:  Good   Screenings: GAD-7    Flowsheet Row Office Visit from 01/11/2024 in [facility] Office Visit from 11/04/2023 in [facility] Office Visit from 09/25/2023 in [facility] Office Visit from 08/26/2023 in [facility] Office Visit from 07/20/2023 in [facility]  Total GAD-7 Score 6 10 11 21 13       PHQ2-9    Flowsheet Row Office Visit from 01/11/2024 in [facility] Patient Outreach Telephone from 12/07/2023 in [place] Office Visit from 11/04/2023 in [facility] Office Visit from 09/25/2023 in [facility] Office Visit from 08/26/2023 in [facility]  PHQ-2 Total Score 1 3 2 2 4   PHQ-9 Total Score 5 6 5 10 15       Flowsheet Row ED from 09/11/2023 in [facility] at [facility] ED from 05/28/2023 in [facility] Emergency Department at [facility] ED from 01/30/2023 in [facility] Emergency Department at [facility]  C-SSRS RISK CATEGORY No Risk No Risk No Risk       Assessment and Plan:  Assessment - Diagnosis: Generalized anxiety disorder [F41.1]  2. Depression, major, recurrent, moderate (HCC) [F33.1]  3. PTSD (post-traumatic stress disorder) [F43.10]  Differential Diagnosis: Bipolar Disorder  - Progress: Baseline appointment - Risk Factors: Worsening symptoms, suicidal risk  Plan - Medications:  Stop Klonopin Start Lexapro 5 mg once daily, patient educated on jitteriness and nausea vomiting and other GI irritability told to notify clinic should last longer than a week. Continue taking trazodone 100 mg once nightly  before bed, patient informed the sedation and instructed to take before bed. - Psychotherapy: Patient recommended for therapy due to traumatic history as well as PTSD diagnosis patient provided referral therapist list.  I recommended for Kristie's wait lis  Here at [facility]. - Education: Patient educated on Lexapro side effects and adverse reactions.  Patient educated on therapy and effects of CBT therapy with trauma.  - Follow-Up: Follow-up in 2 weeks - Referrals: Referral for weekly therapy and local area list provided - Safety Planning: The patient has been educated, if they should have suicidal thoughts with or without a plan to call 911, or go to the closest emergency department.  Pt verbalized understanding.  Pt denies firearms within the home.  DT patient history attempting suicide attempt with a firearm in the past as advised not to have a firearm within the home.  To have access to firearms.  Pt also agrees to call the clinic should they have worsening symptoms before the next appointment.     Patient/Guardian was advised Release of Information must be obtained prior to any record release in order to collaborate their care with an outside provider. Patient/Guardian was advised if they have not already done so to contact the registration department to sign all necessary forms in order for us  to release information regarding their care.   Consent: Patient/Guardian gives verbal consent for treatment and assignment of benefits for services provided during this visit. Patient/Guardian expressed understanding and agreed to proceed.   This office note has been dictated. This dictation was prepared using Air traffic controller. As a result, errors may occur. When identified, these errors have been corrected. While every attempt is made to correct errors during dictation, errors may still exist.   Arlana Labor, NP 4/15/20253:39 PM

## 2024-01-27 ENCOUNTER — Telehealth: Payer: Self-pay | Admitting: Psychiatry

## 2024-01-27 NOTE — Telephone Encounter (Signed)
 PT left a voicemail asking to cancel his follow up for 02/09/24 to be cancelled. He states that his primary care doctor will take care of medications.

## 2024-02-08 ENCOUNTER — Other Ambulatory Visit: Payer: Self-pay

## 2024-02-08 ENCOUNTER — Other Ambulatory Visit: Payer: Self-pay | Admitting: *Deleted

## 2024-02-08 NOTE — Patient Outreach (Signed)
 Complex Care Management   Visit Note  02/08/2024  Name:  Patrick Short MRN: [id number] DOB: 06-Oct-1976  Situation: Referral received for Complex Care Management related to [facility]/Behavioral Health diagnosis depression and anxiety  I obtained verbal consent from Patient.  Visit completed with patient  on the phone  Background:   Past Medical History:  Diagnosis Date   Allergy    Anxiety    Arthritis    Blood transfusion without reported diagnosis    Cancer (HCC)    Clotting disorder (HCC)    Depression    Dysrhythmia    Hypertension    Mineral deficiency    Neuromuscular disorder (HCC)    Paroxysmal atrial fibrillation (HCC)    PTSD (post-traumatic stress disorder)     Assessment: Patient Reported Symptoms:  Cognitive Cognitive Status: Alert and oriented to person, place, and time, Insightful and able to interpret abstract concepts      Neurological Neurological Review of Symptoms: Weakness (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)    HEENT HEENT Symptoms Reported: Tinnitus HEENT Conditions: Ear problem(s) HEENT Self-Management Outcome: 3 (uncertain) HEENT Comment: patient reports havin tinnitus for over 20 years Ear problem(s)  Cardiovascular Cardiovascular Symptoms Reported: No symptoms reported Does patient have uncontrolled Hypertension?: No Cardiovascular Conditions: Hypertension, High blood cholesterol, Cardiomyopathy Cardiovascular Management Strategies: Medication therapy Cardiovascular Self-Management Outcome: 3 (uncertain)  Respiratory Respiratory Symptoms Reported: No symptoms reported Respiratory Conditions: Seasonal allergies, Asthma Respiratory Self-Management Outcome: 4 (good)  Endocrine Is patient diabetic?: No    Gastrointestinal Gastrointestinal Symptoms Reported: No symptoms reported      Genitourinary Genitourinary Symptoms Reported: No symptoms reported    Integumentary Integumentary Symptoms Reported: No symptoms reported    Musculoskeletal  Musculoskelatal Symptoms Reviewed: No symptoms reported Musculoskeletal Conditions: Rheumatoid arthritis Musculoskeletal Management Strategies: Adequate rest Falls in the past year?: No Number of falls in past year: 1 or less Was there an injury with Fall?: No Fall Risk Category Calculator: 0 Patient Fall Risk Level: Low Fall Risk Patient at Risk for Falls Due to: No Fall Risks  Psychosocial Psychosocial Symptoms Reported: Sadness - if selected complete PHQ 2-9, Anxiety - if selected complete GAD Behavioral Health Conditions: Depression, Anxiety Behavioral Management Strategies: Coping strategies, Counseling, Medication therapy Behavioral Health Comment: [facility] appointment for ongoing mental health counseling scheudled for 02/16/24, completed initial intake with [facility] approx. 2 months ago   Next appt for [place] [facility] scheduled for  02/25/24 Techniques to Cope with Loss/Stress/Change: Counseling, Diversional activities Quality of Family Relationships: supportive Do you feel physically threatened by others?: No      02/08/2024    4:22 PM  Depression screen PHQ 2/9  Decreased Interest 0  Down, Depressed, Hopeless 1  PHQ - 2 Score 1    There were no vitals filed for this visit.  Medications Reviewed Today     Reviewed by Ave Leisure, LCSW (Social Worker) on 02/08/24 at 1621  Med List Status: <None>   Medication Order Taking? Sig Documenting Provider Last Dose Status Informant  apixaban  (ELIQUIS ) 5 MG TABS tablet [id number] Yes Take 1 tablet (5 mg total) by mouth 2 (two) times daily. End, Veryl Gottron, MD Taking Active   diltiazem  (CARDIZEM  CD) 240 MG 24 hr capsule [id number] Yes Take 1 capsule (240 mg total) by mouth daily. End, Veryl Gottron, MD Taking Active   EPINEPHrine  0.3 mg/0.3 mL IJ SOAJ injection [id number] Yes Inject 0.3 mg into the muscle as needed for anaphylaxis. Aileen Alexanders, NP Taking Active   escitalopram  (LEXAPRO ) 5 MG  tablet [id number] No Take 1 tablet  (5 mg total) by mouth daily.  Patient not taking: Reported on 02/08/2024   Arlana Labor, NP Not Taking Active            Med Note (Brisa Auth M   Mon Feb 08, 2024  4:20 PM) Stopped taking it   prazosin  (MINIPRESS ) 1 MG capsule [id number] Yes Take 1 capsule (1 mg total) by mouth at bedtime. Simmons-Robinson, Makiera, MD Taking Active   rOPINIRole  (REQUIP ) 1 MG tablet [id number] Yes Take 1 tablet (1 mg total) by mouth at bedtime. Simmons-Robinson, Makiera, MD Taking Active   Tiotropium Bromide-Olodaterol (STIOLTO RESPIMAT ) 2.5-2.5 MCG/ACT AERS [id number] Yes Inhale 2 puffs into the lungs daily. Marc Senior, MD Taking Active   traZODone  (DESYREL ) 100 MG tablet [id number] Yes Take 1 tablet (100 mg total) by mouth at bedtime as needed. for sleep Saucier, Murry Art, NP Taking Active   Med List Note Malva Search, CPhT 08/14/22 2317): Pt was on eliquis  but stopped taking due to side effects, has been in contact with cardiology and was ordered to monitor heart rate instead of taking blood thinner.             Recommendation:   There are no provider recommendations at this time  Follow Up Plan:   Patient has met all care management goals. Care Management case will be closed. Patient has been provided contact information should new needs arise.   Marquis Down, LCSW [place]  [facility], [facility] Licensed Clinical Social Worker Care Coordinator  Direct Dial: [phone]

## 2024-02-08 NOTE — Patient Instructions (Signed)
 Visit Information  Mr. Lama was given information about Medicaid Managed Care team care coordination services as a part of their [facility] Community Plan Medicaid benefit. Cherrie Cornwall verbally consented to engagement with the [facility] Managed Care team.   If you are experiencing a medical emergency, please call 911 or report to your local emergency department or urgent care.   If you have a non-emergency medical problem during routine business hours, please contact your provider's office and ask to speak with a nurse.   For questions related to your [facility], please call: [phone] or visit the homepage here: [email or web address]  If you would like to schedule transportation through your [facility], please call the following number at least 2 days in advance of your appointment: [phone]   Rides for urgent appointments can also be made after hours by calling Member Services.  Call the Behavioral Health Crisis Line at [phone], at any time, 24 hours a day, 7 days a week. If you are in danger or need immediate medical attention call 911.  If you would like help to quit smoking, call 1-800-QUIT-NOW ((306)090-4144) OR Espaol: 1-855-Djelo-Ya (1-324-401-0272) o para ms informacin haga clic aqu or Text READY to 536-644 to register via text  Mr. Villavicencio - following are the goals we discussed in your visit today:   Goals Addressed             This Visit's Progress    VBCI Social Work Care Plan       Problems:   Patient in need of ongoing mental health follow up to address symptoms of anxiety and depression  CSW Clinical Goal(s):   Clinical goal of ongoing follow up with a mental health therapist and psychiatrist met  Interventions:  Mental Health:  Evaluation of current treatment plan related to Anxiety with Social Anxiety,, Depression: depressed mood, and  PTSD Active listening / Reflection utilized Emotional Support Provided Encouraged continued use of coping strategies discussed(occupies time working on various projects to manage symptoms of depression and anxiety) Continued participation in ongoing mental health  counseling through [facility] and medication management through [facility] encouraged  PHQ2/PHQ9 completed GAD 7 completed  Patient Goals/Self-Care Activities:  Continue taking your medication as prescribed             Discuss any concerns with your medications with your provider              Continue with mental health therapy through [facility].              Continue to work with Pharmacist, hospital regarding the status of your VA benefits  Plan:   No further follow up required: patient to continue to follow up with [facility] and [facility] for ongoing mental health follow up          Patient verbalizes understanding of instructions and care plan provided today and agrees to view in [place]. Active MyChart status and patient understanding of how to access instructions and care plan via MyChart confirmed with patient.     No further follow up required: patient to continue to follow up with mental health providers for ongoing follow up.  Marjan Rosman, LCSW [place]  [facility], [facility] Licensed Clinical Social Worker Care Coordinator  Direct Dial: [phone]     Following is a copy of your plan of care:  There are no care plans that you recently modified to display for this  patient.

## 2024-02-09 ENCOUNTER — Ambulatory Visit: Attending: Pulmonary Disease

## 2024-02-09 ENCOUNTER — Ambulatory Visit: Admitting: Psychiatry

## 2024-02-09 DIAGNOSIS — R0602 Shortness of breath: Secondary | ICD-10-CM | POA: Diagnosis not present

## 2024-02-09 DIAGNOSIS — J449 Chronic obstructive pulmonary disease, unspecified: Secondary | ICD-10-CM | POA: Insufficient documentation

## 2024-02-09 DIAGNOSIS — F17210 Nicotine dependence, cigarettes, uncomplicated: Secondary | ICD-10-CM | POA: Insufficient documentation

## 2024-02-09 LAB — PULMONARY FUNCTION TEST ARMC ONLY
DL/VA % pred: 84 %
DL/VA: 3.86 ml/min/mmHg/L
DLCO unc % pred: 74 %
DLCO unc: 19.4 ml/min/mmHg
FEF 25-75 Post: 0.72 L/s
FEF 25-75 Pre: 2.1 L/s
FEF2575-%Change-Post: -65 %
FEF2575-%Pred-Post: 22 %
FEF2575-%Pred-Pre: 65 %
FEV1-%Change-Post: -35 %
FEV1-%Pred-Post: 43 %
FEV1-%Pred-Pre: 67 %
FEV1-Post: 1.53 L
FEV1-Pre: 2.37 L
FEV1FVC-%Change-Post: -28 %
FEV1FVC-%Pred-Pre: 87 %
FEV6-%Change-Post: -5 %
FEV6-%Pred-Post: 72 %
FEV6-%Pred-Pre: 76 %
FEV6-Post: 3.14 L
FEV6-Pre: 3.31 L
FEV6FVC-%Change-Post: 0 %
FEV6FVC-%Pred-Post: 103 %
FEV6FVC-%Pred-Pre: 103 %
FVC-%Change-Post: -9 %
FVC-%Pred-Post: 70 %
FVC-%Pred-Pre: 77 %
FVC-Post: 3.14 L
FVC-Pre: 3.46 L
Post FEV1/FVC ratio: 49 %
Post FEV6/FVC ratio: 100 %
Pre FEV1/FVC ratio: 68 %
Pre FEV6/FVC Ratio: 100 %
RV % pred: 87 %
RV: 1.55 L
TLC % pred: 82 %
TLC: 5.06 L

## 2024-02-09 MED ORDER — ALBUTEROL SULFATE (2.5 MG/3ML) 0.083% IN NEBU
2.5000 mg | INHALATION_SOLUTION | Freq: Once | RESPIRATORY_TRACT | Status: AC
  Administered 2024-02-09: 2.5 mg via RESPIRATORY_TRACT

## 2024-02-16 ENCOUNTER — Encounter: Payer: Self-pay | Admitting: Pulmonary Disease

## 2024-02-16 ENCOUNTER — Ambulatory Visit: Payer: Medicaid Other | Admitting: Pulmonary Disease

## 2024-02-16 VITALS — BP 120/76 | HR 65 | Temp 97.6°F | Ht 66.0 in | Wt 172.8 lb

## 2024-02-16 DIAGNOSIS — F17210 Nicotine dependence, cigarettes, uncomplicated: Secondary | ICD-10-CM | POA: Diagnosis not present

## 2024-02-16 DIAGNOSIS — J4489 Other specified chronic obstructive pulmonary disease: Secondary | ICD-10-CM

## 2024-02-16 DIAGNOSIS — I255 Ischemic cardiomyopathy: Secondary | ICD-10-CM

## 2024-02-16 DIAGNOSIS — J439 Emphysema, unspecified: Secondary | ICD-10-CM

## 2024-02-16 NOTE — Progress Notes (Signed)
 Subjective:    Patient ID: Patrick Short, male    DOB: [birth year], 48 y.o.   MRN: [id number]  Patient Care Team: Mimi Alt, MD as PCP - General (Family Medicine) End, Veryl Gottron, MD as PCP - Cardiology (Cardiology) Lei Pump, MD as PCP - Electrophysiology (Cardiology) Twana Gal, [place] as Physician Assistant (Cardiology)  Chief Complaint  Patient presents with   Follow-up    Occasional cough, shortness of breath on exertion and wheezing.     BACKGROUND/INTERVAL:48 year old current smoker who presents for follow-up on the issues of shortness of breath and productive cough. COPD with chronic bronchitis, patient had PFTs on 29 April. We initially saw him on 05 October 2023 at that time started him on Stiolto 2 inhalations daily and as needed albuterol . He has issues with ischemic cardiomyopathy and atrial fibrillation and follows with cardiology.   HPI Discussed the use of AI scribe software for clinical note transcription with the patient, who gave verbal consent to proceed.  History of Present Illness   Patrick Short is a 48 year old male with moderate COPD who presents with shortness of breath.  He experiences shortness of breath associated with his mild to moderate COPD. He occasionally smokes, though less frequently than before. He uses Stiolto daily, which he finds helpful in managing his symptoms.  He has recently returned to work, engaging in physical activities such as building decks and buildings, which he describes as 'a little bit of everything'. No current wheezing is noted, and his breathing has not progressed to a severe stage. He manages his symptoms with his current medication regimen and has not required any changes in dosage.  No wheezing.  No cough or sputum production.  No hemoptysis.  No fevers, chills or sweats.  Overall he feels well and looks well.   DATA 12/23/2023 echocardiogram: LVEF 50 to 55%, no regional wall motion  abnormalities, RV function normal.  Left atrial size mildly dilated.  Mild mitral regurgitation. 02/09/2024 PFTs: FEV1 2.37 L or 67% predicted, FVC 3.46 L or 77% predicted, FEV1/FVC 68%, lung volumes very mildly reduced, minimal diffusion defect.  No bronchodilator response.  Overall mild combined obstructive restrictive physiology.  Review of Systems A 10 point review of systems was performed and it is as noted above otherwise negative.   Patient Active Problem List   Diagnosis Date Noted   Pain of left lower extremity 08/28/2023   Moderate persistent asthma without complication 05/22/2023   Hypercoagulable state due to paroxysmal atrial fibrillation (HCC) 10/30/2022   Elevated glucose 09/08/2022   BMI 26.0-26.9,adult 09/08/2022   Cardiomyopathy (HCC) 10/25/2021   Sleep disturbance 09/10/2021   Atrial fibrillation (HCC) 07/02/2021   PTSD (post-traumatic stress disorder) 03/20/2021   Chronic pain 03/20/2021   Elevated LDL cholesterol level 03/20/2021   Restless leg syndrome 01/28/2021   Depression 01/09/2021   Anxiety 01/09/2021   Essential hypertension 01/09/2021   Tobacco abuse 01/08/2021    Social History   Tobacco Use   Smoking status: Every Day    Current packs/day: 0.50    Average packs/day: 0.7 packs/day for 37.5 years (25.0 ttl pk-yrs)    Types: Cigarettes   Smokeless tobacco: Never   Tobacco comments:    6 cigarettes a day-khj 12/11/2023    Started smoking at 48 years old.    Smoked 2 PPD at his heaviest   Substance Use Topics   Alcohol use: Not Currently    Allergies  Allergen Reactions   Aspirin Anaphylaxis and  Shortness Of Breath   Bee Venom Anaphylaxis   Penicillins Rash and Anaphylaxis   Sulfa Antibiotics Anaphylaxis    Current Meds  Medication Sig   apixaban  (ELIQUIS ) 5 MG TABS tablet Take 1 tablet (5 mg total) by mouth 2 (two) times daily.   diltiazem  (CARDIZEM  CD) 240 MG 24 hr capsule Take 1 capsule (240 mg total) by mouth daily.   EPINEPHrine  0.3  mg/0.3 mL IJ SOAJ injection Inject 0.3 mg into the muscle as needed for anaphylaxis.   escitalopram  (LEXAPRO ) 5 MG tablet Take 1 tablet (5 mg total) by mouth daily.   prazosin  (MINIPRESS ) 1 MG capsule Take 1 capsule (1 mg total) by mouth at bedtime.   rOPINIRole  (REQUIP ) 1 MG tablet Take 1 tablet (1 mg total) by mouth at bedtime.   Tiotropium Bromide-Olodaterol (STIOLTO RESPIMAT ) 2.5-2.5 MCG/ACT AERS Inhale 2 puffs into the lungs daily.   traZODone  (DESYREL ) 100 MG tablet Take 1 tablet (100 mg total) by mouth at bedtime as needed. for sleep    Immunization History  Administered Date(s) Administered   Influenza, Seasonal, Injecte, Preservative Fre 07/20/2023   Influenza,inj,Quad PF,6+ Mos 09/10/2021, 08/20/2022   PNEUMOCOCCAL CONJUGATE-20 01/11/2024   Pneumococcal Polysaccharide-23 09/10/2021   Td 01/09/2019        Objective:     BP 120/76 (BP Location: Left Arm, Patient Position: Sitting, Cuff Size: Normal)   Pulse 65   Temp 97.6 F (36.4 C) (Temporal)   Ht 5\' 6"  (1.676 m)   Wt 172 lb 12.8 oz (78.4 kg)   SpO2 96%   BMI 27.89 kg/m   SpO2: 96 %  GENERAL: Well-developed, well-nourished gentleman, no acute distress, fully ambulatory, no conversational dyspnea. HEAD: Normocephalic, atraumatic.  EYES: Pupils equal, round, reactive to light.  No scleral icterus.  MOUTH: Teeth intact, pharynx clear, oral mucosa moist.  No thrush. NECK: Supple. No thyromegaly. Trachea midline. No JVD.  No adenopathy. PULMONARY: Good air entry bilaterally.  No adventitious sounds. CARDIOVASCULAR: S1 and S2. Regular rate and rhythm. No rubs, murmurs or gallops heard. ABDOMEN: Benign. MUSCULOSKELETAL: No joint deformity, no clubbing, no edema.  NEUROLOGIC: No overt focal deficit, no gait disturbance, speech is fluent. SKIN: Intact,warm,dry. PSYCH: Calm, cooperative.     Assessment & Plan:     ICD-10-CM   1. COPD with chronic bronchitis and emphysema (HCC)  J44.89    J43.9     2. Ischemic  cardiomyopathy  I25.5     3. Tobacco dependence due to cigarettes  F17.210       Discussion:    Mild to moderate COPD Mild to moderate COPD, well-managed with current treatment. He reports occasional smoking.  Notes Stiolto effective. No wheezing on examination, indicating good symptom control. - Continue Stiolto regimen. - Schedule follow-up in four months.      Smoking cessation instruction/counseling given:  counseled patient on the dangers of tobacco use, advised patient to stop smoking, and reviewed strategies to maximize success.   Advised if symptoms do not improve or worsen, to please contact office for sooner follow up or seek emergency care.    I spent 30 minutes of dedicated to the care of this patient on the date of this encounter to include pre-visit review of records, face-to-face time with the patient discussing conditions above, post visit ordering of testing, clinical documentation with the electronic health record, making appropriate referrals as documented, and communicating necessary findings to members of the patients care team.     C. Chloe Counter, MD Advanced Bronchoscopy  PCCM [facility]    *This note was generated using voice recognition software/Dragon and/or AI transcription program.  Despite best efforts to proofread, errors can occur which can change the meaning. Any transcriptional errors that result from this process are unintentional and may not be fully corrected at the time of dictation.

## 2024-02-16 NOTE — Patient Instructions (Signed)
 VISIT SUMMARY:  Today, you were seen for your moderate COPD and shortness of breath. You mentioned that you occasionally smoke but have reduced your frequency. You are currently using Stiolto daily, which you find helpful in managing your symptoms. You have recently returned to work, engaging in physical activities such as building decks and buildings. No wheezing was noted during your examination, and your breathing has not progressed to a severe stage.  YOUR PLAN:  - MILD to MODERATE COPD: Chronic Obstructive Pulmonary Disease (COPD) is a chronic inflammatory lung disease that causes obstructed airflow from the lungs. Your COPD is currently well-managed with your daily use of Stiolto. You should continue with your current Stiolto regimen. We will schedule a follow-up appointment in four months to monitor your condition.  INSTRUCTIONS:  Please continue using your Stiolto as prescribed. Schedule a follow-up appointment in four months to monitor your COPD.

## 2024-02-23 ENCOUNTER — Other Ambulatory Visit: Payer: Self-pay

## 2024-02-24 ENCOUNTER — Other Ambulatory Visit: Payer: Self-pay

## 2024-02-25 ENCOUNTER — Other Ambulatory Visit (HOSPITAL_COMMUNITY): Payer: Self-pay | Admitting: Psychiatry

## 2024-02-25 ENCOUNTER — Other Ambulatory Visit: Payer: Self-pay

## 2024-02-25 DIAGNOSIS — F411 Generalized anxiety disorder: Secondary | ICD-10-CM

## 2024-02-25 DIAGNOSIS — F331 Major depressive disorder, recurrent, moderate: Secondary | ICD-10-CM

## 2024-02-28 ENCOUNTER — Other Ambulatory Visit: Payer: Self-pay

## 2024-02-28 ENCOUNTER — Other Ambulatory Visit: Payer: Self-pay | Admitting: Family Medicine

## 2024-02-29 ENCOUNTER — Other Ambulatory Visit: Payer: Self-pay | Admitting: Family Medicine

## 2024-02-29 ENCOUNTER — Other Ambulatory Visit: Payer: Self-pay

## 2024-02-29 ENCOUNTER — Other Ambulatory Visit (HOSPITAL_COMMUNITY): Payer: Self-pay | Admitting: Psychiatry

## 2024-02-29 DIAGNOSIS — F331 Major depressive disorder, recurrent, moderate: Secondary | ICD-10-CM

## 2024-02-29 DIAGNOSIS — F411 Generalized anxiety disorder: Secondary | ICD-10-CM

## 2024-02-29 MED ORDER — ROPINIROLE HCL 1 MG PO TABS
1.0000 mg | ORAL_TABLET | Freq: Every day | ORAL | 2 refills | Status: AC
  Filled 2024-02-29: qty 90, 90d supply, fill #0
  Filled 2024-10-31: qty 30, 30d supply, fill #1

## 2024-03-01 ENCOUNTER — Other Ambulatory Visit: Payer: Self-pay

## 2024-03-02 ENCOUNTER — Other Ambulatory Visit: Payer: Self-pay

## 2024-03-04 DIAGNOSIS — Z1211 Encounter for screening for malignant neoplasm of colon: Secondary | ICD-10-CM | POA: Diagnosis not present

## 2024-03-14 ENCOUNTER — Ambulatory Visit: Payer: Self-pay | Admitting: Family Medicine

## 2024-03-14 LAB — COLOGUARD: COLOGUARD: NEGATIVE

## 2024-03-17 ENCOUNTER — Other Ambulatory Visit: Payer: Self-pay

## 2024-03-18 ENCOUNTER — Other Ambulatory Visit (HOSPITAL_COMMUNITY): Payer: Self-pay | Admitting: Psychiatry

## 2024-03-18 ENCOUNTER — Other Ambulatory Visit: Payer: Self-pay

## 2024-03-18 DIAGNOSIS — F411 Generalized anxiety disorder: Secondary | ICD-10-CM

## 2024-03-18 DIAGNOSIS — F331 Major depressive disorder, recurrent, moderate: Secondary | ICD-10-CM

## 2024-03-21 ENCOUNTER — Other Ambulatory Visit: Payer: Self-pay | Admitting: Family Medicine

## 2024-03-21 ENCOUNTER — Other Ambulatory Visit: Payer: Self-pay

## 2024-03-21 DIAGNOSIS — F411 Generalized anxiety disorder: Secondary | ICD-10-CM

## 2024-03-21 DIAGNOSIS — F331 Major depressive disorder, recurrent, moderate: Secondary | ICD-10-CM

## 2024-03-21 NOTE — Telephone Encounter (Signed)
 Copied from CRM [id number]. Topic: Clinical - Medication Refill >> Mar 21, 2024  1:28 PM [place] S wrote: Medication: EPINEPHrine  0.3 mg/0.3 mL IJ SOAJ injection;quantity of 4  Has the patient contacted their pharmacy? Yes (Agent: If no, request that the patient contact the pharmacy for the refill. If patient does not wish to contact the pharmacy document the reason why and proceed with request.) (Agent: If yes, when and what did the pharmacy advise?)Pharmacy advised new prescription needed.  This is the patient's preferred pharmacy:  [facility] [address] [place] [address] Phone: [phone] Fax: [phone]  Is this the correct pharmacy for this prescription? Yes If no, delete pharmacy and type the correct one.   Has the prescription been filled recently? No  Is the patient out of the medication? Yes  Has the patient been seen for an appointment in the last year OR does the patient have an upcoming appointment? Yes  Can we respond through MyChart? No  Agent: Please be advised that Rx refills may take up to 3 business days. We ask that you follow-up with your pharmacy.

## 2024-03-22 NOTE — Telephone Encounter (Signed)
 Requested medication (s) are due for refill today: yes  Requested medication (s) are on the active medication list: yes  Last refill:  01/28/23  Future visit scheduled: yes  Notes to clinic:  from previous prescriber Mariah Shines, NP. Please review for refill      Requested Prescriptions  Pending Prescriptions Disp Refills   EPINEPHrine  0.3 mg/0.3 mL IJ SOAJ injection 1 each 1    Sig: Inject 0.3 mg into the muscle as needed for anaphylaxis.     Immunology: Antidotes Passed - 03/22/2024 12:52 PM      Passed - Valid encounter within last 12 months    Recent Outpatient Visits           2 months ago Annual physical exam   [facility], [place], MD   2 months ago Essential hypertension   [facility], Judyann Number, MD

## 2024-03-24 ENCOUNTER — Other Ambulatory Visit: Payer: Self-pay

## 2024-03-24 ENCOUNTER — Other Ambulatory Visit (HOSPITAL_COMMUNITY): Payer: Self-pay

## 2024-03-24 ENCOUNTER — Encounter: Payer: Self-pay | Admitting: Family Medicine

## 2024-03-24 ENCOUNTER — Other Ambulatory Visit: Payer: Self-pay | Admitting: Family Medicine

## 2024-03-24 DIAGNOSIS — F411 Generalized anxiety disorder: Secondary | ICD-10-CM

## 2024-03-24 DIAGNOSIS — F331 Major depressive disorder, recurrent, moderate: Secondary | ICD-10-CM

## 2024-03-24 MED ORDER — ESCITALOPRAM OXALATE 5 MG PO TABS
5.0000 mg | ORAL_TABLET | Freq: Every day | ORAL | 1 refills | Status: DC
  Filled 2024-03-24 (×3): qty 90, 90d supply, fill #0

## 2024-03-24 NOTE — Telephone Encounter (Signed)
 This encounter was created in error - please disregard.

## 2024-03-24 NOTE — Telephone Encounter (Signed)
 Copied from CRM [id number]. Topic: Clinical - Medication Refill >> Mar 24, 2024  1:12 PM Felizardo Hotter wrote: Medication: escitalopram  (LEXAPRO ) 5 MG tablet  Has the patient contacted their pharmacy? Yes (Agent: If no, request that the patient contact the pharmacy for the refill. If patient does not wish to contact the pharmacy document the reason why and proceed with request.) (Agent: If yes, when and what did the pharmacy advise?)  This is the patient's preferred pharmacy:  [facility] [address] [place] [address] Phone: [phone] Fax: [phone]  Is this the correct pharmacy for this prescription? Yes If no, delete pharmacy and type the correct one.   Has the prescription been filled recently? Yes  Is the patient out of the medication? Yes  Has the patient been seen for an appointment in the last year OR does the patient have an upcoming appointment? Yes  Can we respond through MyChart? Yes  Agent: Please be advised that Rx refills may take up to 3 business days. We ask that you follow-up with your pharmacy.

## 2024-03-25 ENCOUNTER — Other Ambulatory Visit: Payer: Self-pay

## 2024-03-28 ENCOUNTER — Other Ambulatory Visit: Payer: Self-pay

## 2024-03-28 MED ORDER — EPINEPHRINE 0.3 MG/0.3ML IJ SOAJ
0.3000 mg | INTRAMUSCULAR | 1 refills | Status: AC | PRN
  Filled 2024-03-28: qty 2, 30d supply, fill #0

## 2024-04-04 ENCOUNTER — Other Ambulatory Visit (HOSPITAL_COMMUNITY): Payer: Self-pay

## 2024-04-11 ENCOUNTER — Ambulatory Visit: Admitting: Family Medicine

## 2024-04-12 ENCOUNTER — Other Ambulatory Visit (HOSPITAL_BASED_OUTPATIENT_CLINIC_OR_DEPARTMENT_OTHER): Payer: Self-pay

## 2024-04-12 ENCOUNTER — Ambulatory Visit: Admitting: Family Medicine

## 2024-04-12 ENCOUNTER — Other Ambulatory Visit: Payer: Self-pay

## 2024-04-12 VITALS — BP 105/68 | HR 75 | Ht 66.0 in | Wt 167.0 lb

## 2024-04-12 DIAGNOSIS — I10 Essential (primary) hypertension: Secondary | ICD-10-CM | POA: Diagnosis not present

## 2024-04-12 DIAGNOSIS — G2581 Restless legs syndrome: Secondary | ICD-10-CM

## 2024-04-12 DIAGNOSIS — F5104 Psychophysiologic insomnia: Secondary | ICD-10-CM | POA: Diagnosis not present

## 2024-04-12 DIAGNOSIS — F419 Anxiety disorder, unspecified: Secondary | ICD-10-CM

## 2024-04-12 DIAGNOSIS — I429 Cardiomyopathy, unspecified: Secondary | ICD-10-CM

## 2024-04-12 DIAGNOSIS — I4819 Other persistent atrial fibrillation: Secondary | ICD-10-CM

## 2024-04-12 DIAGNOSIS — F4310 Post-traumatic stress disorder, unspecified: Secondary | ICD-10-CM

## 2024-04-12 DIAGNOSIS — J4540 Moderate persistent asthma, uncomplicated: Secondary | ICD-10-CM

## 2024-04-12 MED ORDER — TRAZODONE HCL 100 MG PO TABS
100.0000 mg | ORAL_TABLET | Freq: Every evening | ORAL | 2 refills | Status: AC | PRN
  Filled 2024-04-12 – 2024-05-02 (×3): qty 90, 90d supply, fill #0

## 2024-04-12 MED ORDER — PRAZOSIN HCL 1 MG PO CAPS
1.0000 mg | ORAL_CAPSULE | Freq: Every day | ORAL | 2 refills | Status: DC
Start: 2024-04-12 — End: 2024-05-27
  Filled 2024-04-12: qty 90, 90d supply, fill #0
  Filled 2024-05-02: qty 30, 30d supply, fill #0

## 2024-04-12 NOTE — Assessment & Plan Note (Signed)
 Post-Traumatic Stress Disorder (PTSD) and Anxiety Symptoms are improving, indicating a positive response to the current treatment regimen. He is in the process of obtaining a referral to [facility] for further management but faces logistical challenges in attending appointments due to work commitments. His anxiety score is 6, which is consistent and improved from previous scores. - Continue Lexapro  5 mg daily - Continue Prazosin  1 mg at bedtime - Encourage attendance at [facility] for further management - Advise setting aside a day to attend [facility] appointments

## 2024-04-12 NOTE — Assessment & Plan Note (Signed)
 Chronic Well controlled   Continue Diltiazem  240 mg daily - f/u with cardiology as scheduled

## 2024-04-12 NOTE — Assessment & Plan Note (Signed)
 Atrial Fibrillation and Hypertension Chronic, stable, HR well controlled  Conditions are managed with Eliquis  and diltiazem . No acute issues or changes in condition. - Continue Eliquis  5 mg twice daily - Continue Diltiazem  240 mg daily - f/u with cardiology as scheduled

## 2024-04-12 NOTE — Assessment & Plan Note (Signed)
 Sleep Disturbance Chronic  Trazodone  is effective for sleep. Requires a refill as the current prescription was only for a 30-day supply. - Refill Trazodone  100 mg at night

## 2024-04-12 NOTE — Assessment & Plan Note (Signed)
 Chronic  well-controlled with current medication. Reports no issues with lung function, and physical exam reveals no wheezing or crackles. - Continue Tiotropium/Olodaterol 2.5/2.5 mcg, 2 puffs daily - continue to follow up with pulm as scheduled

## 2024-04-12 NOTE — Progress Notes (Signed)
 Established patient visit   Patient: Patrick Short   DOB: 1976/08/10   48 y.o. Male  MRN: [id number] Visit Date: 04/12/2024  Today's healthcare provider: Rockie Agent, MD   Chief Complaint  Patient presents with   Anxiety    Things are still the same    Subjective     HPI     Anxiety    Additional comments: Things are still the same       Last edited by Thelbert Eulalio HERO, CMA on 04/12/2024  3:49 PM.       Discussed the use of AI scribe software for clinical note transcription with the patient, who gave verbal consent to proceed.  History of Present Illness Patrick Short is a 48 year old male with PTSD and anxiety who presents for follow-up.  He is experiencing improvement in his PTSD and anxiety symptoms, describing it as a 'work in progress.' He is attempting to secure an appointment with [facility] but faces challenges due to his first-come, first-serve policy and his work schedule. He is interested in attending classes offered by [facility] that his insurance will cover. His current medications include Lexapro  5 mg daily and Prazosin  1 mg at bedtime.  He has a history of atrial fibrillation, hypertension, and cardiomyopathy. He continues to take Eliquis  5 mg twice daily for atrial fibrillation and diltiazem  240 mg daily for hypertension.  He has moderate persistent asthma and reports that his lung condition is 'pretty good.' He continues to use tiotropium bromide and olodaterol 2.5 mcg, two puffs daily. No wheezing or crackles and regular breathing rhythm.  He experiences chronic stable restless legs syndrome and continues to take Requip  1 mg at bedtime.  For sleep, he takes trazodone  100 mg at night. He recently had his trazodone  prescription changed to 100 mg tablets instead of two 50 mg tablets, but only received a 30-day supply.  He mentions a history of small cell lung cancer and has been taking fenbendazole for the past seven months, which he describes as an  unconventional treatment.     Past Medical History:  Diagnosis Date   Allergy    Anxiety    Arthritis    Blood transfusion without reported diagnosis    Cancer (HCC)    Clotting disorder (HCC)    Depression    Dysrhythmia    Hypertension    Mineral deficiency    Neuromuscular disorder (HCC)    Paroxysmal atrial fibrillation (HCC)    PTSD (post-traumatic stress disorder)     Medications: Outpatient Medications Prior to Visit  Medication Sig   apixaban  (ELIQUIS ) 5 MG TABS tablet Take 1 tablet (5 mg total) by mouth 2 (two) times daily.   diltiazem  (CARDIZEM  CD) 240 MG 24 hr capsule Take 1 capsule (240 mg total) by mouth daily.   EPINEPHrine  0.3 mg/0.3 mL IJ SOAJ injection Inject 0.3 mg into the muscle as needed for anaphylaxis.   escitalopram  (LEXAPRO ) 5 MG tablet Take 1 tablet (5 mg total) by mouth daily.   rOPINIRole  (REQUIP ) 1 MG tablet Take 1 tablet (1 mg total) by mouth at bedtime.   Tiotropium Bromide-Olodaterol (STIOLTO RESPIMAT ) 2.5-2.5 MCG/ACT AERS Inhale 2 puffs into the lungs daily.   [DISCONTINUED] prazosin  (MINIPRESS ) 1 MG capsule Take 1 capsule (1 mg total) by mouth at bedtime.   [DISCONTINUED] traZODone  (DESYREL ) 100 MG tablet Take 1 tablet (100 mg total) by mouth at bedtime as needed. for sleep   No facility-administered medications prior to  visit.      04/12/2024    3:49 PM 02/08/2024    4:25 PM 01/11/2024    3:25 PM 11/04/2023    3:41 PM  GAD 7 : Generalized Anxiety Score  Nervous, Anxious, on Edge 1 1 1 2   Control/stop worrying 1 1 1 1   Worry too much - different things 1 1 1 1   Trouble relaxing 1 0 0 2  Restless 1 1 1 2   Easily annoyed or irritable 1 1 1 2   Afraid - awful might happen 0 1 1 0  Total GAD 7 Score 6 6 6 10   Anxiety Difficulty Somewhat difficult Somewhat difficult Not difficult at all Somewhat difficult    Flowsheet Row Office Visit from 04/12/2024 in [facility]  PHQ-9 Total Score 6    Review of  Systems      Objective    BP 105/68   Pulse 75   Ht 5' 6 (1.676 m)   Wt 167 lb (75.8 kg)   SpO2 97%   BMI 26.95 kg/m  BP Readings from Last 3 Encounters:  04/12/24 105/68  02/16/24 120/76  01/11/24 107/63   Wt Readings from Last 3 Encounters:  04/12/24 167 lb (75.8 kg)  02/16/24 172 lb 12.8 oz (78.4 kg)  01/11/24 168 lb (76.2 kg)        Physical Exam  Physical Exam  General: Alert, no acute distress Cardio: Normal S1 and S2, RRR, no r/m/g Pulm: CTAB, normal work of breathing ABD: normal BS  Extremities: no LE edema  Psych: normal mood and affect, normal rate and volume of speech, no objective findings to indicate pt is responding to internal stimuli     No results found for any visits on 04/12/24.  Assessment & Plan     Problem List Items Addressed This Visit       Cardiovascular and Mediastinum   Essential hypertension   Chronic Well controlled   Continue Diltiazem  240 mg daily - f/u with cardiology as scheduled       Relevant Medications   prazosin  (MINIPRESS ) 1 MG capsule   Cardiomyopathy (HCC)   Relevant Medications   prazosin  (MINIPRESS ) 1 MG capsule   Atrial fibrillation (HCC)   Atrial Fibrillation and Hypertension Chronic, stable, HR well controlled  Conditions are managed with Eliquis  and diltiazem . No acute issues or changes in condition. - Continue Eliquis  5 mg twice daily - Continue Diltiazem  240 mg daily - f/u with cardiology as scheduled       Relevant Medications   prazosin  (MINIPRESS ) 1 MG capsule     Respiratory   Moderate persistent asthma without complication    Chronic  well-controlled with current medication. Reports no issues with lung function, and physical exam reveals no wheezing or crackles. - Continue Tiotropium/Olodaterol 2.5/2.5 mcg, 2 puffs daily - continue to follow up with pulm as scheduled         Other   Restless leg syndrome   Chronic  Condition is well-managed with current treatment. - Continue  Requip  1 mg at bedtime      PTSD (post-traumatic stress disorder)   Post-Traumatic Stress Disorder (PTSD) and Anxiety Symptoms are improving, indicating a positive response to the current treatment regimen. He is in the process of obtaining a referral to [facility] for further management but faces logistical challenges in attending appointments due to work commitments. His anxiety score is 6, which is consistent and improved from previous scores. - Continue Lexapro  5 mg daily -  Continue Prazosin  1 mg at bedtime - Encourage attendance at [facility] for further management - Advise setting aside a day to attend [facility] appointments      Relevant Medications   prazosin  (MINIPRESS ) 1 MG capsule   traZODone  (DESYREL ) 100 MG tablet   Psychophysiological insomnia   Sleep Disturbance Chronic  Trazodone  is effective for sleep. Requires a refill as the current prescription was only for a 30-day supply. - Refill Trazodone  100 mg at night      Anxiety - Primary   Relevant Medications   traZODone  (DESYREL ) 100 MG tablet    Assessment & Plan        Return in about 6 months (around 10/13/2024) for CHRONIC F/U.         Rockie Agent, MD  [facility] [phone] (phone) [phone] (fax)  [facility]

## 2024-04-12 NOTE — Assessment & Plan Note (Signed)
 Chronic  Condition is well-managed with current treatment. - Continue Requip  1 mg at bedtime

## 2024-04-20 ENCOUNTER — Other Ambulatory Visit: Payer: Self-pay

## 2024-05-02 ENCOUNTER — Other Ambulatory Visit: Payer: Self-pay

## 2024-05-26 ENCOUNTER — Other Ambulatory Visit: Payer: Self-pay

## 2024-05-26 DIAGNOSIS — F4310 Post-traumatic stress disorder, unspecified: Secondary | ICD-10-CM

## 2024-05-26 DIAGNOSIS — F411 Generalized anxiety disorder: Secondary | ICD-10-CM

## 2024-05-26 DIAGNOSIS — F331 Major depressive disorder, recurrent, moderate: Secondary | ICD-10-CM

## 2024-05-26 NOTE — Telephone Encounter (Signed)
Please see the message below and advise.

## 2024-05-26 NOTE — Telephone Encounter (Signed)
 Copied from CRM [id number]. Topic: Clinical - Medication Question >> May 26, 2024  1:12 PM Selinda RAMAN wrote: Reason for CRM: Sari the spouse of the patient called to state her husband is in jail and need of refills on his medications. She said he has been having a tough time getting his meds and Sari wants to know if there is anyway his provider can send refills on several medications to the [facility] he [facility] - [facility]. She said he needs his apixaban  (ELIQUIS ) 5 MG TABS tablet, diltiazem  (CARDIZEM  CD) 240 MG 24 hr capsule, prazosin  (MINIPRESS ) 1 MG capsule,  escitalopram  (LEXAPRO ) 5 MG tablet, Tiotropium Bromide-Olodaterol (STIOLTO RESPIMAT ) 2.5-2.5 MCG/ACT AERS, Tiotropium Bromide-Olodaterol (STIOLTO RESPIMAT ) 2.5-2.5 MCG/ACT AERS. Please assist patient further by contacting Sari his wife at [phone]. She says she just needs to know what to do including if she needs to contact the pharmacy.

## 2024-05-27 ENCOUNTER — Other Ambulatory Visit: Payer: Self-pay

## 2024-05-27 MED ORDER — ESCITALOPRAM OXALATE 5 MG PO TABS
5.0000 mg | ORAL_TABLET | Freq: Every day | ORAL | 1 refills | Status: AC
  Filled 2024-05-27 – 2024-10-31 (×2): qty 90, 90d supply, fill #0

## 2024-05-27 MED ORDER — STIOLTO RESPIMAT 2.5-2.5 MCG/ACT IN AERS
2.0000 | INHALATION_SPRAY | Freq: Every day | RESPIRATORY_TRACT | 3 refills | Status: AC
  Filled 2024-05-27 – 2024-10-31 (×2): qty 4, 30d supply, fill #0

## 2024-05-27 MED ORDER — APIXABAN 5 MG PO TABS
5.0000 mg | ORAL_TABLET | Freq: Two times a day (BID) | ORAL | 3 refills | Status: AC
  Filled 2024-05-27 – 2024-10-31 (×3): qty 180, 90d supply, fill #0

## 2024-05-27 MED ORDER — PRAZOSIN HCL 1 MG PO CAPS
1.0000 mg | ORAL_CAPSULE | Freq: Every day | ORAL | 2 refills | Status: AC
  Filled 2024-05-27 – 2024-10-31 (×2): qty 90, 90d supply, fill #0

## 2024-05-27 MED ORDER — DILTIAZEM HCL ER COATED BEADS 240 MG PO CP24
240.0000 mg | ORAL_CAPSULE | Freq: Every day | ORAL | 3 refills | Status: AC
  Filled 2024-05-27 – 2024-10-31 (×2): qty 90, 90d supply, fill #0

## 2024-05-27 NOTE — Addendum Note (Signed)
 Addended by: Jahaad Penado E on: 05/27/2024 09:50 AM   Modules accepted: Orders

## 2024-05-27 NOTE — Telephone Encounter (Signed)
 Ok to send medications to requested pharmacy

## 2024-06-07 ENCOUNTER — Other Ambulatory Visit: Payer: Self-pay

## 2024-06-09 ENCOUNTER — Other Ambulatory Visit: Payer: Self-pay

## 2024-06-21 ENCOUNTER — Ambulatory Visit: Admitting: Pulmonary Disease

## 2024-06-22 ENCOUNTER — Other Ambulatory Visit: Payer: Self-pay

## 2024-07-06 ENCOUNTER — Telehealth: Payer: Self-pay | Admitting: Emergency Medicine

## 2024-07-06 NOTE — Telephone Encounter (Signed)
 Per Daril Kicks, PA, patient was due in August 2025 for a 68-month follow-up after the February 2025 visit, and to see Dr. Sherrian Arthur.  Made a second attempt to contact the patient and spoke with the patient's wife, Sari. She reported that the patient is currently incarcerated and will not be released until January 2026.  I informed her that I will follow up in February 2026 to schedule the follow-up appointment or at a later date if needed.

## 2024-07-21 ENCOUNTER — Other Ambulatory Visit: Payer: Self-pay

## 2024-10-17 ENCOUNTER — Ambulatory Visit: Admitting: Family Medicine

## 2024-10-17 NOTE — Progress Notes (Deleted)
" ° °  Established Patient Office Visit  Patient ID: LEANDRE WIEN, male    DOB: 1975-10-22  Age: 49 y.o. MRN: [id number] PCP: Sharma Coyer, MD  No chief complaint on file.   Subjective:     HPI  Discussed the use of AI scribe software for clinical note transcription with the patient, who gave verbal consent to proceed.  History of Present Illness    {History (Optional):23778}  ROS    Objective:     There were no vitals taken for this visit. {Vitals History (Optional):23777}  Physical Exam  {PhysExam Abridge (Optional):210964309} No results found for any visits on 10/17/24.  {Labs (Optional):23779}  The 10-year ASCVD risk score (Arnett DK, et al., 2019) is: 7.1%    Assessment & Plan:   Problem List Items Addressed This Visit     Anxiety   Atrial fibrillation (HCC)   Cardiomyopathy (HCC) - Primary   Chronic pain   Depression   Elevated LDL cholesterol level   Essential hypertension   Moderate persistent asthma without complication   Psychophysiological insomnia   PTSD (post-traumatic stress disorder)   Restless leg syndrome    Assessment and Plan Assessment & Plan     No follow-ups on file.    Coyer Sharma, MD [facility]   "

## 2024-10-31 ENCOUNTER — Other Ambulatory Visit: Payer: Self-pay

## 2024-10-31 ENCOUNTER — Ambulatory Visit: Payer: Self-pay | Admitting: Family Medicine

## 2024-10-31 NOTE — Telephone Encounter (Signed)
 FYI Only or Action Required?: Action required by provider: medication refill request, clinical question for provider, update on patient condition, and sch'd 11/02/24.  Patient was last seen in primary care on 04/12/2024 by Sharma Coyer, MD.  Called Nurse Triage reporting Leg Pain.  Symptoms began several weeks ago.  Interventions attempted: Prescription medications: gabapentin and Other: needle/acupunture.  Symptoms are: gradually worsening.  Triage Disposition: See Physician Within 24 Hours  Patient/caregiver understands and will follow disposition?: Yes     Copied from CRM #[id number]. Topic: Clinical - Medication Question >> Oct 31, 2024 12:31 PM Tysheama G wrote: Reason for CRM: Patient stated he's been taking 10MG  of escitalopram  (LEXAPRO ) 5 MG tablet; but when trying to refill it it shows 5MG  and he's asking if he can go up to 15MG  if possible. If Dr.Simmons-Robinson or her nurse can call patient with the answer please. Callback number  [phone]    Reason for Disposition  Numbness in a leg or foot (i.e., loss of sensation)  Answer Assessment - Initial Assessment Questions Returned pt's call to f/u on rx request. Pt states he is taking Lexapro  5mg  daily but would like to increase to 10mg  daily. Pt also requested refill of Klonopin ; unable to see on current rx regimen. Pt states he has been out for a while. Pt also requested to scheduled with clinic ASAP for worsening neuropathy. Pt states he was taking gabapentin but he does not like side effects and would like to discuss alternative tx plans. Discussed appt with PCP vs. Alternative provider for sooner appt and pt agreeable to schedule with alternative provider. Appointment scheduled for evaluation. Patient agrees with plan of care, and will call back if anything changes, or if symptoms worsen.      1. ONSET: When did the pain start?      Ongoing; hx of neuropathy bilaterally but L leg worsening   2. LOCATION:  Where is the pain located?      L leg from buttocks/posterior gluteal down to foot   3. PAIN: How bad is the pain?    (Scale 1-10; or mild, moderate, severe)     Tingling; severe   4. WORK OR EXERCISE: Has there been any recent work or exercise that involved this part of the body?      No   5. CAUSE: What do you think is causing the leg pain?     Worsening neuropathy   6. OTHER SYMPTOMS: Do you have any other symptoms? (e.g., chest pain, back pain, breathing difficulty, swelling, rash, fever, numbness, weakness)     No  Protocols used: Leg Pain-A-AH

## 2024-11-01 ENCOUNTER — Other Ambulatory Visit: Payer: Self-pay

## 2024-11-02 ENCOUNTER — Other Ambulatory Visit: Payer: Self-pay

## 2024-11-02 ENCOUNTER — Ambulatory Visit

## 2024-11-02 VITALS — BP 111/89 | HR 82 | Ht 66.0 in | Wt 175.4 lb

## 2024-11-02 DIAGNOSIS — G629 Polyneuropathy, unspecified: Secondary | ICD-10-CM | POA: Diagnosis not present

## 2024-11-02 DIAGNOSIS — F331 Major depressive disorder, recurrent, moderate: Secondary | ICD-10-CM

## 2024-11-02 DIAGNOSIS — F411 Generalized anxiety disorder: Secondary | ICD-10-CM

## 2024-11-02 MED ORDER — ESCITALOPRAM OXALATE 10 MG PO TABS
10.0000 mg | ORAL_TABLET | Freq: Every day | ORAL | 3 refills | Status: AC
  Filled 2024-11-02: qty 90, 90d supply, fill #0

## 2024-11-02 MED ORDER — PREGABALIN 25 MG PO CAPS
25.0000 mg | ORAL_CAPSULE | Freq: Every evening | ORAL | 3 refills | Status: AC
  Filled 2024-11-02: qty 30, 30d supply, fill #0

## 2024-11-02 NOTE — Progress Notes (Signed)
 "     Established patient visit   Patient: Patrick Short   DOB: November 17, 1975   49 y.o. Male  MRN: [id number] Visit Date: 11/02/2024  Today's healthcare provider: Isaiah DELENA Pepper, MD   Chief Complaint  Patient presents with   Medication Refill    Pt states he is taking Lexapro  5mg  daily but would like to increase to 10mg  daily.Pt also requested refill of Klonopin ; unable to see on current rx regimen. Pt states he has been out for a while.    Medication Problem    Pt states he was taking gabapentin but he does not like side effects and would like to discuss alternative tx plans.    Anxiety   Depression   Neuropathy    L leg worsening   Subjective    HPI  Discussed the use of AI scribe software for clinical note transcription with the patient, who gave verbal consent to proceed.  History of Present Illness Patrick Short is a 49 year old male who presents with neuropathy and medication management issues.  He has severe neuropathy in his legs. He believes his nerve damage is due to injuries sustained while in the [employer], where he received shots in both legs. He has tried gabapentin in the past, which he took at night, but discontinued due to side effects such as feeling groggy the next morning. He has not tried Lyrica  before.  He has been on Lexapro  for a couple of years, currently taking a 5 mg dose. He was previously on a 10 mg dose. He has had difficulty maintaining his medication regimen due to a recent six-month incarceration, during which he did not have access to all his medications.  He has not been taking Requip  or Klonopin  regularly. He recently obtained Requip  and prazosin . He has some Klonopin  left, which he uses on an as-needed basis for anxiety, but has not been prescribed it since March. He is currently not taking trazodone , although he mentions sleep issues, stating 'I don't sleep very well.'  He has a history of back injuries and that he experienced a fracture in the  past.   Medications: Show/hide medication list[1]  Review of Systems as noted in HPI.      Objective    BP 111/89 (BP Location: Left Arm, Patient Position: Sitting, Cuff Size: Large)   Pulse 82   Ht 5' 6 (1.676 m)   Wt 175 lb 6.4 oz (79.6 kg)   SpO2 96%   BMI 28.31 kg/m    Physical Exam Constitutional:      Appearance: Normal appearance.  HENT:     Head: Normocephalic and atraumatic.     Mouth/Throat:     Mouth: Mucous membranes are moist.  Eyes:     Pupils: Pupils are equal, round, and reactive to light.  Pulmonary:     Effort: Pulmonary effort is normal.  Skin:    General: Skin is warm.  Neurological:     General: No focal deficit present.     Mental Status: He is alert.      No results found for any visits on 11/02/24.  Assessment & Plan     Problem List Items Addressed This Visit   None Visit Diagnoses       Neuropathy    -  Primary   Relevant Medications   pregabalin  (LYRICA ) 25 MG capsule     Generalized anxiety disorder       Relevant Medications   escitalopram  (LEXAPRO ) 10  MG tablet     Depression, major, recurrent, moderate (HCC)       Relevant Medications   escitalopram  (LEXAPRO ) 10 MG tablet      Assessment & Plan Polyneuropathy Chronic polyneuropathy, uncontrolled. Likely related to previous back injury. Patient would like to avoid gabapentin. Lyrica  may be better tolerated. Will start at a low dose. - Prescribed Lyrica  25 mg nightly to manage neuropathic pain. - Follow up with PCP  Major depressive disorder, recurrent, moderate (HCC) Currently on 5 mg Lexapro . Difficulty managing symptoms, desires dosage increase. Inconsistent adherence due to recent incarceration. - Increased Lexapro  to 10 mg daily. Advised to double current 5 mg tablets until new prescription is filled.  Generalized anxiety disorder Managed with Klonopin  as needed. Inconsistent use due to recent incarceration. Limited supply remaining. - Recommended follow-up  with primary care provider, Dr. Lang, to reassess anxiety management and Klonopin  prescription.   Return in about 4 weeks (around 11/30/2024) for Follow Up with Dr. Lang (PCP).       Isaiah DELENA Pepper, MD  [facility] [phone] (phone) [phone] (fax)     [1]  Outpatient Medications Prior to Visit  Medication Sig   apixaban  (ELIQUIS ) 5 MG TABS tablet Take 1 tablet (5 mg total) by mouth 2 (two) times daily.   diltiazem  (CARDIZEM  CD) 240 MG 24 hr capsule Take 1 capsule (240 mg total) by mouth daily.   EPINEPHrine  0.3 mg/0.3 mL IJ SOAJ injection Inject 0.3 mg into the muscle as needed for anaphylaxis.   prazosin  (MINIPRESS ) 1 MG capsule Take 1 capsule (1 mg total) by mouth at bedtime.   rOPINIRole  (REQUIP ) 1 MG tablet Take 1 tablet (1 mg total) by mouth at bedtime.   Tiotropium Bromide-Olodaterol (STIOLTO RESPIMAT ) 2.5-2.5 MCG/ACT AERS Inhale 2 puffs into the lungs daily.   traZODone  (DESYREL ) 100 MG tablet Take 1 tablet (100 mg total) by mouth at bedtime as needed. for sleep   [DISCONTINUED] escitalopram  (LEXAPRO ) 5 MG tablet Take 1 tablet (5 mg total) by mouth daily.   No facility-administered medications prior to visit.   "

## 2024-11-09 ENCOUNTER — Ambulatory Visit: Admitting: Internal Medicine

## 2024-11-09 NOTE — Progress Notes (Unsigned)
 No show

## 2024-11-10 ENCOUNTER — Telehealth: Payer: Self-pay

## 2024-11-10 ENCOUNTER — Encounter: Payer: Self-pay | Admitting: Student

## 2024-11-10 ENCOUNTER — Ambulatory Visit: Admitting: Student

## 2024-11-10 VITALS — BP 120/78 | HR 60 | Ht 67.0 in | Wt 184.2 lb

## 2024-11-10 DIAGNOSIS — F4310 Post-traumatic stress disorder, unspecified: Secondary | ICD-10-CM

## 2024-11-10 DIAGNOSIS — I480 Paroxysmal atrial fibrillation: Secondary | ICD-10-CM | POA: Insufficient documentation

## 2024-11-10 DIAGNOSIS — I428 Other cardiomyopathies: Secondary | ICD-10-CM | POA: Diagnosis present

## 2024-11-10 DIAGNOSIS — F419 Anxiety disorder, unspecified: Secondary | ICD-10-CM

## 2024-11-10 DIAGNOSIS — F5104 Psychophysiologic insomnia: Secondary | ICD-10-CM

## 2024-11-10 DIAGNOSIS — I10 Essential (primary) hypertension: Secondary | ICD-10-CM | POA: Diagnosis present

## 2024-11-10 DIAGNOSIS — F321 Major depressive disorder, single episode, moderate: Secondary | ICD-10-CM

## 2024-11-10 NOTE — Telephone Encounter (Signed)
 Copied from CRM #[id number]. Topic: Referral - Request for Referral >> Nov 10, 2024  1:57 PM Berwyn MATSU wrote: Did the patient discuss referral with their provider in the last year? Yes (If No - schedule appointment) (If Yes - send message)  Appointment offered? Yes  Type of order/referral and detailed reason for visit: [facility]   Preference of office, provider, location: [facility]  [address], [place], [place] [address] Open 24 hours [phone]  If referral order, have you been seen by this specialty before? Yes one across the street from Hospital  (If Yes, this issue or another issue? When? Where?  Can we respond through MyChart? No

## 2024-11-10 NOTE — Progress Notes (Signed)
 "  Cardiology Clinic Note   Date: 11/10/2024 ID: Patrick Short, DOB Sep 06, 1976, MRN [id number]  [facility] Providers Cardiologist:  Lonni Hanson, MD Cardiology APP:  Nellene Quita [place]UN[place]ERS, [place]  Electrophysiologist:  Will Gladis Norton, MD     Chief Complaint   Patrick Short is a 49 y.o. male who presents to the clinic today for routine follow up.   Patient Profile   Patrick Short is followed by Dr. Hanson and Dr. Norton for the history outlined below.      Past medical history significant for: PAF. A-fib ablation 10/21/2021. Cardiomyopathy. Echo 12/23/2023: EF 50 to 55%.  No RWMA.  Normal diastolic parameters.  Normal global strain.  Normal RV size/function.  Mild LAE.  Mild MR. Hypertension. Asthma. Former tobacco abuse. Quit 1.5 years ago.  PTSD.  In summary, patient was first evaluated by Dr. Hanson on 07/03/2021 for A-fib.  Patient reported history of A-fib and had been placed on anticoagulation in 2008 but was no longer taking it.  He presented to [facility] ED on 06/18/2021 with palpitations, lightheadedness, diaphoresis and was noted to be in sinus rhythm at that time.  When he followed up with PCP he was noted to be in A-fib and placed on anticoagulation.  7-day ZIO demonstrated 26% A-fib burden with longest episode 1 hour 17 minutes, A-fib HR 69 to 224 bpm with average 129 bpm.  Echo demonstrated EF 50 to 55%, normal diastolic parameters, normal global strain, normal RV size/function, mild MR.  He was evaluated by Dr. Cindie on 04/30/2021 and decision was made to proceed with ablation.  Preablation TEE demonstrated LVEF 40 to 45%.  Patient underwent A-fib ablation on 11/01/2021.  Repeat echo August 2023 demonstrated EF 55 to 60%. In January 2024 he was seen by A-fib clinic complaint of 3 symptomatic episodes of A-fib with EMS being called for 1 episode noting his heart rate in the 160s.  He declined going to the ED A-fib spontaneously converted.  He was started on flecainide .  Exercise stress  test showed good exercise tolerance with no diagnostic ST changes.  He did not tolerate flecainide  due to hot cold flashes that improved after coming off the medication.  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  Therefore metoprolol  was discontinued and he was started on diltiazem .  He followed up with EP in August 2024 and felt A-fib was under good control with minimal episodes since starting diltiazem .  Patient was last seen in the office by Dr. Hanson on 08/28/2023 for routine follow-up.  He was doing well overall.  He was also seen by A-fib clinic in February 2025 and reported minimal episodes of tachypalpitations.     History of Present Illness    Today, patient is here alone. Patient denies shortness of breath, dyspnea on exertion, orthopnea or PND. He reports occasional lower extremity edema that is not present in the morning and progresses throughout the day. No chest pain, pressure, or tightness. No palpitations. He has not felt like he has been in afib since his last allergic reaction over a year ago. He reports walking a lot at his job. He otherwise does not participate in routine exercise.      ROS: All other systems reviewed and are otherwise negative except as noted in History of Present Illness.  EKGs/Labs Reviewed    EKG Interpretation Date/Time:  Thursday November 10 2024  15:33:40 EST Ventricular Rate:  60 PR Interval:  158 QRS Duration:  82 QT Interval:  402 QTC Calculation: 402 R Axis:   5  Text Interpretation: Normal sinus rhythm Nonspecific T wave abnormality When compared with ECG of 10-Dec-2023 14:18, Premature atrial complexes are no longer Present Confirmed by Loistine Sober [phone]) on 11/10/2024 3:36:12 PM   01/11/2024: ALT 18; AST 18; BUN 10; Creatinine, Ser 1.04; Potassium 4.2; Sodium 142   01/11/2024: Hemoglobin 15.0; WBC 10.4   Risk  Assessment/Calculations     CHA2DS2-VASc Score = 2   This indicates a 2.2% annual risk of stroke. The patient's score is based upon: CHF History: 1 HTN History: 1 Diabetes History: 0 Stroke History: 0 Vascular Disease History: 0 Age Score: 0 Gender Score: 0             Physical Exam    VS:  BP 120/78 (BP Location: Left Arm, Patient Position: Sitting, Cuff Size: Normal)   Pulse 60   Ht 5' 7 (1.702 m)   Wt 184 lb 3.2 oz (83.6 kg)   SpO2 94%   BMI 28.85 kg/m  , BMI Body mass index is 28.85 kg/m.  GEN: Well nourished, well developed, in no acute distress. Neck: No JVD or carotid bruits. Cardiac:  RRR.  No murmur. No rubs or gallops.   Respiratory:  Respirations regular and unlabored. Clear to auscultation without rales, wheezing or rhonchi. GI: Soft, nontender, nondistended. Extremities: Radials/DP/PT 2+ and equal bilaterally. No clubbing or cyanosis. No edema   Skin: Warm and dry, no rash. Neuro: Strength intact.  Assessment & Plan   PAF S/p A-fib ablation January 2023.  Previously intolerant to flecainide .  Beta-blocker changed to calcium channel blocker secondary to history of anaphylaxis and concern for blunting of epinephrine  fracture with long-term use of beta-blocker.  Denies spontaneous bleeding concerns.  Patient denies palpitations. He has not been in afib since his last allergic reaction over a year ago. EKG shows NSR 60 bpm.  - Continue diltiazem , Eliquis . - Continue to follow with EP.   Cardiomyopathy Echo March 2025 demonstrated EF 50 to 55%, normal diastolic parameters, normal global strain, normal RV size/function, mild LAE, mild MR.  Patient denies shortness of breath, orthopnea or PND. He will experience occasional lower extremity edema that is not present in the morning and progresses throughout the day. Euvolemic and well compensated on exam.  - Repeat echo as clinically indicated.  Hypertension BP today 120/78. No report of headaches or dizziness.   - Continue diltiazem .   Disposition: Return in 1 year or sooner as needed.          Signed, Sober HERO. Calianne Larue, DNP, NP-C  "

## 2024-11-10 NOTE — Patient Instructions (Signed)
 Medication Instructions:  Your physician recommends that you continue on your current medications as directed. Please refer to the Current Medication list given to you today.  *If you need a refill on your cardiac medications before your next appointment, please call your pharmacy*  Lab Work: No labs ordered today  If you have labs (blood work) drawn today and your tests are completely normal, you will receive your results only by: MyChart Message (if you have MyChart) OR A paper copy in the mail If you have any lab test that is abnormal or we need to change your treatment, we will call you to review the results.  Testing/Procedures: No test ordered today   Follow-Up: At [facility], you and your health needs are our priority.  As part of our continuing mission to provide you with exceptional heart care, our providers are all part of one team.  This team includes your primary Cardiologist (physician) and Advanced Practice Providers or APPs (Physician Assistants and Nurse Practitioners) who all work together to provide you with the care you need, when you need it.  Your next appointment:   1 year(s)  Provider:   You may see Sammy Crisp, MD or one of the following Advanced Practice Providers on your designated Care Team:   Laneta Pintos, NP Gildardo Labrador, PA-C Varney Gentleman, PA-C Cadence [place], PA-C Ronald Cockayne, NP Morey Ar, NP    We recommend signing up for the patient portal called "MyChart".  Sign up information is provided on this After Visit Summary.  MyChart is used to connect with patients for Virtual Visits (Telemedicine).  Patients are able to view lab/test results, encounter notes, upcoming appointments, etc.  Non-urgent messages can be sent to your provider as well.   To learn more about what you can do with MyChart, go to [email or web address].

## 2024-11-10 NOTE — Telephone Encounter (Signed)
 Referral placed

## 2024-11-11 ENCOUNTER — Telehealth: Payer: Self-pay | Admitting: Family Medicine

## 2024-11-11 NOTE — Telephone Encounter (Signed)
 Sharma Coyer, MD    11/11/24 11:14 AM Note Please notify patient that he will not need a referral for [facility] as requested.    They can take patient as walk in on Monday through Friday 8A to 2P

## 2024-11-11 NOTE — Telephone Encounter (Signed)
 Please notify patient that he will not need a referral for [facility] as requested.   They can take patient as walk in on Monday through Friday 8A to 2P

## 2024-11-11 NOTE — Telephone Encounter (Signed)
 Duplicate. Closing encounter.

## 2024-11-11 NOTE — Telephone Encounter (Signed)
 Pt advised & verbalized understanding

## 2024-12-02 ENCOUNTER — Ambulatory Visit: Admitting: Student

## 2024-12-20 ENCOUNTER — Ambulatory Visit: Admitting: Family Medicine
